# Patient Record
Sex: Male | Born: 1965 | Race: Black or African American | Hispanic: No | Marital: Single | Smoking: Former smoker
Health system: Southern US, Community
[De-identification: ages and names within clinical notes are randomized; demographics above are authoritative.]

## PROBLEM LIST (undated history)

## (undated) DIAGNOSIS — M1990 Unspecified osteoarthritis, unspecified site: Secondary | ICD-10-CM

## (undated) DIAGNOSIS — R519 Headache, unspecified: Secondary | ICD-10-CM

## (undated) DIAGNOSIS — G4730 Sleep apnea, unspecified: Secondary | ICD-10-CM

## (undated) DIAGNOSIS — R011 Cardiac murmur, unspecified: Secondary | ICD-10-CM

## (undated) DIAGNOSIS — D126 Benign neoplasm of colon, unspecified: Secondary | ICD-10-CM

## (undated) DIAGNOSIS — K429 Umbilical hernia without obstruction or gangrene: Secondary | ICD-10-CM

## (undated) DIAGNOSIS — I2121 ST elevation (STEMI) myocardial infarction involving left circumflex coronary artery: Secondary | ICD-10-CM

## (undated) DIAGNOSIS — J45909 Unspecified asthma, uncomplicated: Secondary | ICD-10-CM

## (undated) DIAGNOSIS — I2510 Atherosclerotic heart disease of native coronary artery without angina pectoris: Secondary | ICD-10-CM

## (undated) DIAGNOSIS — T7840XA Allergy, unspecified, initial encounter: Secondary | ICD-10-CM

## (undated) DIAGNOSIS — R51 Headache: Secondary | ICD-10-CM

## (undated) DIAGNOSIS — R0600 Dyspnea, unspecified: Secondary | ICD-10-CM

## (undated) DIAGNOSIS — I10 Essential (primary) hypertension: Secondary | ICD-10-CM

## (undated) DIAGNOSIS — E785 Hyperlipidemia, unspecified: Secondary | ICD-10-CM

## (undated) HISTORY — DX: Umbilical hernia without obstruction or gangrene: K42.9

## (undated) HISTORY — DX: Allergy, unspecified, initial encounter: T78.40XA

## (undated) HISTORY — DX: ST elevation (STEMI) myocardial infarction involving left circumflex coronary artery: I21.21

## (undated) HISTORY — DX: Hyperlipidemia, unspecified: E78.5

---
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
## 2001-12-20 HISTORY — PX: UMBILICAL HERNIA REPAIR: SHX196

## 2002-04-11 ENCOUNTER — Ambulatory Visit (HOSPITAL_COMMUNITY): Admission: RE | Admit: 2002-04-11 | Discharge: 2002-04-11 | Payer: Self-pay | Admitting: *Deleted

## 2003-07-30 ENCOUNTER — Emergency Department (HOSPITAL_COMMUNITY): Admission: EM | Admit: 2003-07-30 | Discharge: 2003-07-30 | Payer: Self-pay | Admitting: Emergency Medicine

## 2004-08-05 ENCOUNTER — Emergency Department (HOSPITAL_COMMUNITY): Admission: EM | Admit: 2004-08-05 | Discharge: 2004-08-06 | Payer: Self-pay | Admitting: Family Medicine

## 2007-05-14 ENCOUNTER — Emergency Department (HOSPITAL_COMMUNITY): Admission: EM | Admit: 2007-05-14 | Discharge: 2007-05-14 | Payer: Self-pay | Admitting: Emergency Medicine

## 2011-05-07 NOTE — Op Note (Signed)
[facility]  Patient:    Kenneth Long, Kenneth Long Visit Number: [id number] MRN: [id number]          Service Type: DSU Location: DAY Attending Physician:  Vikki Ports. Dictated by:   Vikki Ports, M.D. Proc. Date: 04/11/02 Admit Date:  04/11/2002                             Operative Report  PREOPERATIVE DIAGNOSIS:  Umbilical hernia.  POSTOPERATIVE DIAGNOSIS:  Umbilical hernia.  PROCEDURE:  Umbilical hernia repair.  SURGEON:  Vikki Ports, M.D.  ANESTHESIA:  General.  DESCRIPTION OF PROCEDURE:  The patient was taken to the operating room, placed in a supine position. After adequate anesthesia was induced using a laryngeal mask, the abdomen was prepped and draped in normal sterile fashion. Using a supraumbilical transverse incision, I dissected down through the skin, subcutaneous tissue onto a hernia sac. It was mobilized. Fascial edges were identified. The defect was less than 1 cm and was closed with interrupted 0 Surgilon sutures. The skin was closed with 4-0 subcuticular Monocryl. Steri-Strips and sterile dressings were applied. The patient tolerated the procedure well and went to PACU in good condition. Dictated by:   Vikki Ports, M.D. Attending Physician:  Danna Hefty R. DD:  04/11/02 TD:  04/11/02 Job: [id number]

## 2011-06-29 ENCOUNTER — Emergency Department (HOSPITAL_COMMUNITY)
Admission: EM | Admit: 2011-06-29 | Discharge: 2011-06-29 | Disposition: A | Discharge status: Home / Self Care | Payer: Self-pay | Attending: Emergency Medicine | Admitting: Emergency Medicine

## 2011-06-29 ENCOUNTER — Emergency Department (HOSPITAL_COMMUNITY): Payer: Self-pay

## 2011-06-29 DIAGNOSIS — M79609 Pain in unspecified limb: Secondary | ICD-10-CM | POA: Insufficient documentation

## 2011-06-29 DIAGNOSIS — I10 Essential (primary) hypertension: Secondary | ICD-10-CM | POA: Insufficient documentation

## 2011-12-30 ENCOUNTER — Encounter (HOSPITAL_COMMUNITY): Payer: Self-pay

## 2011-12-30 ENCOUNTER — Emergency Department (HOSPITAL_COMMUNITY)
Admission: EM | Admit: 2011-12-30 | Discharge: 2011-12-30 | Disposition: A | Discharge status: Home / Self Care | Payer: No Typology Code available for payment source | Attending: Emergency Medicine | Admitting: Emergency Medicine

## 2011-12-30 ENCOUNTER — Emergency Department (HOSPITAL_COMMUNITY): Payer: No Typology Code available for payment source

## 2011-12-30 DIAGNOSIS — S4980XA Other specified injuries of shoulder and upper arm, unspecified arm, initial encounter: Secondary | ICD-10-CM | POA: Insufficient documentation

## 2011-12-30 DIAGNOSIS — M25539 Pain in unspecified wrist: Secondary | ICD-10-CM | POA: Insufficient documentation

## 2011-12-30 DIAGNOSIS — S63509A Unspecified sprain of unspecified wrist, initial encounter: Secondary | ICD-10-CM | POA: Insufficient documentation

## 2011-12-30 DIAGNOSIS — S46909A Unspecified injury of unspecified muscle, fascia and tendon at shoulder and upper arm level, unspecified arm, initial encounter: Secondary | ICD-10-CM | POA: Insufficient documentation

## 2011-12-30 DIAGNOSIS — S63502A Unspecified sprain of left wrist, initial encounter: Secondary | ICD-10-CM

## 2011-12-30 DIAGNOSIS — M25529 Pain in unspecified elbow: Secondary | ICD-10-CM | POA: Insufficient documentation

## 2011-12-30 DIAGNOSIS — S63501A Unspecified sprain of right wrist, initial encounter: Secondary | ICD-10-CM

## 2011-12-30 DIAGNOSIS — I10 Essential (primary) hypertension: Secondary | ICD-10-CM | POA: Insufficient documentation

## 2011-12-30 DIAGNOSIS — S4991XA Unspecified injury of right shoulder and upper arm, initial encounter: Secondary | ICD-10-CM

## 2011-12-30 HISTORY — DX: Essential (primary) hypertension: I10

## 2011-12-30 MED ORDER — NAPROXEN 500 MG PO TABS: 500.0000 mg | ORAL_TABLET | Freq: Two times a day (BID) | ORAL | Status: AC

## 2011-12-30 MED ORDER — HYDROCODONE-ACETAMINOPHEN 5-325 MG PO TABS
1.0000 | ORAL_TABLET | Freq: Four times a day (QID) | ORAL | Status: AC | PRN
Start: 2011-12-30 — End: 2012-01-09
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
## 2012-05-20 ENCOUNTER — Emergency Department (HOSPITAL_COMMUNITY)
Admission: EM | Admit: 2012-05-20 | Discharge: 2012-05-20 | Disposition: A | Discharge status: Home / Self Care | Payer: Self-pay | Source: Home / Self Care | Attending: Emergency Medicine | Admitting: Emergency Medicine

## 2012-05-20 ENCOUNTER — Encounter (HOSPITAL_COMMUNITY): Payer: Self-pay | Admitting: Emergency Medicine

## 2012-05-20 DIAGNOSIS — H109 Unspecified conjunctivitis: Secondary | ICD-10-CM

## 2012-05-20 DIAGNOSIS — I10 Essential (primary) hypertension: Secondary | ICD-10-CM

## 2012-05-20 LAB — POCT I-STAT, CHEM 8
BUN: 18 mg/dL (ref 6–23)
Calcium, Ion: 1.19 mmol/L (ref 1.12–1.32)
Creatinine, Ser: 0.9 mg/dL (ref 0.50–1.35)
Glucose, Bld: 103 mg/dL — ABNORMAL HIGH (ref 70–99)
Hemoglobin: 15 g/dL (ref 13.0–17.0)
TCO2: 23 mmol/L (ref 0–100)

## 2012-05-20 MED ORDER — FUROSEMIDE 40 MG PO TABS
ORAL_TABLET | ORAL | Status: AC
  Filled 2012-05-20: qty 1

## 2012-05-20 MED ORDER — CLONIDINE HCL 0.1 MG PO TABS
ORAL_TABLET | ORAL | Status: AC
  Filled 2012-05-20: qty 2

## 2012-05-20 MED ORDER — CLONIDINE HCL 0.1 MG PO TABS
0.2000 mg | ORAL_TABLET | Freq: Once | ORAL | Status: AC
  Administered 2012-05-20: 0.2 mg via ORAL

## 2012-05-20 MED ORDER — POLYMYXIN B-TRIMETHOPRIM 10000-0.1 UNIT/ML-% OP SOLN: 1.0000 [drp] | OPHTHALMIC | Status: AC

## 2012-05-20 MED ORDER — TETRACAINE HCL 0.5 % OP SOLN
1.0000 [drp] | Freq: Once | OPHTHALMIC | Status: AC
  Administered 2012-05-20: 1 [drp] via OPHTHALMIC

## 2012-05-20 MED ORDER — CHLORTHALIDONE 25 MG PO TABS: 25.0000 mg | ORAL_TABLET | Freq: Every day | ORAL | Status: DC

## 2012-05-20 MED ORDER — ERYTHROMYCIN 5 MG/GM OP OINT: TOPICAL_OINTMENT | OPHTHALMIC | Status: AC

## 2012-05-20 MED ORDER — FUROSEMIDE 40 MG PO TABS
40.0000 mg | ORAL_TABLET | Freq: Once | ORAL | Status: AC
  Administered 2012-05-20: 40 mg via ORAL

## 2012-05-20 MED ORDER — CLONIDINE HCL 0.2 MG PO TABS: 0.2000 mg | ORAL_TABLET | Freq: Two times a day (BID) | ORAL | Status: DC

## 2012-05-20 MED ORDER — TETRACAINE HCL 0.5 % OP SOLN
OPHTHALMIC | Status: AC
  Filled 2012-05-20: qty 2

## 2012-05-20 MED ORDER — FUROSEMIDE 10 MG/ML IJ SOLN: 40.0000 mg | Freq: Once | INTRAMUSCULAR | Status: DC

## 2012-05-20 MED ORDER — CEPHALEXIN 500 MG PO CAPS: 500.0000 mg | ORAL_CAPSULE | Freq: Three times a day (TID) | ORAL | Status: AC

## 2012-05-20 NOTE — ED Notes (Signed)
Spoke with dr Lorenz Coaster, partial scripts went to [facility], part of scripts  to [facility].  Called in remaining 3 scripts to [facility] pharmacy [facility]-cephalexin, erythromycin, and trimethoprim-polymyxin ophthalmic solution.  Spoke with pharmacist frank burton.

## 2012-05-20 NOTE — ED Notes (Signed)
Eye box and equipment at bedside

## 2012-05-20 NOTE — ED Notes (Signed)
Verified order for lasix, route is "po"

## 2012-05-20 NOTE — Discharge Instructions (Signed)
Arterial Hypertension Arterial hypertension (high blood pressure) is a condition of elevated pressure in your blood vessels. Hypertension over a long period of time is a risk factor for strokes, heart attacks, and heart failure. It is also the leading cause of kidney (renal) failure.  CAUSES   In Adults -- Over 90% of all hypertension has no known cause. This is called essential or primary hypertension. In the other 10% of people with hypertension, the increase in blood pressure is caused by another disorder. This is called secondary hypertension. Important causes of secondary hypertension are:   Heavy alcohol use.   Obstructive sleep apnea.   Hyperaldosterosim (Conn's syndrome).   Steroid use.   Chronic kidney failure.   Hyperparathyroidism.   Medications.   Renal artery stenosis.   Pheochromocytoma.   Cushing's disease.   Coarctation of the aorta.   Scleroderma renal crisis.   Licorice (in excessive amounts).   Drugs (cocaine, methamphetamine).  Your caregiver can explain any items above that apply to you.  In Children -- Secondary hypertension is more common and should always be considered.   Pregnancy -- Few women of childbearing age have high blood pressure. However, up to 10% of them develop hypertension of pregnancy. Generally, this will not harm the woman. It Even be a sign of 3 complications of pregnancy: preeclampsia, HELLP syndrome, and eclampsia. Follow up and control with medication is necessary.  SYMPTOMS   This condition normally does not produce any noticeable symptoms. It is usually found during a routine exam.   Malignant hypertension is a late problem of high blood pressure. It Monger have the following symptoms:   Headaches.   Blurred vision.   End-organ damage (this means your kidneys, heart, lungs, and other organs are being damaged).   Stressful situations can increase the blood pressure. If a person with normal blood pressure has their blood  pressure go up while being seen by their caregiver, this is often termed "white coat hypertension." Its importance is not known. It Alkire be related with eventually developing hypertension or complications of hypertension.   Hypertension is often confused with mental tension, stress, and anxiety.  DIAGNOSIS  The diagnosis is made by 3 separate blood pressure measurements. They are taken at least 1 week apart from each other. If there is organ damage from hypertension, the diagnosis Lemire be made without repeat measurements. Hypertension is usually identified by having blood pressure readings:  Above 140/90 mmHg measured in both arms, at 3 separate times, over a couple weeks.   Over 130/80 mmHg should be considered a risk factor and Grisanti require treatment in patients with diabetes.  Blood pressure readings over 120/80 mmHg are called "pre-hypertension" even in non-diabetic patients. To get a true blood pressure measurement, use the following guidelines. Be aware of the factors that can alter blood pressure readings.  Take measurements at least 1 hour after caffeine.   Take measurements 30 minutes after smoking and without any stress. This is another reason to quit smoking - it raises your blood pressure.   Use a proper cuff size. Ask your caregiver if you are not sure about your cuff size.   Most home blood pressure cuffs are automatic. They will measure systolic and diastolic pressures. The systolic pressure is the pressure reading at the start of sounds. Diastolic pressure is the pressure at which the sounds disappear. If you are elderly, measure pressures in multiple postures. Try sitting, lying or standing.   Sit at rest for a minimum of   5 minutes before taking measurements.   You should not be on any medications like decongestants. These are found in many cold medications.   Record your blood pressure readings and review them with your caregiver.  If you have hypertension:  Your caregiver  may do tests to be sure you do not have secondary hypertension (see "causes" above).   Your caregiver may also look for signs of metabolic syndrome. This is also called Syndrome X or Insulin Resistance Syndrome. You may have this syndrome if you have type 2 diabetes, abdominal obesity, and abnormal blood lipids in addition to hypertension.   Your caregiver will take your medical and family history and perform a physical exam.   Diagnostic tests may include blood tests (for glucose, cholesterol, potassium, and kidney function), a urinalysis, or an EKG. Other tests may also be necessary depending on your condition.  PREVENTION  There are important lifestyle issues that you can adopt to reduce your chance of developing hypertension:  Maintain a normal weight.   Limit the amount of salt (sodium) in your diet.   Exercise often.   Limit alcohol intake.   Get enough potassium in your diet. Discuss specific advice with your caregiver.   Follow a DASH diet (dietary approaches to stop hypertension). This diet is rich in fruits, vegetables, and low-fat dairy products, and avoids certain fats.  PROGNOSIS  Essential hypertension cannot be cured. Lifestyle changes and medical treatment can lower blood pressure and reduce complications. The prognosis of secondary hypertension depends on the underlying cause. Many people whose hypertension is controlled with medicine or lifestyle changes can live a normal, healthy life.  RISKS AND COMPLICATIONS  While high blood pressure alone is not an illness, it often requires treatment due to its short- and long-term effects on many organs. Hypertension increases your risk for:  CVAs or strokes (cerebrovascular accident).   Heart failure due to chronically high blood pressure (hypertensive cardiomyopathy).   Heart attack (myocardial infarction).   Damage to the retina (hypertensive retinopathy).   Kidney failure (hypertensive nephropathy).  Your caregiver can  explain list items above that apply to you. Treatment of hypertension can significantly reduce the risk of complications. TREATMENT   For overweight patients, weight loss and regular exercise are recommended. Physical fitness lowers blood pressure.   Mild hypertension is usually treated with diet and exercise. A diet rich in fruits and vegetables, fat-free dairy products, and foods low in fat and salt (sodium) can help lower blood pressure. Decreasing salt intake decreases blood pressure in a 1/3 of people.   Stop smoking if you are a smoker.  The steps above are highly effective in reducing blood pressure. While these actions are easy to suggest, they are difficult to achieve. Most patients with moderate or severe hypertension end up requiring medications to bring their blood pressure down to a normal level. There are several classes of medications for treatment. Blood pressure pills (antihypertensives) will lower blood pressure by their different actions. Lowering the blood pressure by 10 mmHg may decrease the risk of complications by as much as 25%. The goal of treatment is effective blood pressure control. This will reduce your risk for complications. Your caregiver will help you determine the best treatment for you according to your lifestyle. What is excellent treatment for one person, may not be for you. HOME CARE INSTRUCTIONS   Do not smoke.   Follow the lifestyle changes outlined in the "Prevention" section.   If you are on medications, follow the directions   carefully. Blood pressure medications must be taken as prescribed. Skipping doses reduces their benefit. It also puts you at risk for problems.   Follow up with your caregiver, as directed.   If you are asked to monitor your blood pressure at home, follow the guidelines in the "Diagnosis" section above.  SEEK MEDICAL CARE IF:   You think you are having medication side effects.   You have recurrent headaches or lightheadedness.     You have swelling in your ankles.   You have trouble with your vision.  SEEK IMMEDIATE MEDICAL CARE IF:   You have sudden onset of chest pain or pressure, difficulty breathing, or other symptoms of a heart attack.   You have a severe headache.   You have symptoms of a stroke (such as sudden weakness, difficulty speaking, difficulty walking).  MAKE SURE YOU:   Understand these instructions.   Will watch your condition.   Will get help right away if you are not doing well or get worse.  Document Released: 12/06/2005 Document Revised: 11/25/2011 Document Reviewed: 07/06/2007 [facility] Patient Information 2012 [place], [place].Conjunctivitis Conjunctivitis is commonly called "pink eye." Conjunctivitis can be caused by bacterial or viral infection, allergies, or injuries. There is usually redness of the lining of the eye, itching, discomfort, and sometimes discharge. There may be deposits of matter along the eyelids. A viral infection usually causes a watery discharge, while a bacterial infection causes a yellowish, thick discharge. Pink eye is very contagious and spreads by direct contact. You may be given antibiotic eyedrops as part of your treatment. Before using your eye medicine, remove all drainage from the eye by washing gently with warm water and cotton balls. Continue to use the medication until you have awakened 2 mornings in a row without discharge from the eye. Do not rub your eye. This increases the irritation and helps spread infection. Use separate towels from other household members. Wash your hands with soap and water before and after touching your eyes. Use cold compresses to reduce pain and sunglasses to relieve irritation from light. Do not wear contact lenses or wear eye makeup until the infection is gone. SEEK MEDICAL CARE IF:   Your symptoms are not better after 3 days of treatment.   You have increased pain or trouble seeing.   The outer eyelids become very red or  swollen.  Document Released: 01/13/2005 Document Revised: 11/25/2011 Document Reviewed: 12/06/2005 [facility] Patient Information 2012 [place], [place].

## 2012-05-20 NOTE — ED Notes (Signed)
Right eye pain, patient reports right eye feels like something is in it. Eye sensitive to light, eye swollen

## 2012-05-20 NOTE — ED Provider Notes (Signed)
Chief Complaint  Patient presents with  . Eye Pain    History of Present Illness:   The patient is a 46 year old male who has had a two-day history of pain, swelling, redness, and discharge from his right eye. He states his vision is somewhat blurry. He thinks it might be starting to affect his left side as well. The upper and lower lids of the right eye are swollen. He denies any diplopia or pain on eye movement. He's had no fever or chills. No nasal congestion, drainage, sore throat, or cough.  He also has high blood pressure which is had for years. He was on medications for this and was seeing a specialist but stopped the medication. He's been off it for several years. He denies any headaches, dizziness, shortness of breath, or chest pain.  Review of Systems:  Other than noted above, the patient denies any of the following symptoms: Systemic:  No fever, chills, sweats, fatigue, or weight loss. Eye:  No redness, eye pain, photophobia, discharge, blurred vision, or diplopia. ENT:  No nasal congestion, rhinorrhea, or sore throat. Lymphatic:  No adenopathy. Skin:  No rash or pruritis.  PMFSH:  Past medical history, family history, social history, meds, and allergies were reviewed.  Physical Exam:   Vital signs:  BP 192/120  Pulse 81  Temp(Src) 98.6 F (37 C) (Oral)  Resp 16  SpO2 100% Filed Vitals:   05/20/12 1752 05/20/12 1829 05/20/12 1855 05/20/12 1955  BP: 201/129 205/121 210/128 192/120  Pulse: 73 81    Temp:      TempSrc:      Resp:      SpO2:       General:  Alert and in no distress. Eye:  There was swelling of the upper and lower lids. These were tender to palpation. No swelling of the periorbital tissues. Conjunctiva was injected and there was a small amount of light yellow exudate present. The cornea was intact to fluorescein staining. Anterior chambers are normal. Funduscopic exam was normal. A Tono-Pen reading was 41 for the right eye and 38 the left eye, although the  confidence level was very low for both of these readings. Visual acuity was 20/20 for the left eye, 20/25 for the right eye, and 20/20 for both eyes. ENT:  TMs and canals clear.  Nasal mucosa normal.  No intra-oral lesions, mucous membranes moist, pharynx clear. Neck:  No adenopathy tenderness or mass. Skin:  Clear, warm and dry.  Results for orders placed during the hospital encounter of 05/20/12  POCT I-STAT, CHEM 8      Component Value Range   Sodium 141  135 - 145 (mEq/L)   Potassium 3.8  3.5 - 5.1 (mEq/L)   Chloride 107  96 - 112 (mEq/L)   BUN 18  6 - 23 (mg/dL)   Creatinine, Ser 1.91  0.50 - 1.35 (mg/dL)   Glucose, Bld 478 (*) 70 - 99 (mg/dL)   Calcium, Ion 2.95  6.21 - 1.32 (mmol/L)   TCO2 23  0 - 100 (mmol/L)   Hemoglobin 15.0  13.0 - 17.0 (g/dL)   HCT 30.8  65.7 - 84.6 (%)     Course in Urgent Care Center:    he was given clonidine 0.2 mg by mouth and furosemide 40 mg by mouth. His blood pressure came down minimally. Thereafter he was given clonidine 0.2 mg again by mouth with some slight decrease in his blood pressure. It was felt that pushing more medicines tonight would  not be productive and it would be better to get him on a regular dose of medication at home and followup with a primary care doctor as soon as possible for his blood pressure.    Assessment:  The primary encounter diagnosis was Conjunctivitis. A diagnosis of Hypertension was also pertinent to this visit.  There is no evidence for orbital cellulitis. He was told however if the eye he should feel worse or wasn't improving in 48 hours to return for recheck. He was told to check in with a primary care doctor as soon as possible, preferably early next week.  Plan:   1.  The following meds were prescribed:   New Prescriptions   CEPHALEXIN (KEFLEX) 500 MG CAPSULE    Take 1 capsule (500 mg total) by mouth 3 (three) times daily.   CHLORTHALIDONE (HYGROTON) 25 MG TABLET    Take 1 tablet (25 mg total) by mouth daily.     CLONIDINE (CATAPRES) 0.2 MG TABLET    Take 1 tablet (0.2 mg total) by mouth 2 (two) times daily.   ERYTHROMYCIN OPHTHALMIC OINTMENT    Apply to right eye at bedtime.   TRIMETHOPRIM-POLYMYXIN B (POLYTRIM) OPHTHALMIC SOLUTION    Place 1 drop into the right eye every 4 (four) hours.   2.  The patient was instructed in symptomatic care and handouts were given. 3.  The patient was told to return if becoming worse in any way, if no better in 3 or 4 days, and given some red flag symptoms that would indicate earlier return.  Follow up:  The patient was told to follow up with Dr. Annamarie Major for possible glaucoma since his Tono-Pen readings were elevated, although the accuracy of the readings is in doubt. He should also followup with her primary care doctor for his blood pressure.     Reuben Likes, MD 05/20/12 2129

## 2012-05-21 NOTE — ED Notes (Signed)
Received message patient's scripts not at pharmacy.  Called and spoke to pharmacy tech.  Instructed pharmacy that scripts called in last night.  Tech acknowledged they were called. Called patient [phone] and informed him of this.  Instructed to call [facility] if any further issues.

## 2012-05-21 NOTE — ED Notes (Signed)
[facility] tech's name is [place].

## 2013-05-09 ENCOUNTER — Emergency Department (HOSPITAL_COMMUNITY)
Admission: EM | Admit: 2013-05-09 | Discharge: 2013-05-09 | Disposition: A | Discharge status: Home / Self Care | Payer: Self-pay | Attending: Emergency Medicine | Admitting: Emergency Medicine

## 2013-05-09 ENCOUNTER — Encounter (HOSPITAL_COMMUNITY): Payer: Self-pay | Admitting: Adult Health

## 2013-05-09 ENCOUNTER — Emergency Department (HOSPITAL_COMMUNITY): Payer: Self-pay

## 2013-05-09 DIAGNOSIS — N451 Epididymitis: Secondary | ICD-10-CM

## 2013-05-09 DIAGNOSIS — N453 Epididymo-orchitis: Secondary | ICD-10-CM | POA: Insufficient documentation

## 2013-05-09 DIAGNOSIS — F17200 Nicotine dependence, unspecified, uncomplicated: Secondary | ICD-10-CM | POA: Insufficient documentation

## 2013-05-09 DIAGNOSIS — I10 Essential (primary) hypertension: Secondary | ICD-10-CM | POA: Insufficient documentation

## 2013-05-09 DIAGNOSIS — R6883 Chills (without fever): Secondary | ICD-10-CM | POA: Insufficient documentation

## 2013-05-09 DIAGNOSIS — Z79899 Other long term (current) drug therapy: Secondary | ICD-10-CM | POA: Insufficient documentation

## 2013-05-09 LAB — URINALYSIS, ROUTINE W REFLEX MICROSCOPIC
Glucose, UA: NEGATIVE mg/dL
Ketones, ur: 15 mg/dL — AB
Protein, ur: 100 mg/dL — AB

## 2013-05-09 LAB — URINE MICROSCOPIC-ADD ON

## 2013-05-09 MED ORDER — CEFTRIAXONE SODIUM 250 MG IJ SOLR
250.0000 mg | Freq: Once | INTRAMUSCULAR | Status: AC
  Administered 2013-05-09: 250 mg via INTRAMUSCULAR
  Filled 2013-05-09: qty 250

## 2013-05-09 MED ORDER — HYDROCODONE-ACETAMINOPHEN 5-325 MG PO TABS
1.0000 | ORAL_TABLET | Freq: Once | ORAL | Status: AC
  Administered 2013-05-09: 1 via ORAL
  Filled 2013-05-09: qty 1

## 2013-05-09 MED ORDER — LIDOCAINE HCL (PF) 1 % IJ SOLN
INTRAMUSCULAR | Status: AC
  Administered 2013-05-09: 1 mL
  Filled 2013-05-09: qty 5

## 2013-05-09 MED ORDER — CIPROFLOXACIN HCL 500 MG PO TABS: 500.0000 mg | ORAL_TABLET | Freq: Two times a day (BID) | ORAL | Status: DC

## 2013-05-09 NOTE — ED Provider Notes (Signed)
History     CSN: [id number]  Arrival date & time 05/09/13  1737   First MD Initiated Contact with Patient 05/09/13 1826      Chief Complaint  Patient presents with  . Groin Swelling    (Consider location/radiation/quality/duration/timing/severity/associated sxs/prior treatment) HPI Comments: 47 y.o. male who has pmh of htn, who presents to the er w/ the cc of left sided testicular pain. Pt states that symptoms started 3 days ago, no n/v. Has had chills. He states that symptoms started when he woke up Sunday morning, woke him up from sleep, sudden onset. He states he has not had symptoms "this bad before", states in the past he has been treated with antibiotics for testicular pain he has had before, but states this time, "its different".   Patient is a 47 y.o. male presenting with general illness. The history is provided by the patient.  Illness Location:  Left testicle  Severity:  Mild Onset quality:  Gradual Timing:  Constant Associated symptoms: no abdominal pain, no diarrhea, no headaches, no nausea, no rash, no shortness of breath and no wheezing     Past Medical History  Diagnosis Date  . Hypertension     History reviewed. No pertinent past surgical history.  History reviewed. No pertinent family history.  History  Substance Use Topics  . Smoking status: Current Every Day Smoker  . Smokeless tobacco: Not on file  . Alcohol Use: Yes      Review of Systems  Constitutional: Negative for chills and activity change.  HENT: Negative for drooling, mouth sores and neck pain.   Eyes: Negative for pain and discharge.  Respiratory: Negative for chest tightness, shortness of breath and wheezing.   Gastrointestinal: Negative for nausea, abdominal pain, diarrhea and constipation.  Genitourinary: Positive for testicular pain. Negative for dysuria, flank pain and difficulty urinating.  Musculoskeletal: Negative for back pain and arthralgias.  Skin: Negative for color change,  pallor and rash.  Neurological: Negative for dizziness, weakness, light-headedness and headaches.  Psychiatric/Behavioral: Negative for behavioral problems, confusion and agitation.    Allergies  Peanut-containing drug products and Shellfish allergy  Home Medications   Current Outpatient Rx  Name  Route  Sig  Dispense  Refill  . chlorthalidone (HYGROTON) 25 MG tablet   Oral   Take 1 tablet (25 mg total) by mouth daily.   30 tablet   0   . cloNIDine (CATAPRES) 0.2 MG tablet   Oral   Take 1 tablet (0.2 mg total) by mouth 2 (two) times daily.   60 tablet   0   . naphazoline (CLEAR EYES) 0.012 % ophthalmic solution      1 drop 4 (four) times daily.         [name] OVER THE COUNTER MEDICATION   Oral   Take 2 tablets by mouth 3 (three) times daily as needed. As needed for pain. Pain reliever.           BP 133/83  Pulse 96  Temp(Src) 98.2 F (36.8 C) (Oral)  Resp 18  SpO2 98%  Physical Exam  Constitutional: He is oriented to person, place, and time. He appears well-developed. No distress.  HENT:  Head: Normocephalic.  Eyes: Pupils are equal, round, and reactive to light. Right eye exhibits no discharge. Left eye exhibits no discharge.  Neck: Neck supple. No tracheal deviation present.  Cardiovascular: Regular rhythm and intact distal pulses.   Pulmonary/Chest: Effort normal. No stridor. No respiratory distress. He has no wheezes.  Abdominal: Soft. He exhibits no distension. There is no tenderness. There is no rebound.  Genitourinary:  Left testicle is lower than right, swollen, ttp. Right testicle is not tender to palpation. Prostate is not tender to palpation.   Musculoskeletal: Normal range of motion. He exhibits no tenderness.  Neurological: He is alert and oriented to person, place, and time. No cranial nerve deficit.  Skin: Skin is warm. No rash noted. He is not diaphoretic. No erythema.    ED Course  Procedures (including critical care time)  Labs Reviewed   URINALYSIS, ROUTINE W REFLEX MICROSCOPIC - Abnormal; Notable for the following:    Color, Urine ORANGE (*)    APPearance TURBID (*)    Hgb urine dipstick LARGE (*)    Bilirubin Urine SMALL (*)    Ketones, ur 15 (*)    Protein, ur 100 (*)    Urobilinogen, UA 4.0 (*)    Leukocytes, UA LARGE (*)    All other components within normal limits  URINE MICROSCOPIC-ADD ON - Abnormal; Notable for the following:    Bacteria, UA MANY (*)    All other components within normal limits  GC/CHLAMYDIA PROBE AMP  URINE CULTURE  GC/CHLAMYDIA PROBE AMP   US Scrotum  05/09/2013   *RADIOLOGY REPORT*  Clinical Data:  Left testicular pain and swelling.  SCROTAL ULTRASOUND DOPPLER ULTRASOUND OF THE TESTICLES  Technique: Complete ultrasound examination of the testicles, epididymis, and other scrotal structures was performed.  Color and spectral Doppler ultrasound were also utilized to evaluate blood flow to the testicles.  Comparison:  None  Findings:  Right testis:  4.1 x 2.2 x 3.0 cm.  No evidence of testicular mass or microlithiasis.  Intratesticular blood flow is seen on color Doppler ultrasound.  Left testis:  3.6 x 2.7 x 2.7 cm.  No evidence of testicular mass or microlithiasis.  Intratesticular blood flow is seen on color Doppler ultrasound.  Right epididymis:  Normal in size and appearance.  Left epididymis:  Shows asymmetric enlargement and increased blood flow on color Doppler ultrasound, consistent with epididymitis.  Hydrocele:  A small to moderate complex hydrocele is seen on the left.  Varicocele:  A moderate-sized varicocele is also seen on the left.  Pulsed Doppler interrogation of both testes demonstrates low resistance flow bilaterally.  IMPRESSION:  1.  No evidence of testicular mass or torsion. 2.  Left sided epididymitis and moderate-sized varicocele. 3.  Small to moderate complex left hydrocele.   Original Report Authenticated By: Myles Rosenthal, M.D.   [place] Art/ven Flow Abd Pelv Doppler  05/09/2013    *RADIOLOGY REPORT*  Clinical Data:  Left testicular pain and swelling.  SCROTAL ULTRASOUND DOPPLER ULTRASOUND OF THE TESTICLES  Technique: Complete ultrasound examination of the testicles, epididymis, and other scrotal structures was performed.  Color and spectral Doppler ultrasound were also utilized to evaluate blood flow to the testicles.  Comparison:  None  Findings:  Right testis:  4.1 x 2.2 x 3.0 cm.  No evidence of testicular mass or microlithiasis.  Intratesticular blood flow is seen on color Doppler ultrasound.  Left testis:  3.6 x 2.7 x 2.7 cm.  No evidence of testicular mass or microlithiasis.  Intratesticular blood flow is seen on color Doppler ultrasound.  Right epididymis:  Normal in size and appearance.  Left epididymis:  Shows asymmetric enlargement and increased blood flow on color Doppler ultrasound, consistent with epididymitis.  Hydrocele:  A small to moderate complex hydrocele is seen on the left.  Varicocele:  A  moderate-sized varicocele is also seen on the left.  Pulsed Doppler interrogation of both testes demonstrates low resistance flow bilaterally.  IMPRESSION:  1.  No evidence of testicular mass or torsion. 2.  Left sided epididymitis and moderate-sized varicocele. 3.  Small to moderate complex left hydrocele.   Original Report Authenticated By: Myles Rosenthal, M.D.     MDM  Concern for torsion vs variocele. Will get U/S to rule out. Symptoms present for more than 3 full days now. No fevers or chills, no abdominal pain.  Will also get U/A and gc urine.   Pt is found to have epididymitis -- he does not have torsion.   Will tx pt with cipro IM 250mg  due to concern for STD due to age, and pt is given cipro for d/c. Pt is told to f/u with pcp within 2 days for resolution of symptoms -- he is given resources to help get PCP.   1. Epididymitis            Bernadene Person, MD 05/10/13 [phone]

## 2013-05-09 NOTE — ED Notes (Signed)
Resident at the bedside with pt.

## 2013-05-09 NOTE — ED Notes (Addendum)
Presents with left sided testicular pain, swelling that radiates down left leg and into abdomen associated with nasuea, began Monday suddenly. Denies penile discharge. Pt is diaphoretic, reports inguinal hernia that has never been taken of before. Reports slipping on Sunday but unsure if injured testicles at that time.  Pain is worse with movement and described as sharp and shooting.

## 2013-05-09 NOTE — ED Provider Notes (Signed)
47 year old male with history of epididymitis in the past comes in with a four-day history of pain and swelling in the left testicle which is getting progressively worse. There've been no fever or chills and no abdominal pain. On exam, he has swelling and induration and tenderness of the left testicle and epididymis consistent with epididymitis. Ultrasound is also consistent with epididymitis. He will be sent home on antibiotics and analgesics.  I saw and evaluated the patient, reviewed the resident's note and I agree with the findings and plan.   Dione Booze, MD 05/09/13 2016

## 2013-05-09 NOTE — ED Notes (Signed)
Informed by lab that there is not enough urine to add-on GC/Chlamydia. Lab reordered. MD notified of this. Patient informed that we need urine sample. Unable to void at this time but "will try." water given and patient encouraged to drink. Will continue to monitor.

## 2013-05-09 NOTE — ED Notes (Signed)
Patient in ultrasound. Family at [facility].

## 2013-05-11 LAB — GC/CHLAMYDIA PROBE AMP: CT Probe RNA: POSITIVE — AB

## 2013-05-11 LAB — URINE CULTURE: Colony Count: >=100000

## 2013-05-12 ENCOUNTER — Telehealth (HOSPITAL_COMMUNITY): Payer: Self-pay | Admitting: Emergency Medicine

## 2013-05-12 NOTE — ED Notes (Signed)
+  Chlamydia Chart sent to EDP office for review.  

## 2013-05-12 NOTE — ED Notes (Signed)
Patient has +Chlamydia. 

## 2013-05-12 NOTE — ED Notes (Signed)
Post ED Visit - Positive Culture Follow-up  Culture report reviewed by antimicrobial stewardship pharmacist: []  Wes Dulaney, Pharm.D., BCPS [x]  Celedonio Miyamoto, Pharm.D., BCPS []  Georgina Pillion, [address].D., BCPS []  [place], [address].D., BCPS, AAHIVP []  Estella Husk, Pharm.D., BCPS, AAHIV  Positive urine culture Treated with Cipro, organism sensitive to the same and no further patient follow-up is required at this time.  Kylie A Holland 05/12/2013, 4:19 PM

## 2013-05-12 NOTE — ED Notes (Signed)
Chart returned from EDP office. Per Emily West PA-C, give Azithromycin 1 gram PO once. 

## 2013-05-13 ENCOUNTER — Telehealth (HOSPITAL_COMMUNITY): Payer: Self-pay | Admitting: Emergency Medicine

## 2013-05-14 ENCOUNTER — Telehealth (HOSPITAL_COMMUNITY): Payer: Self-pay | Admitting: Emergency Medicine

## 2013-05-15 ENCOUNTER — Telehealth (HOSPITAL_COMMUNITY): Payer: Self-pay | Admitting: Emergency Medicine

## 2013-05-15 NOTE — ED Notes (Signed)
Unable to contact patient via phone. Sent letter. °

## 2013-06-30 ENCOUNTER — Telehealth (HOSPITAL_COMMUNITY): Payer: Self-pay | Admitting: Emergency Medicine

## 2013-06-30 NOTE — ED Notes (Signed)
No response to letter sent after 30 days. Chart sent to Medical Records. °

## 2015-09-22 ENCOUNTER — Emergency Department (HOSPITAL_COMMUNITY)
Admission: EM | Admit: 2015-09-22 | Discharge: 2015-09-22 | Disposition: A | Discharge status: Home / Self Care | Payer: Self-pay | Attending: Emergency Medicine | Admitting: Emergency Medicine

## 2015-09-22 ENCOUNTER — Encounter (HOSPITAL_COMMUNITY): Payer: Self-pay | Admitting: Emergency Medicine

## 2015-09-22 DIAGNOSIS — Z79899 Other long term (current) drug therapy: Secondary | ICD-10-CM | POA: Insufficient documentation

## 2015-09-22 DIAGNOSIS — M545 Low back pain, unspecified: Secondary | ICD-10-CM

## 2015-09-22 DIAGNOSIS — I10 Essential (primary) hypertension: Secondary | ICD-10-CM | POA: Insufficient documentation

## 2015-09-22 DIAGNOSIS — Z720 Tobacco use: Secondary | ICD-10-CM | POA: Insufficient documentation

## 2015-09-22 MED ORDER — KETOROLAC TROMETHAMINE 60 MG/2ML IM SOLN
60.0000 mg | Freq: Once | INTRAMUSCULAR | Status: AC
  Administered 2015-09-22: 60 mg via INTRAMUSCULAR
  Filled 2015-09-22: qty 2

## 2015-09-22 MED ORDER — HYDROMORPHONE HCL 1 MG/ML IJ SOLN
1.0000 mg | Freq: Once | INTRAMUSCULAR | Status: AC
  Administered 2015-09-22: 1 mg via INTRAMUSCULAR
  Filled 2015-09-22: qty 1

## 2015-09-22 MED ORDER — CYCLOBENZAPRINE HCL 5 MG PO TABS: 5.0000 mg | ORAL_TABLET | Freq: Three times a day (TID) | ORAL | Status: DC | PRN

## 2015-09-22 MED ORDER — CYCLOBENZAPRINE HCL 10 MG PO TABS
5.0000 mg | ORAL_TABLET | Freq: Once | ORAL | Status: AC
  Administered 2015-09-22: 5 mg via ORAL
  Filled 2015-09-22: qty 1

## 2015-09-22 MED ORDER — NAPROXEN 500 MG PO TABS: 500.0000 mg | ORAL_TABLET | Freq: Two times a day (BID) | ORAL | Status: DC

## 2015-09-22 NOTE — ED Notes (Signed)
MD Wofford aware of elevated BP, resource given to establish PCP.  Pt aware of follow up.  Pt a x 4, NAD, ambulatory.

## 2015-09-22 NOTE — ED Notes (Signed)
Pt denies any trauma to any areas.   R shoulder pain and Left hip. Right shoulder has been bothering him since January. Left hip pain started tonight. Difficulty walking. Denies numbness.

## 2015-09-22 NOTE — ED Notes (Addendum)
BP elevated. Out of BP meds. PT unable to get his meds due to contract ending with his job.

## 2015-09-22 NOTE — Discharge Instructions (Signed)
Back Pain, Adult °Low back pain is very common. About 1 in 5 people have back pain. The cause of low back pain is rarely dangerous. The pain often gets better over time. About half of people with a sudden onset of back pain feel better in just 2 weeks. About 8 in 10 people feel better by 6 weeks.  °CAUSES °Some common causes of back pain include: °· Strain of the muscles or ligaments supporting the spine. °· Wear and tear (degeneration) of the spinal discs. °· Arthritis. °· Direct injury to the back. °DIAGNOSIS °Most of the time, the direct cause of low back pain is not known. However, back pain can be treated effectively even when the exact cause of the pain is unknown. Answering your caregiver's questions about your overall health and symptoms is one of the most accurate ways to make sure the cause of your pain is not dangerous. If your caregiver needs more information, he or she may order lab work or imaging tests (X-rays or MRIs). However, even if imaging tests show changes in your back, this usually does not require surgery. °HOME CARE INSTRUCTIONS °For many people, back pain returns. Since low back pain is rarely dangerous, it is often a condition that people can learn to manage on their own.  °· Remain active. It is stressful on the back to sit or stand in one place. Do not sit, drive, or stand in one place for more than 30 minutes at a time. Take short walks on level surfaces as soon as pain allows. Try to increase the length of time you walk each day. °· Do not stay in bed. Resting more than 1 or 2 days can delay your recovery. °· Do not avoid exercise or work. Your body is made to move. It is not dangerous to be active, even though your back may hurt. Your back will likely heal faster if you return to being active before your pain is gone. °· Pay attention to your body when you  bend and lift. Many people have less discomfort when lifting if they bend their knees, keep the load close to their bodies, and  avoid twisting. Often, the most comfortable positions are those that put less stress on your recovering back. °· Find a comfortable position to sleep. Use a firm mattress and lie on your side with your knees slightly bent. If you lie on your back, put a pillow under your knees. °· Only take over-the-counter or prescription medicines as directed by your caregiver. Over-the-counter medicines to reduce pain and inflammation are often the most helpful. Your caregiver may prescribe muscle relaxant drugs. These medicines help dull your pain so you can more quickly return to your normal activities and healthy exercise. °· Put ice on the injured area. °¨ Put ice in a plastic bag. °¨ Place a towel between your skin and the bag. °¨ Leave the ice on for 15-20 minutes, 03-04 times a day for the first 2 to 3 days. After that, ice and heat may be alternated to reduce pain and spasms. °· Ask your caregiver about trying back exercises and gentle massage. This may be of some benefit. °· Avoid feeling anxious or stressed. Stress increases muscle tension and can worsen back pain. It is important to recognize when you are anxious or stressed and learn ways to manage it. Exercise is a great option. °SEEK MEDICAL CARE IF: °· You have pain that is not relieved with rest or medicine. °· You have pain that does not improve in 1 week. °· You have new symptoms. °· You are generally not feeling well. °SEEK   IMMEDIATE MEDICAL CARE IF:  °· You have pain that radiates from your back into your legs. °· You develop new bowel or bladder control problems. °· You have unusual weakness or numbness in your arms or legs. °· You develop nausea or vomiting. °· You develop abdominal pain. °· You feel faint. °Document Released: 12/06/2005 Document Revised: 06/06/2012 Document Reviewed: 04/09/2014 °ExitCare® Patient Information ©2015 [employer]. This information is not intended to replace advice given to you by your health care provider. Make sure you  discuss any questions you have with your health care provider. ° ° ° ° °Emergency Department Resource Guide °1) Find a Doctor and Pay Out of Pocket °Although you won't have to find out who is covered by your insurance plan, it is a good idea to ask around and get recommendations. You will then need to call the office and see if the doctor you have chosen will accept you as a new patient and what types of options they offer for patients who are self-pay. Some doctors offer discounts or will set up payment plans for their patients who do not have insurance, but you will need to ask so you aren't surprised when you get to your appointment. ° °2) Contact Your Local Health Department °Not all health departments have doctors that can see patients for sick visits, but many do, so it is worth a call to see if yours does. If you don't know where your local health department is, you can check in your phone book. The CDC also has a tool to help you locate your state's health department, and many state websites also have listings of all of their local health departments. ° °3) Find a Walk-in Clinic °If your illness is not likely to be very severe or complicated, you may want to try a walk in clinic. These are popping up all over the country in pharmacies, drugstores, and shopping centers. They're usually staffed by nurse practitioners or physician assistants that have been trained to treat common illnesses and complaints. They're usually fairly quick and inexpensive. However, if you have serious medical issues or chronic medical problems, these are probably not your best option. ° °No Primary Care Doctor: °- Call [facility] at  832-8000 - they can help you locate a primary care doctor that  accepts your insurance, provides certain services, etc. °- Physician Referral Service- 1-800-533-3463 ° °Chronic Pain Problems: °Organization         Address  Phone   Notes  °[facility]  (336) 297-2271 Patients need  to be referred by their primary care doctor.  ° °Medication Assistance: °Organization         Address  Phone   Notes  °[place] [facility] [address] °[place], [place] [address] (336) 641-8030 --Must be a resident of [place] °-- Must have NO insurance coverage whatsoever (no Medicaid/ Medicare, etc.) °-- The pt. MUST have a primary care doctor that directs their care regularly and follows them in the community °  °[facility]  (866) 331-1348   °[employer]  (888) 892-1162   ° °Agencies that provide inexpensive medical care: °Organization         Address  Phone   Notes  °[facility]  (336) 832-8035   °[facility]    (336) 832-7272   °[facility] [address] °[place], [place] [address] (336) 832-4777   °[facility]  [address], °[place] (336) 271-4999   °[facility]    (336) 373-0678   °[facility]    (336) 272-1050   °[facility] ° [address], [place] Phone:  (336) 832-4444, Fax:  (336) 832-4440 Hours of Operation:  9 am - 6 pm, M-F.  Also accepts Medicaid/Medicare and self-pay.  °[facility] ° [address], [place] Phone: (336) 832-3150, Fax: (336) 832-3151. Hours of Operation:  8:30 am - 5:30 pm, M-F.  Also accepts Medicaid and self-pay.  °[facility] [place] [address], [place] Phone: (336) 878-6027   °[facility] [address], [place], [place] (336)723-1848, Ext. 123 Mondays & Thursdays: 7-9 AM.  First 15 patients are seen on a first come, first serve basis. °  ° °Medicaid-accepting [place] Providers: ° °Organization         Address  Phone   Notes  °[facility] [address], [place] (336) 641-2100 Also accepts self-pay patients.  °[facility] [address], [place] ° (336) 856-9996   °[facility] [address], [place] (336) 288-8857   °[facility] [address] [place] [address], [place] (336) 299-7000   °Veita Bland [address], [place]  ° (336) 373-1557 Only accepts [place] Access Medicaid patients after they have their name applied to their card.  ° °Self-Pay (no insurance) in [place]: ° °Organization         Address  Phone   Notes  °Sickle Cell Patients, [facility] [address], [place] (336) 832-1970   °[facility] [address], [place] (336) 832-4400   ° [facility] ° [address], [place] (336) 992-4800   °[facility]/Dr. Osei-Bonsu ° [address] [place] [address], [place] or [address], [place] (336) 841-8500 Phone number for both [place] and [place] locations is the same.  °[facility] [address], [place] (336) 299-0000   °[facility] [address] [place] [address], [place] or [address] (336) 852-7530 °(336) 878-2260   °[facility] [address], [place] (336) 350-1642, phone; (336) 294-5005, fax Sees patients 1st and 3rd Saturday of every month.  Must not qualify for public or private insurance (i.e. Medicaid, Medicare, [place] Health Choice, Veterans' Benefits) • Household income should be no more than 200% of the poverty level •The clinic cannot treat you if you are pregnant or think you are pregnant • Sexually transmitted diseases are not treated at the clinic.  ° ° °Dental Care: °Organization         Address  Phone  Notes  °[facility] [facility] [address], [place] (336) 641-6152 Accepts children up to age 21 who are enrolled in Medicaid or [place] Health Choice; pregnant women with a Medicaid card; and children who have applied for Medicaid or [place] Health Choice, but were declined, whose  parents can pay a reduced fee at time of service.  °[facility] [place]  [address], [place] (336) 641-7733 Accepts children up to age 21 who are enrolled in Medicaid or [place] Health Choice; pregnant women with a Medicaid card; and children who have applied for Medicaid   or [place] Health Choice, but were declined, whose parents can pay a reduced fee at time of service.  °[facility] ° [address], [place] (336) 641-4533 Patients are seen by appointment only. Walk-ins are not accepted. [facility] will see patients 18 years of age and older. °Monday - Tuesday (8am-5pm) °Most Wednesdays (8:30-5pm) °$30 per visit, cash only  °[facility] ° [address], [place] (336) 641-4533 Patients are seen by appointment only. Walk-ins are not accepted. [facility] will see patients 18 years of age and older. °One Wednesday Evening (Monthly: Volunteer Based).  $30 per visit, cash only  °[facility]  (919) 537-3737 for adults; Children under age 4, call Graduate Pediatric Dentistry at (919) [phone]. Children aged 4-14, please call (919) [phone] to request a pediatric application. ° Dental services are provided in all areas of dental care including fillings, crowns and bridges, complete and partial dentures, implants, gum treatment, root canals, and extractions. Preventive care is also provided. Treatment is provided to both adults and children. °Patients are selected via a lottery and there is often a waiting list. °  °[facility] [address], °[place] ° (336) 763-8833 [email or web address] °  °[facility] [address], [place], [place] (336)723-1848, Ext. 123 Second and Fourth Thursday of each month, opens at 6:30 AM; Clinic ends at 9 AM.  Patients are seen on a first-come first-served basis, and a limited number are seen during each clinic.  ° °[facility] °  [address], [place], [place] (336) 723-7904   Eligibility Requirements °You must have lived in [place], [place], or [place] counties for at least the last three months. °  You cannot be eligible for state or federal sponsored healthcare insurance, including Veterans Administration, Medicaid, or Medicare. °  You generally cannot be eligible for healthcare insurance through your employer.  °  How to apply: °Eligibility screenings are held every Tuesday and Wednesday afternoon from 1:00 pm until 4:00 pm. You do not need an appointment for the interview!  °[facility] [address], [place], [place] 336-631-2330   °[facility]  336-342-8273   °[facility]  336-703-3100   °[facility]  336-570-6415   ° °Behavioral Health Resources in the Community: °Intensive Outpatient Programs °Organization         Address  Phone  Notes  °[facility] [address], [place], [place] 336-878-6098   °[facility] [address], [place], [place] 336-832-9800   °[facility] [address], [place], [place] ° 336-882-2125   °[facility] [address],  °[place],  1-800-853-5163 or 336-641-4981   °Substance Abuse Resources °Organization         Address  Phone  Notes  °[facility]  336-882-2125   °[facility]  336-784-9470   °[facility]  336-285-9073   °[facility]  336-845-3988   °Residential & Outpatient Substance Abuse Program  1-800-659-3381   °Psychological Services °Organization         Address  Phone  Notes  ° Health  336- 832-9600   °[facility]  336- 378-7881   °[facility] [address], [place] 1-800-853-5163 or 336-641-4981   ° °Mobile Crisis Teams °Organization         Address  Phone    Notes  °[facility], Mobile Crisis Care Unit  1-877-626-1772     °Assertive °[facility] ° [address] [place], [place] 336-834-9664   °Sharon DeEsch [address] °[place] 336-554-5454   ° °Self-Help/Support Groups °Organization         Address  Phone             Notes  °[facility] [place] - variety of support groups  336- 373-1402 Call for more information  °Narcotics Anonymous (NA), [facility] [address], °[place]  2 meetings at this location  ° °Residential Treatment Programs °Organization         Address  Phone  Notes  °[facility] [address],    °[place]  1-866-801-8205   °[facility] ° [address], [place], [place] 704-293-8524   °[facility] [address], [place] 336-845-3988 Admissions: 8am-3pm M-F  °[facility] [address]    °[place], [place] 336-841-1104   °[facility] [address], [place], [place] 336-379-7146   °[facility] [address]  °[place], [place] 336-285-9073   °[facility] Intensive Outpatient [address], [place], [place] 336-852-3033   °[facility] [address]  °[place], [place] 1-877-615-2722 or 336-784-9470   °[facility] [address] [place], [place] 336-227-7417 Accepts Medicaid  °[facility] [address]  °[place] 1-800-659-3381 Substance Abuse/Addiction Treatment  ° °[place] Behavioral Health Resources °Organization         Address  Phone  Notes  °[facility]  (888) 581-9988   °Julie Brannon, PhD [address] [place], [place]   (336) 349-5553 or (336) 951-0000   °[facility]   [address] °[place], [place] (336) 349-4454   °[facility] [address], [place], [place] (336) 342-8316 Insurance/Medicaid/sponsorship through [facility]  °[facility] [address]                                     [place], [place] (336) 342-8316 Therapy/tele-psych/case  °[facility] [address]  ° [place], [place] (336) 349-2233    °Dr. Arfeen  (336) 349-4544   °[facility] [place]  [employer] [place] [facility] 1) [address], [place] °2) [address], [place] °3)  [address], [place] (336) 349-3220 °(336) 342-7768 ° °(336) 342-8140   °[place] [facility] (336) 342-1394 or (336) 342-3537 (After Hours)    ° ° ° ° °

## 2015-09-22 NOTE — ED Notes (Signed)
C/o joint pain all over.  R shoulder and L hip are the worse. Denies fever.

## 2015-09-22 NOTE — ED Provider Notes (Signed)
CSN: [id number]     Arrival date & time 09/22/15  8466 History   First MD Initiated Contact with Patient 09/22/15 0535     Chief Complaint  Patient presents with  . Joint Pain     (Consider location/radiation/quality/duration/timing/severity/associated sxs/prior Treatment) Patient is a 49 y.o. male presenting with back pain.  Back Pain Location:  Lumbar spine Quality:  Shooting Radiates to:  L posterior upper leg Pain severity:  Severe Onset quality:  Sudden Duration: a few hours. Timing:  Constant Progression:  Unchanged Chronicity:  New Context: not lifting heavy objects and not recent injury   Context comment:  Pain started when he tried to get out of bed to go to the bathroom.  Relieved by:  Nothing Worsened by:  Movement Ineffective treatments:  None tried Associated symptoms: no bladder incontinence, no bowel incontinence, no fever, no numbness, no perianal numbness, no tingling and no weakness     Past Medical History  Diagnosis Date  . Hypertension    History reviewed. No pertinent past surgical history. No family history on file. Social History  Substance Use Topics  . Smoking status: Current Every Day Smoker  . Smokeless tobacco: None  . Alcohol Use: Yes    Review of Systems  Constitutional: Negative for fever.  Gastrointestinal: Negative for bowel incontinence.  Genitourinary: Negative for bladder incontinence.  Musculoskeletal: Positive for back pain.  Neurological: Negative for tingling, weakness and numbness.  All other systems reviewed and are negative.     Allergies  Peanut-containing drug products and Shellfish allergy  Home Medications   Prior to Admission medications   Medication Sig Start Date End Date Taking? Authorizing Provider  cloNIDine (CATAPRES) 0.2 MG tablet Take 0.2 mg by mouth daily. 05/20/12 09/22/15 Yes Harden Mo, MD  ibuprofen (ADVIL,MOTRIN) 200 MG tablet Take 200 mg by mouth every 6 (six) hours as needed for pain. For  pain   Yes Historical Provider, MD   BP 171/110 mmHg  Pulse 97  Temp(Src) 98.3 F (36.8 C) (Oral)  Resp 20  Ht 5\' 9"  (1.753 m)  Wt 160 lb (72.576 kg)  BMI 23.62 kg/m2  SpO2 98% Physical Exam  Constitutional: He is oriented to person, place, and time. He appears well-developed and well-nourished. No distress.  HENT:  Head: Normocephalic and atraumatic.  Eyes: Conjunctivae are normal. No scleral icterus.  Neck: Neck supple.  Cardiovascular: Normal rate and intact distal pulses.   Pulmonary/Chest: Effort normal. No stridor. No respiratory distress.  Abdominal: Normal appearance. He exhibits no distension.  Musculoskeletal:       Legs: Neurological: He is alert and oriented to person, place, and time.  Normal BLE strength and sensation  Skin: Skin is warm and dry. No rash noted.  Psychiatric: He has a normal mood and affect. His behavior is normal.  Nursing note and vitals reviewed.   ED Course  Procedures (including critical care time) Labs Review Labs Reviewed - No data to display  Imaging Review No results found. I have personally reviewed and evaluated these images and lab results as part of my medical decision-making.   EKG Interpretation None      MDM   Final diagnoses:  Left-sided low back pain without sciatica    Left sided low back pain.  Negative straight leg raise test.  No red flag signs or symptoms to suggest acute spinal pathology.  IM toradol, IM dilaudid, and PO flexeril for symptom control.  Plan muscle relaxers and NSAIDs on DC.  Advised PCP follow up for his chronic hypertension.      Serita Grit, MD 09/22/15 [phone]

## 2015-09-22 NOTE — ED Notes (Signed)
Pt placed in a gown and hooked up to the monitor with the BP cuff and pulse ox 

## 2015-09-30 ENCOUNTER — Ambulatory Visit: Payer: Self-pay | Admitting: Family Medicine

## 2015-12-05 ENCOUNTER — Emergency Department (HOSPITAL_COMMUNITY)
Admission: EM | Admit: 2015-12-05 | Discharge: 2015-12-05 | Disposition: A | Discharge status: Home / Self Care | Payer: Self-pay | Attending: Emergency Medicine | Admitting: Emergency Medicine

## 2015-12-05 ENCOUNTER — Emergency Department (HOSPITAL_COMMUNITY): Payer: Self-pay

## 2015-12-05 ENCOUNTER — Encounter (HOSPITAL_COMMUNITY): Payer: Self-pay | Admitting: Emergency Medicine

## 2015-12-05 DIAGNOSIS — M25851 Other specified joint disorders, right hip: Secondary | ICD-10-CM | POA: Insufficient documentation

## 2015-12-05 DIAGNOSIS — M25551 Pain in right hip: Secondary | ICD-10-CM

## 2015-12-05 DIAGNOSIS — F17210 Nicotine dependence, cigarettes, uncomplicated: Secondary | ICD-10-CM | POA: Insufficient documentation

## 2015-12-05 DIAGNOSIS — I10 Essential (primary) hypertension: Secondary | ICD-10-CM | POA: Insufficient documentation

## 2015-12-05 DIAGNOSIS — Z79899 Other long term (current) drug therapy: Secondary | ICD-10-CM | POA: Insufficient documentation

## 2015-12-05 MED ORDER — MELOXICAM 7.5 MG PO TABS: 7.5000 mg | ORAL_TABLET | Freq: Every day | ORAL | Status: DC

## 2015-12-05 MED ORDER — LISINOPRIL 20 MG PO TABS
20.0000 mg | ORAL_TABLET | ORAL | Status: AC
  Administered 2015-12-05: 20 mg via ORAL
  Filled 2015-12-05: qty 1

## 2015-12-05 MED ORDER — HYDROCHLOROTHIAZIDE 25 MG PO TABS
25.0000 mg | ORAL_TABLET | Freq: Every day | ORAL | Status: DC
  Administered 2015-12-05: 25 mg via ORAL
  Filled 2015-12-05: qty 1

## 2015-12-05 MED ORDER — KETOROLAC TROMETHAMINE 60 MG/2ML IM SOLN
60.0000 mg | Freq: Once | INTRAMUSCULAR | Status: AC
  Administered 2015-12-05: 60 mg via INTRAMUSCULAR
  Filled 2015-12-05: qty 2

## 2015-12-05 MED ORDER — LISINOPRIL-HYDROCHLOROTHIAZIDE 20-25 MG PO TABS: 1.0000 | ORAL_TABLET | Freq: Every day | ORAL | Status: DC

## 2015-12-05 NOTE — ED Provider Notes (Signed)
CSN: [id number]     Arrival date & time 12/05/15  [id number] History   First MD Initiated Contact with Patient 12/05/15 [phone]     Chief Complaint  Patient presents with  . Leg Pain     (Consider location/radiation/quality/duration/timing/severity/associated sxs/prior Treatment) The history is provided by the patient.     Pt with hx HTN p/w right anterior thigh pain that began several months ago.  States the pain began while he was working at the [employer] walking up and down stairs with a heavy pack on.  The pain is constant but worse with activity.  Has taken 600mg  ibuprofen qHS for pain without significant relief.  Denies fevers, abdominal pain, testicular pain or swelling, weakness or numbness of the legs, urinary symptoms.  Has had intermittent diarrhea but only with drinking milk.  Denies falls or other injury.   Past Medical History  Diagnosis Date  . Hypertension    History reviewed. No pertinent past surgical history. History reviewed. No pertinent family history. Social History  Substance Use Topics  . Smoking status: Current Every Day Smoker    Types: Cigarettes  . Smokeless tobacco: None  . Alcohol Use: Yes    Review of Systems  All other systems reviewed and are negative.     Allergies  Peanut-containing drug products and Shellfish allergy  Home Medications   Prior to Admission medications   Medication Sig Start Date End Date Taking? Authorizing Provider  ibuprofen (ADVIL,MOTRIN) 200 MG tablet Take 600 mg by mouth every 6 (six) hours as needed for mild pain. For pain   Yes Historical Provider, MD  lisinopril-hydrochlorothiazide (PRINZIDE,ZESTORETIC) 20-25 MG tablet Take 1 tablet by mouth daily.   Yes Historical Provider, MD  cyclobenzaprine (FLEXERIL) 5 MG tablet Take 1 tablet (5 mg total) by mouth 3 (three) times daily as needed for muscle spasms. Patient not taking: Reported on 12/05/2015 09/22/15   Serita Grit, MD  naproxen (NAPROSYN) 500 MG tablet Take 1 tablet  (500 mg total) by mouth 2 (two) times daily. Patient not taking: Reported on 12/05/2015 09/22/15   Serita Grit, MD   BP 166/107 mmHg  Temp(Src) 98 F (36.7 C) (Oral)  Resp 15  Ht 5\' 11"  (1.803 m)  Wt 76.658 kg  BMI 23.58 kg/m2  SpO2 98% Physical Exam  Constitutional: He appears well-developed and well-nourished. No distress.  HENT:  Head: Normocephalic and atraumatic.  Neck: Neck supple.  Pulmonary/Chest: Effort normal.  Abdominal: Soft. He exhibits no distension. There is no tenderness. There is no rebound and no guarding.  Genitourinary:  No right inguinal adenopathy  Musculoskeletal:       Legs: Some increased pain with ROM of right hip, particularly with abduction.  No erythema, edema, warmth associated with the hip.  No rash.    Neurological: He is alert.  Skin: He is not diaphoretic.  Nursing note and vitals reviewed.   ED Course  Procedures (including critical care time) Labs Review Labs Reviewed - No data to display  Imaging Review Dg Hip Unilat With Pelvis 2-3 Views Right  12/05/2015  CLINICAL DATA:  Right hip pain for several months EXAM: DG HIP (WITH OR WITHOUT PELVIS) 2-3V RIGHT COMPARISON:  None. FINDINGS: There is no evidence of hip fracture or dislocation. The joint spaces are relatively well maintained. There is osseous prominence of the superior femoral head neck junction bilaterally as can be seen with femoroacetabular impingement. IMPRESSION: No acute osseous injury of the right hip. Electronically Signed   By: Elbert Ewings  Patel   On: 12/05/2015 10:08   I have personally reviewed and evaluated these images and lab results as part of my medical decision-making.   EKG Interpretation None      MDM   Final diagnoses:  Right hip pain  Femoroacetabular impingement of right hip   Afebrile, nontoxic patient with right hip pain x several months.  Xrays shows bilateral anatomical variants that may indicate femoroacetabular impingement. With the work pt does with  overuse of hip flexors and repeated heavy lifting and stair climbing, suspect injury to right hip joint associated with this. No e/o septic joint.  Doubt radiculopathy, intraabdominal cause of this pain.  D/C home with mobic, orthopedic follow up, refill of him HTN medication - advised to follow up for recheck.  Pt initially HTN, had not taken morning medication yet.  Discussed result, findings, treatment, and follow up  with patient.  Pt given return precautions.  Pt verbalizes understanding and agrees with plan.         Clayton Bibles, PA-C 12/05/15 1323  Julianne Rice, MD 12/09/15 [phone]

## 2015-12-05 NOTE — Discharge Instructions (Signed)
Read the information below.  Use the prescribed medication as directed.  Please discuss all new medications with your pharmacist.  You may return to the Emergency Department at any time for worsening condition or any new symptoms that concern you.    If you develop uncontrolled pain, weakness or numbness of the extremity, severe discoloration of the skin, or you are unable to walk or move your right leg, return to the ER for a recheck.      Hip Pain Your hip is the joint between your upper legs and your lower pelvis. The bones, cartilage, tendons, and muscles of your hip joint perform a lot of work each day supporting your body weight and allowing you to move around. Hip pain can range from a minor ache to severe pain in one or both of your hips. Pain may be felt on the inside of the hip joint near the groin, or the outside near the buttocks and upper thigh. You may have swelling or stiffness as well.  HOME CARE INSTRUCTIONS   Take medicines only as directed by your health care provider.  Apply ice to the injured area:  Put ice in a plastic bag.  Place a towel between your skin and the bag.  Leave the ice on for 15-20 minutes at a time, 3-4 times a day.  Keep your leg raised (elevated) when possible to lessen swelling.  Avoid activities that cause pain.  Follow specific exercises as directed by your health care provider.  Sleep with a pillow between your legs on your most comfortable side.  Record how often you have hip pain, the location of the pain, and what it feels like. SEEK MEDICAL CARE IF:   You are unable to put weight on your leg.  Your hip is red or swollen or very tender to touch.  Your pain or swelling continues or worsens after 1 week.  You have increasing difficulty walking.  You have a fever. SEEK IMMEDIATE MEDICAL CARE IF:   You have fallen.  You have a sudden increase in pain and swelling in your hip. MAKE SURE YOU:   Understand these  instructions.  Will watch your condition.  Will get help right away if you are not doing well or get worse.   This information is not intended to replace advice given to you by your health care provider. Make sure you discuss any questions you have with your health care provider.   Document Released: 05/26/2010 Document Revised: 12/27/2014 Document Reviewed: 08/02/2013 Elsevier Interactive Patient Education [employer].

## 2015-12-05 NOTE — ED Notes (Addendum)
Pt states he has right sided leg pain that sometimes goes into his right flank going on for several months. Pt states he is occasionally nauseated with the pain.  Denies urinary symptoms.

## 2016-01-30 ENCOUNTER — Other Ambulatory Visit (HOSPITAL_COMMUNITY): Payer: Self-pay | Admitting: Sports Medicine

## 2016-01-30 DIAGNOSIS — M25551 Pain in right hip: Secondary | ICD-10-CM

## 2016-03-08 ENCOUNTER — Ambulatory Visit (HOSPITAL_COMMUNITY): Admission: RE | Admit: 2016-03-08 | Payer: Self-pay | Source: Ambulatory Visit

## 2016-03-09 ENCOUNTER — Ambulatory Visit (INDEPENDENT_AMBULATORY_CARE_PROVIDER_SITE_OTHER): Payer: Self-pay | Admitting: Internal Medicine

## 2016-03-09 ENCOUNTER — Encounter: Payer: Self-pay | Admitting: Internal Medicine

## 2016-03-09 VITALS — BP 150/98 | HR 92 | Ht 68.0 in | Wt 180.0 lb

## 2016-03-09 DIAGNOSIS — K029 Dental caries, unspecified: Secondary | ICD-10-CM

## 2016-03-09 DIAGNOSIS — M1610 Unilateral primary osteoarthritis, unspecified hip: Secondary | ICD-10-CM | POA: Insufficient documentation

## 2016-03-09 DIAGNOSIS — M1990 Unspecified osteoarthritis, unspecified site: Secondary | ICD-10-CM

## 2016-03-09 DIAGNOSIS — H547 Unspecified visual loss: Secondary | ICD-10-CM

## 2016-03-09 DIAGNOSIS — I10 Essential (primary) hypertension: Secondary | ICD-10-CM

## 2016-03-09 MED ORDER — LISINOPRIL-HYDROCHLOROTHIAZIDE 20-25 MG PO TABS: 1.0000 | ORAL_TABLET | Freq: Every day | ORAL | Status: DC

## 2016-03-09 MED ORDER — AMLODIPINE BESYLATE 10 MG PO TABS: 10.0000 mg | ORAL_TABLET | Freq: Every day | ORAL | Status: DC

## 2016-03-09 NOTE — Progress Notes (Signed)
   Subjective:    Patient ID: Kenneth Long, male    DOB: 25-Sep-1966, 50 y.o.   MRN: [id number]  HPI   1.  Essential Hypertension:  MOre than 10 years ago.  Has not been good with taking medication.  Has been on Lisinopril/HCTZ for past 3 months, but out for 3 days.  Has since refilled.  2.  Right hip pain:  Followed by Dr. Paulla Fore at [facility].  No longer taking the Cyclobenzaprine and Ibuprofen or Meloxicam.  Had a steroid injection into hip 1 month ago.  Is taking ?prednisone and Tramadol.    3.  Dental pain:  Does not have pain.  Needs dental work.  Does have a partial upper.  Anchor teeth are loose, unfortunately.  He does brush twice a day and gargles with Scope.     Meds: 1.  Lisinopril/HCTZ 20/25 mg once daily 2.  Tramadol 50 mg at bedtime as needed 3.  ?prednisone, unknown dose  Allergies  Allergen Reactions  . Peanut-Containing Drug Products Anaphylaxis and Swelling  . Shellfish Allergy Anaphylaxis and Swelling   Past Medical History  Diagnosis Date  . Hypertension   . Inguinal hernia   . Heart murmur   . Asthma    Past Surgical History Umbilical Hernia Repair [id number]  Review of Systems     Objective:   Physical Exam NAD HEENT:  PERRL, EOMI, discs sharp, TMs pearly gray.  Significant dental decay, particularly anchor teeth for bridge, upper.  Throat without injection Neck:  Supple, no adenopathy, no thyromegaly. Chest:  CTA CV:  RRR with normal S1 and S2, no S3 or S4.  No carotid bruits, Carotid, radial pulses normal and equal Abd:  S, NT, No HSM or mass, +BS Extrems:  No edema       Assessment & Plan:  1.  Essential Hypertension:  Discussed at length need to take medication regularly.  Has only been on Lisinopril/HCTZ for 3 days after off for a bit.  2.  Dental Decay:  Referral to dental clinic  3.  Decreased Visual acuity:  Pt. Brings this up during visit--would like to get eyes checked.    4.  Right Hip pain:  Needs MRI of hip.  Will see  if can set up through orange card.

## 2016-03-10 LAB — BASIC METABOLIC PANEL
BUN / CREAT RATIO: 11 (ref 9–20)
BUN: 11 mg/dL (ref 6–24)
CO2: 23 mmol/L (ref 18–29)
CREATININE: 1.02 mg/dL (ref 0.76–1.27)
Calcium: 9.8 mg/dL (ref 8.7–10.2)
Chloride: 98 mmol/L (ref 96–106)
GFR, EST AFRICAN AMERICAN: 100 mL/min/{1.73_m2} (ref >59)
GFR, EST NON AFRICAN AMERICAN: 87 mL/min/{1.73_m2} (ref >59)
Glucose: 100 mg/dL — ABNORMAL HIGH (ref 65–99)
POTASSIUM: 4.1 mmol/L (ref 3.5–5.2)
SODIUM: 140 mmol/L (ref 134–144)

## 2016-03-12 ENCOUNTER — Ambulatory Visit (HOSPITAL_COMMUNITY)
Admission: RE | Admit: 2016-03-12 | Discharge: 2016-03-12 | Disposition: A | Discharge status: Home / Self Care | Payer: Self-pay | Source: Ambulatory Visit | Attending: Sports Medicine | Admitting: Sports Medicine

## 2016-03-12 DIAGNOSIS — M25551 Pain in right hip: Secondary | ICD-10-CM | POA: Insufficient documentation

## 2016-03-16 ENCOUNTER — Encounter (HOSPITAL_COMMUNITY)
Admission: EM | Disposition: A | Discharge status: Home / Self Care | Payer: Self-pay | Source: Home / Self Care | Attending: Cardiology

## 2016-03-16 ENCOUNTER — Emergency Department (HOSPITAL_COMMUNITY): Payer: Self-pay

## 2016-03-16 ENCOUNTER — Inpatient Hospital Stay (HOSPITAL_COMMUNITY)
Admission: EM | Admit: 2016-03-16 | Discharge: 2016-03-18 | DRG: 282 | Disposition: A | Discharge status: Home / Self Care | Payer: Self-pay | Attending: Cardiology | Admitting: Cardiology

## 2016-03-16 ENCOUNTER — Encounter (HOSPITAL_COMMUNITY): Payer: Self-pay | Admitting: Family Medicine

## 2016-03-16 DIAGNOSIS — Z8249 Family history of ischemic heart disease and other diseases of the circulatory system: Secondary | ICD-10-CM

## 2016-03-16 DIAGNOSIS — M1611 Unilateral primary osteoarthritis, right hip: Secondary | ICD-10-CM | POA: Diagnosis present

## 2016-03-16 DIAGNOSIS — I2121 ST elevation (STEMI) myocardial infarction involving left circumflex coronary artery: Secondary | ICD-10-CM | POA: Diagnosis present

## 2016-03-16 DIAGNOSIS — I10 Essential (primary) hypertension: Secondary | ICD-10-CM | POA: Diagnosis present

## 2016-03-16 DIAGNOSIS — I493 Ventricular premature depolarization: Secondary | ICD-10-CM | POA: Diagnosis present

## 2016-03-16 DIAGNOSIS — I2510 Atherosclerotic heart disease of native coronary artery without angina pectoris: Secondary | ICD-10-CM | POA: Diagnosis present

## 2016-03-16 DIAGNOSIS — G8929 Other chronic pain: Secondary | ICD-10-CM | POA: Diagnosis present

## 2016-03-16 DIAGNOSIS — D72829 Elevated white blood cell count, unspecified: Secondary | ICD-10-CM | POA: Diagnosis present

## 2016-03-16 DIAGNOSIS — F17200 Nicotine dependence, unspecified, uncomplicated: Secondary | ICD-10-CM | POA: Diagnosis present

## 2016-03-16 DIAGNOSIS — F1010 Alcohol abuse, uncomplicated: Secondary | ICD-10-CM | POA: Diagnosis present

## 2016-03-16 DIAGNOSIS — I249 Acute ischemic heart disease, unspecified: Secondary | ICD-10-CM

## 2016-03-16 DIAGNOSIS — Z955 Presence of coronary angioplasty implant and graft: Secondary | ICD-10-CM

## 2016-03-16 DIAGNOSIS — Z91013 Allergy to seafood: Secondary | ICD-10-CM

## 2016-03-16 DIAGNOSIS — Z79899 Other long term (current) drug therapy: Secondary | ICD-10-CM

## 2016-03-16 DIAGNOSIS — Z91010 Allergy to peanuts: Secondary | ICD-10-CM

## 2016-03-16 DIAGNOSIS — E876 Hypokalemia: Secondary | ICD-10-CM | POA: Diagnosis not present

## 2016-03-16 DIAGNOSIS — I2129 ST elevation (STEMI) myocardial infarction involving other sites: Principal | ICD-10-CM | POA: Diagnosis present

## 2016-03-16 HISTORY — DX: ST elevation (STEMI) myocardial infarction involving left circumflex coronary artery: I21.21

## 2016-03-16 HISTORY — DX: Unspecified asthma, uncomplicated: J45.909

## 2016-03-16 HISTORY — DX: Cardiac murmur, unspecified: R01.1

## 2016-03-16 HISTORY — PX: CARDIAC CATHETERIZATION: SHX172

## 2016-03-16 LAB — CBC
HCT: 40.7 % (ref 39.0–52.0)
HCT: 38.1 % — ABNORMAL LOW (ref 39.0–52.0)
Hemoglobin: 13.6 g/dL (ref 13.0–17.0)
Hemoglobin: 12.9 g/dL — ABNORMAL LOW (ref 13.0–17.0)
MCH: 29.6 pg (ref 26.0–34.0)
MCH: 29.8 pg (ref 26.0–34.0)
MCHC: 33.4 g/dL (ref 30.0–36.0)
MCHC: 33.9 g/dL (ref 30.0–36.0)
MCV: 88.7 fL (ref 78.0–100.0)
MCV: 88 fL (ref 78.0–100.0)
PLATELETS: 357 10*3/uL (ref 150–400)
PLATELETS: 346 10*3/uL (ref 150–400)
RBC: 4.59 MIL/uL (ref 4.22–5.81)
RBC: 4.33 MIL/uL (ref 4.22–5.81)
RDW: 13.2 % (ref 11.5–15.5)
RDW: 13.3 % (ref 11.5–15.5)
WBC: 15.5 10*3/uL — AB (ref 4.0–10.5)
WBC: 13.5 10*3/uL — AB (ref 4.0–10.5)

## 2016-03-16 LAB — BASIC METABOLIC PANEL
Anion gap: 16 — ABNORMAL HIGH (ref 5–15)
BUN: 8 mg/dL (ref 6–20)
CALCIUM: 10.7 mg/dL — AB (ref 8.9–10.3)
CO2: 23 mmol/L (ref 22–32)
CREATININE: 1.02 mg/dL (ref 0.61–1.24)
Chloride: 98 mmol/L — ABNORMAL LOW (ref 101–111)
Glucose, Bld: 125 mg/dL — ABNORMAL HIGH (ref 65–99)
Potassium: 3.4 mmol/L — ABNORMAL LOW (ref 3.5–5.1)
SODIUM: 137 mmol/L (ref 135–145)

## 2016-03-16 LAB — I-STAT TROPONIN, ED: TROPONIN I, POC: 14.79 ng/mL — AB (ref 0.00–0.08)

## 2016-03-16 LAB — CREATININE, SERUM: CREATININE: 1.1 mg/dL (ref 0.61–1.24)

## 2016-03-16 LAB — POCT ACTIVATED CLOTTING TIME: ACTIVATED CLOTTING TIME: 415 s

## 2016-03-16 LAB — TROPONIN I: Troponin I: >65 ng/mL (ref <0.031)

## 2016-03-16 LAB — HEPARIN LEVEL (UNFRACTIONATED)

## 2016-03-16 LAB — MRSA PCR SCREENING: MRSA BY PCR: NEGATIVE

## 2016-03-16 SURGERY — LEFT HEART CATH AND CORONARY ANGIOGRAPHY

## 2016-03-16 MED ORDER — METHYLPREDNISOLONE SODIUM SUCC 125 MG IJ SOLR
125.0000 mg | Freq: Once | INTRAMUSCULAR | Status: AC
  Administered 2016-03-16: 125 mg via INTRAVENOUS
  Filled 2016-03-16: qty 2

## 2016-03-16 MED ORDER — MIDAZOLAM HCL 2 MG/2ML IJ SOLN
INTRAMUSCULAR | Status: DC | PRN
  Administered 2016-03-16: 1 mg via INTRAVENOUS

## 2016-03-16 MED ORDER — HEPARIN SODIUM (PORCINE) 5000 UNIT/ML IJ SOLN
INTRAMUSCULAR | Status: AC
  Filled 2016-03-16: qty 1

## 2016-03-16 MED ORDER — SODIUM CHLORIDE 0.9 % WEIGHT BASED INFUSION: 3.0000 mL/kg/h | INTRAVENOUS | Status: AC

## 2016-03-16 MED ORDER — LIDOCAINE HCL (PF) 1 % IJ SOLN
INTRAMUSCULAR | Status: DC | PRN
  Administered 2016-03-16: 5 mL

## 2016-03-16 MED ORDER — SODIUM CHLORIDE 0.9% FLUSH: 3.0000 mL | INTRAVENOUS | Status: DC | PRN

## 2016-03-16 MED ORDER — ATORVASTATIN CALCIUM 80 MG PO TABS
80.0000 mg | ORAL_TABLET | Freq: Every day | ORAL | Status: DC
  Administered 2016-03-16 – 2016-03-17 (×2): 80 mg via ORAL
  Filled 2016-03-16 (×2): qty 1

## 2016-03-16 MED ORDER — HEPARIN BOLUS VIA INFUSION
4000.0000 [IU] | Freq: Once | INTRAVENOUS | Status: AC
  Administered 2016-03-16: 4000 [IU] via INTRAVENOUS
  Filled 2016-03-16: qty 4000

## 2016-03-16 MED ORDER — DIPHENHYDRAMINE HCL 50 MG/ML IJ SOLN
25.0000 mg | Freq: Four times a day (QID) | INTRAMUSCULAR | Status: DC | PRN
  Administered 2016-03-16: 25 mg via INTRAVENOUS
  Filled 2016-03-16: qty 1

## 2016-03-16 MED ORDER — TICAGRELOR 90 MG PO TABS
90.0000 mg | ORAL_TABLET | Freq: Two times a day (BID) | ORAL | Status: DC
  Administered 2016-03-16 – 2016-03-18 (×4): 90 mg via ORAL
  Filled 2016-03-16 (×4): qty 1

## 2016-03-16 MED ORDER — VERAPAMIL HCL 2.5 MG/ML IV SOLN
INTRAVENOUS | Status: AC
  Filled 2016-03-16: qty 2

## 2016-03-16 MED ORDER — HEPARIN (PORCINE) IN NACL 100-0.45 UNIT/ML-% IJ SOLN: 12.0000 [IU]/kg/h | INTRAMUSCULAR | Status: DC

## 2016-03-16 MED ORDER — HEPARIN SODIUM (PORCINE) 5000 UNIT/ML IJ SOLN: 4000.0000 [IU] | Freq: Once | INTRAMUSCULAR | Status: DC

## 2016-03-16 MED ORDER — ONDANSETRON HCL 4 MG/2ML IJ SOLN: 4.0000 mg | Freq: Four times a day (QID) | INTRAMUSCULAR | Status: DC | PRN

## 2016-03-16 MED ORDER — NITROGLYCERIN 1 MG/10 ML FOR IR/CATH LAB
INTRA_ARTERIAL | Status: AC
  Filled 2016-03-16: qty 10

## 2016-03-16 MED ORDER — NITROGLYCERIN 1 MG/10 ML FOR IR/CATH LAB
INTRA_ARTERIAL | Status: DC | PRN
  Administered 2016-03-16: 200 ug

## 2016-03-16 MED ORDER — ACETAMINOPHEN 325 MG PO TABS: 650.0000 mg | ORAL_TABLET | ORAL | Status: DC | PRN

## 2016-03-16 MED ORDER — ASPIRIN 81 MG PO CHEW
81.0000 mg | CHEWABLE_TABLET | Freq: Every day | ORAL | Status: DC
  Administered 2016-03-17 – 2016-03-18 (×2): 81 mg via ORAL
  Filled 2016-03-16 (×2): qty 1

## 2016-03-16 MED ORDER — FENTANYL CITRATE (PF) 100 MCG/2ML IJ SOLN
50.0000 ug | Freq: Once | INTRAMUSCULAR | Status: AC
  Administered 2016-03-16: 50 ug via INTRAVENOUS
  Filled 2016-03-16: qty 2

## 2016-03-16 MED ORDER — NITROGLYCERIN 0.4 MG SL SUBL
0.4000 mg | SUBLINGUAL_TABLET | SUBLINGUAL | Status: DC | PRN
  Administered 2016-03-16: 0.4 mg via SUBLINGUAL
  Filled 2016-03-16: qty 1

## 2016-03-16 MED ORDER — FENTANYL CITRATE (PF) 100 MCG/2ML IJ SOLN
INTRAMUSCULAR | Status: DC | PRN
  Administered 2016-03-16: 25 ug via INTRAVENOUS

## 2016-03-16 MED ORDER — TICAGRELOR 90 MG PO TABS
ORAL_TABLET | ORAL | Status: DC | PRN
  Administered 2016-03-16: 180 mg via ORAL

## 2016-03-16 MED ORDER — BIVALIRUDIN 250 MG IV SOLR
250.0000 mg | INTRAVENOUS | Status: DC | PRN
  Administered 2016-03-16: 1.75 mg/kg/h via INTRAVENOUS

## 2016-03-16 MED ORDER — LIDOCAINE HCL (PF) 1 % IJ SOLN
INTRAMUSCULAR | Status: AC
  Filled 2016-03-16: qty 30

## 2016-03-16 MED ORDER — HEPARIN (PORCINE) IN NACL 100-0.45 UNIT/ML-% IJ SOLN
1000.0000 [IU]/h | INTRAMUSCULAR | Status: DC
  Filled 2016-03-16: qty 250

## 2016-03-16 MED ORDER — IOPAMIDOL (ISOVUE-370) INJECTION 76%
INTRAVENOUS | Status: DC | PRN
  Administered 2016-03-16: 140 mL via INTRA_ARTERIAL

## 2016-03-16 MED ORDER — SODIUM CHLORIDE 0.9% FLUSH
3.0000 mL | Freq: Two times a day (BID) | INTRAVENOUS | Status: DC
  Administered 2016-03-16 – 2016-03-18 (×4): 3 mL via INTRAVENOUS

## 2016-03-16 MED ORDER — MIDAZOLAM HCL 2 MG/2ML IJ SOLN
INTRAMUSCULAR | Status: AC
  Filled 2016-03-16: qty 2

## 2016-03-16 MED ORDER — FENTANYL CITRATE (PF) 100 MCG/2ML IJ SOLN
INTRAMUSCULAR | Status: AC
  Filled 2016-03-16: qty 2

## 2016-03-16 MED ORDER — HEPARIN (PORCINE) IN NACL 2-0.9 UNIT/ML-% IJ SOLN
INTRAMUSCULAR | Status: DC | PRN
  Administered 2016-03-16: 13:00:00

## 2016-03-16 MED ORDER — ASPIRIN 81 MG PO CHEW
324.0000 mg | CHEWABLE_TABLET | Freq: Once | ORAL | Status: AC
  Administered 2016-03-16: 324 mg via ORAL
  Filled 2016-03-16: qty 4

## 2016-03-16 MED ORDER — BIVALIRUDIN 250 MG IV SOLR
INTRAVENOUS | Status: AC
  Filled 2016-03-16: qty 250

## 2016-03-16 MED ORDER — ENOXAPARIN SODIUM 40 MG/0.4ML ~~LOC~~ SOLN
40.0000 mg | SUBCUTANEOUS | Status: DC
  Administered 2016-03-17: 40 mg via SUBCUTANEOUS
  Filled 2016-03-16: qty 0.4

## 2016-03-16 MED ORDER — IOPAMIDOL (ISOVUE-370) INJECTION 76%
INTRAVENOUS | Status: AC
  Filled 2016-03-16: qty 50

## 2016-03-16 MED ORDER — DIPHENHYDRAMINE HCL 50 MG/ML IJ SOLN
INTRAMUSCULAR | Status: AC
  Administered 2016-03-16: 50 mg
  Filled 2016-03-16: qty 1

## 2016-03-16 MED ORDER — HEPARIN (PORCINE) IN NACL 2-0.9 UNIT/ML-% IJ SOLN
INTRAMUSCULAR | Status: AC
  Filled 2016-03-16: qty 1000

## 2016-03-16 MED ORDER — IOPAMIDOL (ISOVUE-370) INJECTION 76%
INTRAVENOUS | Status: AC
  Filled 2016-03-16: qty 100

## 2016-03-16 MED ORDER — TICAGRELOR 90 MG PO TABS
ORAL_TABLET | ORAL | Status: AC
  Filled 2016-03-16: qty 2

## 2016-03-16 MED ORDER — VERAPAMIL HCL 2.5 MG/ML IV SOLN
INTRAVENOUS | Status: DC | PRN
  Administered 2016-03-16: 10 mL via INTRA_ARTERIAL

## 2016-03-16 MED ORDER — SODIUM CHLORIDE 0.9 % IV SOLN: 250.0000 mL | INTRAVENOUS | Status: DC | PRN

## 2016-03-16 MED ORDER — BIVALIRUDIN BOLUS VIA INFUSION - CUPID
INTRAVENOUS | Status: DC | PRN
  Administered 2016-03-16: 61.2 mg via INTRAVENOUS

## 2016-03-16 SURGICAL SUPPLY — 18 items
BALLN EUPHORA RX 2.5X12 (BALLOONS) ×2
BALLN ~~LOC~~ EMERGE MR 3.25X12 (BALLOONS) ×2
BALLOON EUPHORA RX 2.5X12 (BALLOONS) ×1 IMPLANT
BALLOON ~~LOC~~ EMERGE MR 3.25X12 (BALLOONS) ×1 IMPLANT
CATH INFINITI 5FR ANG PIGTAIL (CATHETERS) ×2 IMPLANT
CATH INFINITI JR4 5F (CATHETERS) ×2 IMPLANT
CATH VISTA GUIDE 6FR XBLAD3.5 (CATHETERS) ×2 IMPLANT
DEVICE RAD COMP TR BAND LRG (VASCULAR PRODUCTS) ×2 IMPLANT
GLIDESHEATH SLEND SS 6F .021 (SHEATH) ×2 IMPLANT
KIT ENCORE 26 ADVANTAGE (KITS) ×2 IMPLANT
KIT HEART LEFT (KITS) ×2 IMPLANT
PACK CARDIAC CATHETERIZATION (CUSTOM PROCEDURE TRAY) ×2 IMPLANT
STENT PROMUS PREM MR 3.0X16 (Permanent Stent) ×2 IMPLANT
SYR MEDRAD MARK V 150ML (SYRINGE) ×2 IMPLANT
TRANSDUCER W/STOPCOCK (MISCELLANEOUS) ×2 IMPLANT
TUBING CIL FLEX 10 FLL-RA (TUBING) ×2 IMPLANT
WIRE ASAHI PROWATER 180CM (WIRE) ×2 IMPLANT
WIRE J 3MM .035X260CM (WIRE) ×2 IMPLANT

## 2016-03-16 NOTE — ED Provider Notes (Signed)
CSN: [id number]     Arrival date & time 03/16/16  [id number] History   First MD Initiated Contact with Patient 03/16/16 1012     Chief Complaint  Patient presents with  . Chest Pain  . Shortness of Breath     (Consider location/radiation/quality/duration/timing/severity/associated sxs/prior Treatment) Patient is a 50 y.o. male presenting with chest pain and shortness of breath.  Chest Pain Pain location:  Substernal area and L chest Pain quality comment:  Pulling Pain radiates to:  Neck and mid back (bilateral arms felt tingling) Pain radiates to the back: yes   Pain severity:  Severe Onset quality:  Sudden Duration:  11 hours Timing:  Constant Progression:  Unchanged Chronicity:  New Relieved by: thinks bp medications helped. Worsened by:  Nothing tried Ineffective treatments:  None tried Associated symptoms: diaphoresis, dizziness (lightheaded), nausea, shortness of breath and vomiting   Associated symptoms: no abdominal pain, no back pain, no cough, no fever, no headache, no lower extremity edema, no palpitations and no syncope   Risk factors: hypertension and smoking   Risk factors: no diabetes mellitus  CAD: unclear, reports being diagnosed with "heart inflammation 21yrs ago" was seen here, no cathtereization at the time.   Shortness of Breath Associated symptoms: chest pain, diaphoresis and vomiting   Associated symptoms: no abdominal pain, no cough, no fever, no headaches, no rash, no sore throat and no syncope     Past Medical History  Diagnosis Date  . Hypertension   . Inguinal hernia   . Heart murmur   . Asthma    Past Surgical History  Procedure Laterality Date  . Hernia repair  [id number]    umbilical hernia repair  . Cardiac catheterization N/A 03/16/2016    Procedure: Left Heart Cath and Coronary Angiography;  Surgeon: Peter M Martinique, MD;  Location: [place] [facility];  Service: Cardiovascular;  Laterality: N/A;  . Cardiac catheterization N/A 03/16/2016    Procedure:  Coronary Stent Intervention;  Surgeon: Peter M Martinique, MD;  Location: [place] [facility];  Service: Cardiovascular;  Laterality: N/A;  OM2   Family History  Problem Relation Age of Onset  . Hypertension Mother   . Sarcoidosis Mother   . Hypertension Father   . Hypertension Sister   . Hypertension Sister    Social History  Substance Use Topics  . Smoking status: Current Some Day Smoker    Types: Cigarettes  . Smokeless tobacco: Never Used  . Alcohol Use: 0.0 oz/week    0 Standard drinks or equivalent per week     Comment: 2 beers every few days    Review of Systems  Constitutional: Positive for diaphoresis. Negative for fever.  HENT: Negative for sore throat.   Eyes: Negative for visual disturbance.  Respiratory: Positive for shortness of breath. Negative for cough.   Cardiovascular: Positive for chest pain. Negative for palpitations and syncope.  Gastrointestinal: Positive for nausea and vomiting. Negative for abdominal pain.  Genitourinary: Negative for difficulty urinating.  Musculoskeletal: Negative for back pain and neck stiffness.  Skin: Negative for rash.  Neurological: Positive for dizziness (lightheaded). Negative for syncope and headaches.      Allergies  Peanut-containing drug products and Shellfish allergy  Home Medications   Prior to Admission medications   Medication Sig Start Date End Date Taking? Authorizing Provider  amLODipine (NORVASC) 10 MG tablet Take 1 tablet (10 mg total) by mouth daily. 03/09/16   Mack Hook, MD  cyclobenzaprine (FLEXERIL) 5 MG tablet Take 1 tablet (5  mg total) by mouth 3 (three) times daily as needed for muscle spasms. Patient not taking: Reported on 12/05/2015 09/22/15   Serita Grit, MD  ibuprofen (ADVIL,MOTRIN) 200 MG tablet Take 600 mg by mouth every 6 (six) hours as needed for mild pain. Reported on 03/09/2016    Historical Provider, MD  lisinopril-hydrochlorothiazide (PRINZIDE,ZESTORETIC) 20-25 MG tablet Take 1 tablet  by mouth daily. 03/09/16   Mack Hook, MD  meloxicam (MOBIC) 7.5 MG tablet Take 1 tablet (7.5 mg total) by mouth daily. Patient not taking: Reported on 03/09/2016 12/05/15   Clayton Bibles, PA-C  traMADol (ULTRAM) 50 MG tablet Take 50 mg by mouth at bedtime as needed.    Historical Provider, MD   BP 149/82 mmHg  Pulse 84  Temp(Src) 97.8 F (36.6 C) (Oral)  Resp 19  Ht 5\' 8"  (1.727 m)  Wt 173 lb 1 oz (78.5 kg)  BMI 26.32 kg/m2  SpO2 98% Physical Exam  Constitutional: He is oriented to person, place, and time. He appears well-developed and well-nourished. He appears distressed (appears in pain).  HENT:  Head: Normocephalic and atraumatic.  Eyes: Conjunctivae and EOM are normal.  Neck: Normal range of motion.  Cardiovascular: Normal rate, regular rhythm, normal heart sounds and intact distal pulses.  Exam reveals no gallop and no friction rub.   No murmur heard. Pulmonary/Chest: Effort normal and breath sounds normal. No respiratory distress. He has no wheezes. He has no rales.  Abdominal: Soft. He exhibits no distension. There is no tenderness. There is no guarding.  Musculoskeletal: He exhibits no edema.  Neurological: He is alert and oriented to person, place, and time.  Skin: Skin is warm and dry. He is not diaphoretic.  Nursing note and vitals reviewed.   ED Course  Procedures (including critical care time) Labs Review Labs Reviewed  BASIC METABOLIC PANEL - Abnormal; Notable for the following:    Potassium 3.4 (*)    Chloride 98 (*)    Glucose, Bld 125 (*)    Calcium 10.7 (*)    Anion gap 16 (*)    All other components within normal limits  CBC - Abnormal; Notable for the following:    WBC 15.5 (*)    All other components within normal limits  HEPARIN LEVEL (UNFRACTIONATED) - Abnormal; Notable for the following:    Heparin Unfractionated <0.10 (*)    All other components within normal limits  CBC - Abnormal; Notable for the following:    WBC 13.5 (*)    Hemoglobin  12.9 (*)    HCT 38.1 (*)    All other components within normal limits  TROPONIN I - Abnormal; Notable for the following:    Troponin I >65.00 (*)    All other components within normal limits  I-STAT TROPOININ, ED - Abnormal; Notable for the following:    Troponin i, poc 14.79 (*)    All other components within normal limits  MRSA PCR SCREENING  CREATININE, SERUM  HEMOGLOBIN A1C  TROPONIN I  TROPONIN I  BASIC METABOLIC PANEL  CBC  LIPID PANEL  POCT ACTIVATED CLOTTING TIME    Imaging Review Dg Chest Portable 1 View  03/16/2016  CLINICAL DATA:  Chest pain starting last night EXAM: PORTABLE CHEST 1 VIEW COMPARISON:  08/05/2004 FINDINGS: Cardiomediastinal silhouette is stable. Study is limited by poor inspiration. No infiltrate or pleural effusion. No pulmonary edema. Mild basilar atelectasis. IMPRESSION: No infiltrate or pulmonary edema.  Mild basilar atelectasis. Electronically Signed   By: Orlean Bradford.D.  On: 03/16/2016 11:33   I have personally reviewed and evaluated these images and lab results as part of my medical decision-making.   EKG Interpretation   Date/Time:  Tuesday March 16 2016 11:29:04 EDT Ventricular Rate:  88 PR Interval:  145 QRS Duration: 103 QT Interval:  411 QTC Calculation: 497 R Axis:   -45 Text Interpretation:  Sinus rhythm Multiform ventricular premature  complexes Probable left atrial enlargement Abnormal R-wave progression,  early transition Probable lateral infarct, recent Abnormal T, consider  ischemia, diffuse leads Similar biphasic T wave changes with increased  elevation in j point in comparison to prior, dynamic T wave changes, and  Code  STEMI initiated Confirmed by [facility] [place] ([id number]) on  03/16/2016 6:39:02 PM       CRITICAL CARE:ACS Performed by: Alvino Chapel   Total critical care time: 30 minutes  Critical care time was exclusive of separately billable procedures and treating other patients.  Critical care  was necessary to treat or prevent imminent or life-threatening deterioration.  Critical care was time spent personally by me on the following activities: development of treatment plan with patient and/or surrogate as well as nursing, discussions with consultants, evaluation of patient's response to treatment, examination of patient, obtaining history from patient or surrogate, ordering and performing treatments and interventions, ordering and review of laboratory studies, ordering and review of radiographic studies, pulse oximetry and re-evaluation of patient's condition.   MDM   Final diagnoses:  Acute coronary syndrome [facility])   50 year old male with a history of hypertension and prior "inflammation of the heart" presents with concern for chest pain, shortness of breath, nausea and vomiting. EKG done in triage showed biphasic T waves, lateral T-wave abnormalities which were new from prior and concern for NSTEMI on initial evaluation of ECG.  Patient placed in room and troponin returned positive at 15.  Pt started on heparin, given ASA/nitro and order placed for Cariology consult. Little change in pain with nitro. Repeat ECG done showing dynamic changes, increased J-point elevation however continued biphasic morphology.  Given continued pain, ECG with concerning findings-slight ST elevation however with biphasic TW morphology, (evolution of prior STEMI vs NSTEMI), called Code STEMI. Cardiology brought patient to the Cath lab for further care.    Gareth Morgan, MD 03/16/16 [phone]

## 2016-03-16 NOTE — Progress Notes (Signed)
ANTICOAGULATION CONSULT NOTE - Initial Consult  Pharmacy Consult for heparin Indication: chest pain/ACS  Allergies  Allergen Reactions  . Peanut-Containing Drug Products Anaphylaxis and Swelling  . Shellfish Allergy Anaphylaxis and Swelling    Patient Measurements: Height: 5\' 8"  (172.7 cm) Weight: 180 lb (81.647 kg) IBW/kg (Calculated) : 68.4 Heparin Dosing Weight: 81.6 kg  Vital Signs: Temp: 98.4 F (36.9 C) (03/28 1000) Temp Source: Oral (03/28 1000) BP: 142/92 mmHg (03/28 1111) Pulse Rate: 91 (03/28 1100)  Labs:  Recent Labs  03/16/16 1005  HGB 13.6  HCT 40.7  PLT 357  CREATININE 1.02    Estimated Creatinine Clearance: 85.7 mL/min (by C-G formula based on Cr of 1.02).   Medical History: Past Medical History  Diagnosis Date  . Hypertension   . Inguinal hernia     Assessment: 50 yo M presents on 3/28 with chest pain. Pharmacy consulted to dose heparin.  Hgb and plts wnl. No anticoag PTA.  Goal of Therapy:  Heparin level 0.3-0.7 units/ml Monitor platelets by anticoagulation protocol: Yes   Plan:  Give heparin 4,000 units BOLUS Start heparin gtt at 1,000 units/hr Monitor daily HL, CBC, s/s of bleed  Elenor Quinones, PharmD, BCPS Clinical Pharmacist Pager [phone] 03/16/2016 11:18 AM

## 2016-03-16 NOTE — ED Notes (Addendum)
Pt here for chest pain and SOB that started last night with tingling in both arm. sts recently started on new BP meds. Pt in acute distress and diaphoretic. Sts hurts to breathe and take a breath.

## 2016-03-16 NOTE — Progress Notes (Signed)
Spoke to patient regarding primary care resources and the [facility] orange card. Patient is a current orange card holder and established with care at the [facility] clinic. Patient states he has an upcoming appointment with his pcp. Patients [facility] case manager will be notified of today's visit to follow once discharged. My contact information provided for any future questions or concerns. No other [facility] Specialist needs identified at this time.   [facility] [phone]

## 2016-03-16 NOTE — Progress Notes (Signed)
CRITICAL VALUE ALERT  Critical value received:  Troponin >65 Date of notification:  03/16/2016  Time of notification:  [id number]  Critical value read back:Yes.    Nurse who received alert:  Carleene Cooper RN  MD notified (1st page):  Bhagat PA  Time of first page:  [phone]  MD notified (2nd page):  Time of second page:  Responding MD:  Curly [place]  PA  Time MD responded:  [phone]

## 2016-03-16 NOTE — H&P (Signed)
Patient ID: Kenneth Long MRN: [id number], DOB/AGE: [birth year]   Admit date: 03/16/2016   Primary Physician: No PCP Per Patient Primary Cardiologist: [facility]  Pt. Profile:  Kenneth Long is a 50 y.o. male with a history of HTN, R hip pain and ongoing tobacco absue who presented to [facility] ED for evaluation of chest pain.   HPI: As above. The patient was seen by cardiologist at [facility] Approximately a few months ago. The patient was unable to provide any reason. Had Stress test and Echo and told that he has some "inflammation of heart". Unable to provide any further info. Patient has a chronic right hip pain  which followed by Dr. Paulla Fore at [place].The patient was originally schedule one week ago however reschedule due to elevated blood pressure. He is now scheduled to have it done next week. Patient started to having left-sided substernal chest pressure last night around 11pm. Getting worse. Associated with SOB, nausea, diaphoresis, few episode of vomiting. Admits to having dizziness. The patient denies , palpitations, s orthopnea, PND, dizziness, syncope, cough, congestion, abdominal pain, hematochezia, melena, lower extremity edema.  In ED POC troponin of 14.79, K of 3.4. CXr without acute abnormality. EKG showed sinus rhythm, PVCs, LVH, diffuse TWI with inferior lead ST elevation minimally. Taken to cath lab. WBC of 15.5. He smokes tobacco and drinks alcohol. No use of illicit drug use. Significant family hx of cardiac disease.   Problem List  Past Medical History  Diagnosis Date  . Hypertension   . Inguinal hernia     Past Surgical History  Procedure Laterality Date  . Hernia repair  [id number]    umbilical hernia repair     Allergies  Allergies  Allergen Reactions  . Peanut-Containing Drug Products Anaphylaxis and Swelling  . Shellfish Allergy Anaphylaxis and Swelling     Home Medications  Prior to Admission medications    Medication Sig Start Date End Date Taking? Authorizing Provider  amLODipine (NORVASC) 10 MG tablet Take 1 tablet (10 mg total) by mouth daily. 03/09/16   Mack Hook, MD  cyclobenzaprine (FLEXERIL) 5 MG tablet Take 1 tablet (5 mg total) by mouth 3 (three) times daily as needed for muscle spasms. Patient not taking: Reported on 12/05/2015 09/22/15   Serita Grit, MD  ibuprofen (ADVIL,MOTRIN) 200 MG tablet Take 600 mg by mouth every 6 (six) hours as needed for mild pain. Reported on 03/09/2016    Historical Provider, MD  lisinopril-hydrochlorothiazide (PRINZIDE,ZESTORETIC) 20-25 MG tablet Take 1 tablet by mouth daily. 03/09/16   Mack Hook, MD  meloxicam (MOBIC) 7.5 MG tablet Take 1 tablet (7.5 mg total) by mouth daily. Patient not taking: Reported on 03/09/2016 12/05/15   Clayton Bibles, PA-C  traMADol (ULTRAM) 50 MG tablet Take 50 mg by mouth at bedtime as needed.    Historical Provider, MD    Family History  Family History  Problem Relation Age of Onset  . Hypertension Mother   . Sarcoidosis Mother   . Hypertension Father   . Hypertension Sister   . Hypertension Sister    Family Status  Relation Status Death Age  . Mother Alive     49 yo  . Father Deceased 68 yo    complication of surgery for unknown intestinal disease  . Sister Alive     73 yo  . Daughter Alive     31 yo  . Son Alive     14 yo  . Sister Alive  85 yo    Social History  Social History   Social History  . Marital Status: Single    Spouse Name: N/A  . Number of Children: 2  . Years of Education: N/A   Occupational History  . 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 Smoking status: Current Some Day Smoker    Types: Cigarettes  . Smokeless tobacco: Never Used  . Alcohol Use: 0.0 oz/week    0 Standard drinks or equivalent per week     Comment: 2 beers every few days  . Drug Use: No  . Sexual Activity: Not on file   Other Topics  Concern  . Not on file   Social History Narrative   Lives by himself   Unemployed due to hip problems   Born and raised in [place] since early 1980s.        All other systems reviewed and are otherwise negative except as noted above.  Physical Exam  Blood pressure 136/98, pulse 82, temperature 98.4 F (36.9 C), temperature source Oral, resp. rate 12, height 5\' 8"  (1.727 m), weight 180 lb (81.647 kg), SpO2 98 %.  General: Pleasant, NAD Psych: Normal affect. Neuro: Alert and oriented X 3. Moves all extremities spontaneously. HEENT: Normal  Neck: Supple without bruits or JVD. Lungs:  Resp regular and unlabored, CTA. Heart: RRR no s3, s4, or murmurs. Abdomen: Soft, non-tender, non-distended, BS + x 4.  Extremities: No clubbing, cyanosis or edema. DP/PT/Radials 2+ and equal bilaterally.  Labs  No results for input(s): CKTOTAL, CKMB, TROPONINI in the last 72 hours. Lab Results  Component Value Date   WBC 15.5* 03/16/2016   HGB 13.6 03/16/2016   HCT 40.7 03/16/2016   MCV 88.7 03/16/2016   PLT 357 03/16/2016    Recent Labs Lab 03/16/16 1005  NA 137  K 3.4*  CL 98*  CO2 23  BUN 8  CREATININE 1.02  CALCIUM 10.7*  GLUCOSE 125*   No results found for: CHOL, HDL, LDLCALC, TRIG No results found for: DDIMER   Radiology/Studies  Mr Hip Right Wo Contrast  03/12/2016  CLINICAL DATA:  Right hip pain for 1 year EXAM: MR OF THE RIGHT HIP WITHOUT CONTRAST TECHNIQUE: Multiplanar, multisequence MR imaging was performed. No intravenous contrast was administered. COMPARISON:  None. FINDINGS: Bones: No marrow signal abnormality. No fracture or dislocation. No avascular necrosis. No aggressive osseous lesion. Articular cartilage and labrum Articular cartilage: Mild chondromalacia of the right hip. Small marginal osteophytes. Labrum:  Right labral degeneration with a superior labral tear. Joint or bursal effusion Joint effusion:  No joint effusion.  Bursae:  No bursa formation. Muscles and tendons Flexors: Normal. Extensors: Normal. Abductors: Normal. Adductors: Normal. Rotators: Normal. Hamstrings: Normal. Other findings Miscellaneous: No fluid collection or hematoma. No inguinal hernia. No inguinal lymphadenopathy. No pelvic free fluid. IMPRESSION: 1. Mild osteoarthritis of the right hip. 2. 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 3. No hip fracture, dislocation or avascular necrosis. Electronically Signed   By: Kathreen Devoid   On: 03/12/2016 15:53   Dg Chest Portable 1 View  03/16/2016  CLINICAL DATA:  Chest pain starting last night EXAM: PORTABLE CHEST 1 VIEW COMPARISON:  08/05/2004 FINDINGS: Cardiomediastinal silhouette is stable. Study is limited by poor inspiration. No infiltrate or pleural effusion. No pulmonary edema. Mild basilar atelectasis. IMPRESSION: No infiltrate or pulmonary edema.  Mild basilar atelectasis. Electronically Signed   By:  Lahoma Crocker M.D.   On: 03/16/2016 11:33    ECG  EKG showed sinus rhythm, PVCs, LVH, diffuse TWI with inferior lead ST elevation minimally.  ASSESSMENT AND PLAN   1. STEMI vs NSTEMI  - POC troponin of 14.79.  EKG showed sinus rhythm, PVCs, LVH, diffuse TWI with inferior lead ST elevation minimally. Pending emergent cath.  - Seen by cardiologist at [facility] few months ago. Will get records.   2. Uncontrolled HTN - elevated on admission. Recently started on amlodipine.   3. R hip pain - Schedule for R hip replacement next week  4. Tobacco and alcohol abuse  5. Leucocytosis   Signed, Bhagat,Bhavinkumar, PA-C 03/16/2016, 12:08 PM Pager [phone] Patient seen and examined and history reviewed. Agree with above findings and plan. 50 yo BM with history of HTN, osteoarthritis of hip, Tobacco abuse, and family history of CAD presents late in the course of a lateral STEMI. Symptoms began > 12 hours prior to presentation. No prior MI. Due to ongoing chest pain urgent  cardiac cath with potential non primary PCI recommended.    Peter Martinique, [place]  03/16/2016 1:12 PM

## 2016-03-16 NOTE — Care Management Note (Signed)
Case Management Note  Patient Details  Name: Kenneth Long MRN: [id number] Date of Birth: Aug 24, 1966  Subjective/Objective:                 Adm w mi  Action/Plan: lives at home   Expected Discharge Date:                  Expected Discharge Plan:  Home/Self Care  In-House Referral:     Discharge planning Services     Post Acute Care Choice:    Choice offered to:     DME Arranged:    DME Agency:     HH Arranged:    [place] Agency:     Status of Service:     Medicare Important Message Given:    Date Medicare IM Given:    Medicare IM give by:    Date Additional Medicare IM Given:    Additional Medicare Important Message give by:     If discussed at [place] of Stay Meetings, dates discussed:    Additional Comments: ur review done. Gave pt 30day free and copay card for brilinta. Pt states he has pcp.  Lacretia Leigh, RN 03/16/2016, 1:53 PM

## 2016-03-16 NOTE — Progress Notes (Signed)
CRITICAL VALUE ALERT  Critical value received: Troponin >65  Date of notification:  03/16/2016  Time of notification:  2300  Critical value read back:Yes.    Nurse who received alert:  Arnetha Courser, RN  MD notified (1st Senia Even):  [place]   Time of first Macaila Tahir:  2305  MD notified (2nd Leanore Biggers):  Time of second Elnor Renovato:  Responding MD:  Percival Spanish  Time MD responded:  2306

## 2016-03-16 NOTE — ED Notes (Signed)
Pt refuses second nitro at this time.

## 2016-03-17 ENCOUNTER — Inpatient Hospital Stay (HOSPITAL_COMMUNITY): Payer: Self-pay

## 2016-03-17 DIAGNOSIS — E876 Hypokalemia: Secondary | ICD-10-CM

## 2016-03-17 DIAGNOSIS — Z720 Tobacco use: Secondary | ICD-10-CM

## 2016-03-17 DIAGNOSIS — I213 ST elevation (STEMI) myocardial infarction of unspecified site: Secondary | ICD-10-CM

## 2016-03-17 DIAGNOSIS — I25110 Atherosclerotic heart disease of native coronary artery with unstable angina pectoris: Secondary | ICD-10-CM

## 2016-03-17 LAB — CBC
HEMATOCRIT: 39 % (ref 39.0–52.0)
HEMOGLOBIN: 12.9 g/dL — AB (ref 13.0–17.0)
MCH: 29.2 pg (ref 26.0–34.0)
MCHC: 33.1 g/dL (ref 30.0–36.0)
MCV: 88.2 fL (ref 78.0–100.0)
Platelets: 365 10*3/uL (ref 150–400)
RBC: 4.42 MIL/uL (ref 4.22–5.81)
RDW: 13.3 % (ref 11.5–15.5)
WBC: 18.3 10*3/uL — ABNORMAL HIGH (ref 4.0–10.5)

## 2016-03-17 LAB — LIPID PANEL
Cholesterol: 228 mg/dL — ABNORMAL HIGH (ref 0–200)
HDL: 35 mg/dL — ABNORMAL LOW (ref >40)
LDL CALC: 117 mg/dL — AB (ref 0–99)
Total CHOL/HDL Ratio: 6.5 RATIO
Triglycerides: 380 mg/dL — ABNORMAL HIGH (ref <150)
VLDL: 76 mg/dL — AB (ref 0–40)

## 2016-03-17 LAB — BASIC METABOLIC PANEL
ANION GAP: 11 (ref 5–15)
BUN: 14 mg/dL (ref 6–20)
CALCIUM: 10.3 mg/dL (ref 8.9–10.3)
CO2: 23 mmol/L (ref 22–32)
Chloride: 104 mmol/L (ref 101–111)
Creatinine, Ser: 1.12 mg/dL (ref 0.61–1.24)
GFR calc Af Amer: >60 mL/min (ref >60)
GFR calc non Af Amer: >60 mL/min (ref >60)
GLUCOSE: 150 mg/dL — AB (ref 65–99)
POTASSIUM: 3.5 mmol/L (ref 3.5–5.1)
Sodium: 138 mmol/L (ref 135–145)

## 2016-03-17 LAB — TROPONIN I

## 2016-03-17 LAB — ECHOCARDIOGRAM COMPLETE
Height: 68 in
Weight: 2768.98 oz

## 2016-03-17 LAB — HEMOGLOBIN A1C
HEMOGLOBIN A1C: 5.4 % (ref 4.8–5.6)
MEAN PLASMA GLUCOSE: 108 mg/dL

## 2016-03-17 MED ORDER — DIPHENHYDRAMINE HCL 25 MG PO CAPS: 25.0000 mg | ORAL_CAPSULE | Freq: Four times a day (QID) | ORAL | Status: DC | PRN

## 2016-03-17 MED ORDER — TRAMADOL HCL 50 MG PO TABS: 50.0000 mg | ORAL_TABLET | Freq: Four times a day (QID) | ORAL | Status: DC | PRN

## 2016-03-17 MED ORDER — METOPROLOL TARTRATE 25 MG PO TABS
25.0000 mg | ORAL_TABLET | Freq: Two times a day (BID) | ORAL | Status: DC
  Administered 2016-03-17 (×2): 25 mg via ORAL
  Filled 2016-03-17 (×2): qty 1

## 2016-03-17 MED ORDER — POTASSIUM CHLORIDE CRYS ER 20 MEQ PO TBCR
60.0000 meq | EXTENDED_RELEASE_TABLET | Freq: Once | ORAL | Status: AC
Start: 2016-03-17 — End: 2016-03-17
  Administered 2016-03-17: 60 meq via ORAL
  Filled 2016-03-17: qty 3

## 2016-03-17 NOTE — Progress Notes (Addendum)
     SUBJECTIVE: No chest pain this am. No SOB.   BP 116/77 mmHg  Pulse 73  Temp(Src) 98.1 F (36.7 C) (Oral)  Resp 18  Ht 5\' 8"  (1.727 m)  Wt 173 lb 1 oz (78.5 kg)  BMI 26.32 kg/m2  SpO2 98%  Intake/Output Summary (Last 24 hours) at 03/17/16 [id number] Last data filed at 03/17/16 0600  Gross per 24 hour  Intake 1699.2 ml  Output   1300 ml  Net  399.2 ml    PHYSICAL EXAM General: Well developed, well nourished, in no acute distress. Alert and oriented x 3.  Psych:  Good affect, responds appropriately Neck: No JVD. No masses noted.  Lungs: Clear bilaterally with no wheezes or rhonci noted.  Heart: RRR with no murmurs noted. Abdomen: Bowel sounds are present. Soft, non-tender.  Extremities: No lower extremity edema.   LABS: Basic Metabolic Panel:  Recent Labs  03/16/16 1005 03/16/16 1405 03/17/16 0407  NA 137  --  138  K 3.4*  --  3.5  CL 98*  --  104  CO2 23  --  23  GLUCOSE 125*  --  150*  BUN 8  --  14  CREATININE 1.02 1.10 1.12  CALCIUM 10.7*  --  10.3   CBC:  Recent Labs  03/16/16 1405 03/17/16 0407  WBC 13.5* 18.3*  HGB 12.9* 12.9*  HCT 38.1* 39.0  MCV 88.0 88.2  PLT 346 365   Cardiac Enzymes:  Recent Labs  03/16/16 1605 03/16/16 2149 03/17/16 0407  TROPONINI >65.00* >65.00* >65.00*   Fasting Lipid Panel:  Recent Labs  03/17/16 0407  CHOL 228*  HDL 35*  LDLCALC 117*  TRIG 380*  CHOLHDL 6.5    Current Meds: . aspirin  81 mg Oral Daily  . atorvastatin  80 mg Oral q1800  . enoxaparin (LOVENOX) injection  40 mg Subcutaneous Q24H  . sodium chloride flush  3 mL Intravenous Q12H  . ticagrelor  90 mg Oral BID     ASSESSMENT AND PLAN:  1. CAD/Lateral STEMI: Pt admitted 03/16/16 with lateral STEMI. OM 2 occluded. DES placed OM2. Minimal disease in the LAD and RCA. He is on ASA, Brilinta and statin. Will start beta blocker today, low dose. Echo today. Transfer to telemetry unit.   2. HTN: BP up this am . Add beta blocker.   3. Tobacco  abuse: cessation recommended  4. Hypokalemia: replace this am  Transfer to tele  [facility]  3/29/20178:08 AM

## 2016-03-17 NOTE — Progress Notes (Signed)
  Echocardiogram 2D Echocardiogram has been performed.  Kenneth Long 03/17/2016, 1:03 PM

## 2016-03-17 NOTE — Progress Notes (Signed)
CSW engaged with Patient at his bedside. Patient reports that he is a current orange card holder and has established care with the [facility] and is under the care of Dr. Amil Amen. Patient reports that he receives his medications at either [facility] or [facility]. Patient reports that he has applied for both disability and medicaid. CSW provided Patient with resources for hot meals and food pantries in [place]. Patient reports that he does not need food resources at this time. Patient inquired about financial assistance for his medical bills. CSW informed Patient of financial counselors who could assist him with his financial concerns upon receiving his medical bill. Patient inquired about receiving assistance with cleaning his home and obtaining a handicap sticker. CSW informed Patient that he would have to go through his PCP and the DMV in order to obtain a handicap sticker. CSW also explained to Patient that cleaning services would have to be paid out of pocket. CSW informed Patient that he has been set up with a case manager through [facility] who will be able to assist him with further resources upon discharge from the hospital. Patient agreeable to plans at this time.  Holly Bodily, MSW, LCSW [facility] Clinical Social Worker [phone]

## 2016-03-17 NOTE — Progress Notes (Signed)
CARDIAC REHAB PHASE I   PRE:  Rate/Rhythm: 78 SR  BP:  Sitting: 115/77        SaO2: 99 RA  MODE:  Ambulation: 700 ft   POST:  Rate/Rhythm: 100 SR  BP:  Sitting: 134/87         SaO2: 98 RA  Pt ambulated 700 ft on RA, independent, steady gait, tolerated well.  Pt c/o mild DOE, denies cp, dizziness, declined rest stop. Completed MI/stent education with pt and sister at bedside.  Reviewed risk factors, tobacco cessaiton (gave pt fake cigarette), MI book, anti-platelet therapy, stent card, activity restrictions, ntg, exercise, heart healthy diet, sodium restrictions, portion control, and phase 2 cardiac rehab. Pt verbalized understanding, receptive to education but states he is overwhelmed. Pt agrees to phase 2 cardiac rehab referral, will send to [facility] per pt request. Pt states he is uncertain as to where he will be getting his brilinta, CM following, will need to update pt. Pt to bed per pt request after walk, call bell within reach. Will follow.  [place], RN, BSN 03/17/2016 3:25 PM

## 2016-03-17 NOTE — Progress Notes (Signed)
Per sw [facility]. Has orange card. Gets meds from [facility] or [facility]. Will place brilinta pt assist form on shadow card.

## 2016-03-17 NOTE — Progress Notes (Signed)
Notified by pt regarding tender area on LLQ Lovenox injection site.  Measure 2cm X 2cm and covered hematoma with guaze. Will continue to monitor.

## 2016-03-18 ENCOUNTER — Telehealth: Payer: Self-pay | Admitting: Internal Medicine

## 2016-03-18 LAB — BASIC METABOLIC PANEL
ANION GAP: 12 (ref 5–15)
BUN: 20 mg/dL (ref 6–20)
CHLORIDE: 105 mmol/L (ref 101–111)
CO2: 24 mmol/L (ref 22–32)
Calcium: 9.6 mg/dL (ref 8.9–10.3)
Creatinine, Ser: 1.07 mg/dL (ref 0.61–1.24)
GFR calc non Af Amer: >60 mL/min (ref >60)
Glucose, Bld: 99 mg/dL (ref 65–99)
POTASSIUM: 3.3 mmol/L — AB (ref 3.5–5.1)
SODIUM: 141 mmol/L (ref 135–145)

## 2016-03-18 LAB — CBC
HCT: 38.9 % — ABNORMAL LOW (ref 39.0–52.0)
HEMOGLOBIN: 12.5 g/dL — AB (ref 13.0–17.0)
MCH: 28.9 pg (ref 26.0–34.0)
MCHC: 32.1 g/dL (ref 30.0–36.0)
MCV: 90 fL (ref 78.0–100.0)
Platelets: 377 10*3/uL (ref 150–400)
RBC: 4.32 MIL/uL (ref 4.22–5.81)
RDW: 13.6 % (ref 11.5–15.5)
WBC: 16.8 10*3/uL — ABNORMAL HIGH (ref 4.0–10.5)

## 2016-03-18 MED ORDER — ATORVASTATIN CALCIUM 80 MG PO TABS: 80.0000 mg | ORAL_TABLET | Freq: Every day | ORAL | Status: DC

## 2016-03-18 MED ORDER — TICAGRELOR 90 MG PO TABS: 90.0000 mg | ORAL_TABLET | Freq: Two times a day (BID) | ORAL | Status: DC

## 2016-03-18 MED ORDER — POTASSIUM CHLORIDE CRYS ER 20 MEQ PO TBCR: 20.0000 meq | EXTENDED_RELEASE_TABLET | Freq: Every day | ORAL | Status: DC

## 2016-03-18 MED ORDER — NITROGLYCERIN 0.4 MG SL SUBL: 0.4000 mg | SUBLINGUAL_TABLET | SUBLINGUAL | Status: DC | PRN

## 2016-03-18 MED ORDER — METOPROLOL TARTRATE 50 MG PO TABS
50.0000 mg | ORAL_TABLET | Freq: Two times a day (BID) | ORAL | Status: DC
  Administered 2016-03-18: 50 mg via ORAL
  Filled 2016-03-18: qty 1

## 2016-03-18 MED ORDER — AMLODIPINE BESYLATE 10 MG PO TABS: 10.0000 mg | ORAL_TABLET | Freq: Every day | ORAL | Status: DC

## 2016-03-18 MED ORDER — POTASSIUM CHLORIDE CRYS ER 20 MEQ PO TBCR
60.0000 meq | EXTENDED_RELEASE_TABLET | Freq: Once | ORAL | Status: AC
  Administered 2016-03-18: 60 meq via ORAL
  Filled 2016-03-18: qty 3

## 2016-03-18 MED ORDER — ASPIRIN 81 MG PO CHEW: 81.0000 mg | CHEWABLE_TABLET | Freq: Every day | ORAL | Status: DC

## 2016-03-18 MED ORDER — LISINOPRIL 10 MG PO TABS
20.0000 mg | ORAL_TABLET | Freq: Every day | ORAL | Status: DC
  Administered 2016-03-18: 20 mg via ORAL
  Filled 2016-03-18: qty 2

## 2016-03-18 MED ORDER — AMLODIPINE BESYLATE 10 MG PO TABS
10.0000 mg | ORAL_TABLET | Freq: Every day | ORAL | Status: DC
  Administered 2016-03-18: 10 mg via ORAL
  Filled 2016-03-18: qty 1

## 2016-03-18 MED ORDER — HYDROCHLOROTHIAZIDE 25 MG PO TABS
25.0000 mg | ORAL_TABLET | Freq: Every day | ORAL | Status: DC
  Administered 2016-03-18: 25 mg via ORAL
  Filled 2016-03-18: qty 1

## 2016-03-18 MED ORDER — METOPROLOL TARTRATE 50 MG PO TABS: 50.0000 mg | ORAL_TABLET | Freq: Two times a day (BID) | ORAL | Status: DC

## 2016-03-18 MED FILL — BRILINTA 90 MG TABLET: 90 | 30 days supply | Qty: 60 | Fill #0

## 2016-03-18 MED FILL — NITROSTAT 0.4 MG TABLET SL: 0.4 | 7 days supply | Qty: 25 | Fill #0

## 2016-03-18 NOTE — Progress Notes (Signed)
     SUBJECTIVE: No chest pain or SOB.   Tele: sinus  BP 150/93 mmHg  Pulse 90  Temp(Src) 98.1 F (36.7 C) (Oral)  Resp 20  Ht 5\' 8"  (1.727 m)  Wt 173 lb 1 oz (78.5 kg)  BMI 26.32 kg/m2  SpO2 97%  Intake/Output Summary (Last 24 hours) at 03/18/16 0732 Last data filed at 03/17/16 1800  Gross per 24 hour  Intake    660 ml  Output   1050 ml  Net   -390 ml    PHYSICAL EXAM General: Well developed, well nourished, in no acute distress. Alert and oriented x 3.  Psych:  Good affect, responds appropriately Neck: No JVD. No masses noted.  Lungs: Clear bilaterally with no wheezes or rhonci noted.  Heart: RRR with no murmurs noted. Abdomen: Bowel sounds are present. Soft, non-tender.  Extremities: No lower extremity edema.   LABS: Basic Metabolic Panel:  Recent Labs  03/17/16 0407 03/18/16 0420  NA 138 141  K 3.5 3.3*  CL 104 105  CO2 23 24  GLUCOSE 150* 99  BUN 14 20  CREATININE 1.12 1.07  CALCIUM 10.3 9.6   CBC:  Recent Labs  03/17/16 0407 03/18/16 0420  WBC 18.3* 16.8*  HGB 12.9* 12.5*  HCT 39.0 38.9*  MCV 88.2 90.0  PLT 365 377   Cardiac Enzymes:  Recent Labs  03/16/16 1605 03/16/16 2149 03/17/16 0407  TROPONINI >65.00* >65.00* >65.00*   Fasting Lipid Panel:  Recent Labs  03/17/16 0407  CHOL 228*  HDL 35*  LDLCALC 117*  TRIG 380*  CHOLHDL 6.5    Current Meds: . aspirin  81 mg Oral Daily  . atorvastatin  80 mg Oral q1800  . enoxaparin (LOVENOX) injection  40 mg Subcutaneous Q24H  . lisinopril  20 mg Oral Daily  . metoprolol tartrate  25 mg Oral BID  . sodium chloride flush  3 mL Intravenous Q12H  . ticagrelor  90 mg Oral BID   Echo 03/17/16:  Left ventricle: Posterior and lateral wall hypokinesis The cavity  size was moderately dilated. Systolic function was mildly to  moderately reduced. The estimated ejection fraction was in the  range of 40% to 45%. Wall motion was normal; there were no  regional wall motion  abnormalities. - Mitral valve: There was mild regurgitation. - Left atrium: The atrium was mildly dilated. - Atrial septum: No defect or patent foramen ovale was identified.  ASSESSMENT AND PLAN:  1. CAD/Lateral STEMI: Pt admitted 03/16/16 with lateral STEMI. OM 2 occluded. DES placed OM2. Minimal disease in the LAD and RCA. He is on ASA, Brilinta, beta blocker and statin. Echo with LVEF=40-45%.   2. HTN: BP up this am . Will restart Norvasc at home dose, 10 mg daily. Will increase beta blocker to 50 mg daily. Will continue Ace inh. Will resume HCTZ.   3. Tobacco abuse: cessation recommended  4. Hypokalemia: replace this am   Discharge home today. Follow up with Dr. Martinique.   MCALHANY,CHRISTOPHER  3/30/20177:32 AM

## 2016-03-18 NOTE — Care Management Note (Addendum)
Case Management Note Previous CM note initiated by Donne Anon RN, CM  Patient Details  Name: OVA VOTH MRN: [id number] Date of Birth: May 23, 1966  Subjective/Objective:                 Adm w mi  Action/Plan: lives at home   Expected Discharge Date:    03/18/16              Expected Discharge Plan:  Home/Self Care  In-House Referral:     Discharge planning Services  CM Consult, Medication Assistance, [facility]  Post Acute Care Choice:    Choice offered to:     DME Arranged:    DME Agency:     HH Arranged:    [place] Agency:     Status of Service:  Completed, signed off  Medicare Important Message Given:    Date Medicare IM Given:    Medicare IM give by:    Date Additional Medicare IM Given:    Additional Medicare Important Message give by:     If discussed at [place] of Stay Meetings, dates discussed:    Additional Comments: ur review done. Gave pt 30day free and copay card for brilinta. Pt states he has pcp.  03/18/16- 1000- Molli Gethers RN, BSN- pt for d/c home today- f/u done for d/c needs- pt to go home on Brilinta- has 30 day free card- per conversation with pt he has been established at the [facility]- has his orange card- and [facility] at [facility] at [address]- call made to pharmacy there to be sure they could fill pt's meds- they can only fill statin and lopresser today- pt will have to apply for MAP to be able to fill Brilinta and NTG SL there- have asked PA- Luisa Dago to give pt paper scripts for these that pt can take to [facility]- pt can fill the Brilinta there with the 30 day free card- pt assistance application has been filled out by the PA also- and given to pt to f/u with the HD for MAP- this can take up to 4-6 wks have instructed pt that if he is down to a weeks worth of Brilinta and has not heard if he has MAP for Brilinta to call the Cardiology office to further assistance with Brilinta, pt voices  understanding that he is not to run out of this medication. Per pt he has a f/u with the [facility] for June 20 at 10:30 pt reports that this clinic is convenient to him and would like to continue going there.  Pt also has been seen by the [place]- and plans to f/u on applying for medicaid and disability with DSS.   Dawayne Patricia, RN 03/18/2016, 10:55 AM

## 2016-03-18 NOTE — Discharge Summary (Signed)
Discharge Summary    Patient ID: Kenneth Long,  MRN: [id number], DOB/AGE: [birth year] 48 y.o.  Admit date: 03/16/2016 Discharge date: 03/18/2016  Primary Care Provider: No PCP Per Patient Primary Cardiologist:   [facility] [facility]/ [place]  Discharge Diagnoses    Principal Problem:   ST elevation (STEMI) myocardial infarction involving left circumflex coronary artery [facility] Active Problems:   Essential hypertension   Hypokalemia   Tobacco abuse    Coronary artery disease  Allergies Allergies  Allergen Reactions  . Peanut-Containing Drug Products Anaphylaxis and Swelling  . Shellfish Allergy Anaphylaxis and Swelling    Diagnostic Studies/Procedures    Conclusion     Prox RCA to Mid RCA lesion, 20% stenosed.  1st Mrg lesion, 20% stenosed.  The left ventricular systolic function is normal.  2nd Mrg lesion, 100% stenosed. Post intervention, there is a 0% residual stenosis.  1. Single vessel occlusive CAD 2. Low normal LV function 3. Successful stenting of the second OM with a DES  Plan: DAPT for one year. Patient may be a candidate for fast track discharge if no complications.     Left heart catheterization.  Post-Intervention Diagram          Echo 03/17/16:  Left ventricle: Posterior and lateral wall hypokinesis The cavity  size was moderately dilated. Systolic function was mildly to  moderately reduced. The estimated ejection fraction was in the  range of 40% to 45%. Wall motion was normal; there were no  regional wall motion abnormalities.  - Mitral valve: There was mild regurgitation.  - Left atrium: The atrium was mildly dilated.  - Atrial septum: No defect or patent foramen ovale was identified.  _____________   History of Present Illness      Kenneth Long is a 50 y.o. male with a history of HTN, R hip pain and ongoing tobacco absue who presented to [facility] ED for evaluation of chest pain.  The patient was seen by cardiologist at  [facility] approximately a few months ago. The patient was unable to provide any reason. Had Stress test and Echo and told that he has some "inflammation of heart". Unable to provide any further info. Patient has a chronic right hip pain,which is followed by Dr. Paulla Fore at [place].   Patient started to having left-sided substernal chest pressure last night around 11pm. Getting worse. Associated with SOB, nausea, diaphoresis, few episode of vomiting. Admits to having dizziness. The patient denies , palpitations, s orthopnea, PND, dizziness, syncope, cough, congestion, abdominal pain, hematochezia, melena, lower extremity edema.  In ED POC troponin of 14.79, K of 3.4. CXr without acute abnormality. EKG showed sinus rhythm, PVCs, LVH, diffuse TWI with inferior lead ST elevation minimally. Taken to cath lab. WBC of 15.5. He smokes tobacco and drinks alcohol. No use of illicit drug use. Significant family hx of cardiac disease.   Hospital Course     Consultants: Cardiac rehabilitation   Patient was taken to the Cath Lab 03/16/2016 procedure revealed second marginal branch was 100% stenosed. He underwent 6 successful stenting with a drug-eluting stent. He was put on dual antiplatelets for minimum of a year. Aspirin, Brilinta.  Patient was also started on statin and low-dose beta blocker.  The procedure also revealed proximal RCA mid RCA lesion of 20%, first marginal lesion 20% stenosed and normal left ventricular systolic function. 2-D echocardiogram was completed and revealed his ejection fraction was actually in the 40-45% range with no regional wall motion abnormalities.  Mild MR left atrium is mildly dilated. Tobacco cessation was discussed. His potassium was replaced. He did well with cardiac rehabilitation the day after intervention he walked 700 feet independently.  Pressure was still mildly elevated on the 30th and so we restarted his Norvasc 10 mg daily. We also increase beta  blocker to 50 mg daily. Continued ACE inhibitor and resumed his HCTZ.The patient was seen by Dr. Angelena Form who felt she was stable for DC home.   Initial follow-up was arranged in our office.  Needs LFTs and lipid panel in 6 weeks _____________  Discharge Vitals Blood pressure 154/109, pulse 90, temperature 98.1 F (36.7 C), temperature source Oral, resp. rate 20, height 5\' 8"  (1.727 m), weight 173 lb 1 oz (78.5 kg), SpO2 97 %.  Filed Weights   03/16/16 1000 03/16/16 1324  Weight: 180 lb (81.647 kg) 173 lb 1 oz (78.5 kg)    Labs & Radiologic Studies    CBC  Recent Labs  03/17/16 0407 03/18/16 0420  WBC 18.3* 16.8*  HGB 12.9* 12.5*  HCT 39.0 38.9*  MCV 88.2 90.0  PLT 365 [id number]   Basic Metabolic Panel  Recent Labs  03/17/16 0407 03/18/16 0420  NA 138 141  K 3.5 3.3*  CL 104 105  CO2 23 24  GLUCOSE 150* 99  BUN 14 20  CREATININE 1.12 1.07  CALCIUM 10.3 9.6   Liver Function Tests No results for input(s): AST, ALT, ALKPHOS, BILITOT, PROT, ALBUMIN in the last 72 hours. No results for input(s): LIPASE, AMYLASE in the last 72 hours. Cardiac Enzymes  Recent Labs  03/16/16 1605 03/16/16 2149 03/17/16 0407  TROPONINI >65.00* >65.00* >65.00*   Hemoglobin A1C  Recent Labs  03/16/16 1605  HGBA1C 5.4   Fasting Lipid Panel  Recent Labs  03/17/16 0407  CHOL 228*  HDL 35*  LDLCALC 117*  TRIG 380*  CHOLHDL 6.5   Thyroid Function Tests No results for input(s): TSH, T4TOTAL, T3FREE, THYROIDAB in the last 72 hours.  Invalid input(s): FREET3 _____________  Mr Hip Right Wo Contrast  03/12/2016  CLINICAL DATA:  Right hip pain for 1 year EXAM: MR OF THE RIGHT HIP WITHOUT CONTRAST TECHNIQUE: Multiplanar, multisequence MR imaging was performed. No intravenous contrast was administered. COMPARISON:  None. FINDINGS: Bones: No marrow signal abnormality. No fracture or dislocation. No avascular necrosis. No aggressive osseous lesion. Articular cartilage and labrum Articular  cartilage: Mild chondromalacia of the right hip. Small marginal osteophytes. Labrum:  Right labral degeneration with a superior labral tear. Joint or bursal effusion Joint effusion:  No joint effusion. Bursae:  No bursa formation. Muscles and tendons Flexors: Normal. Extensors: Normal. Abductors: Normal. Adductors: Normal. Rotators: Normal. Hamstrings: Normal. Other findings Miscellaneous: No fluid collection or hematoma. No inguinal hernia. No inguinal lymphadenopathy. No pelvic free fluid. IMPRESSION: 1. Mild osteoarthritis of the right hip. 2. 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 3. No hip fracture, dislocation or avascular necrosis. Electronically Signed   By: Kathreen Devoid   On: 03/12/2016 15:53   Dg Chest Portable 1 View  03/16/2016  CLINICAL DATA:  Chest pain starting last night EXAM: PORTABLE CHEST 1 VIEW COMPARISON:  08/05/2004 FINDINGS: Cardiomediastinal silhouette is stable. Study is limited by poor inspiration. No infiltrate or pleural effusion. No pulmonary edema. Mild basilar atelectasis. IMPRESSION: No infiltrate or pulmonary edema.  Mild basilar atelectasis. Electronically Signed   By: Lahoma Crocker M.D.   On: 03/16/2016 11:33   Disposition   Pt is being discharged home today  in good condition.  Follow-up Plans & Appointments    Follow-up Information    Follow up with Rosaria Ferries, PA-C On 03/25/2016.   Specialties:  Cardiology, Radiology   Why:  2:00PM   Contact information:   [address] [place] [address] [phone]      Discharge Instructions    AMB Referral to Cardiac Rehabilitation - Phase II    Complete by:  As directed   Diagnosis:  Myocardial Infarction     Amb Referral to Cardiac Rehabilitation    Complete by:  As directed   Diagnosis:   Myocardial Infarction PCI       Diet - low sodium heart healthy    Complete by:  As directed      Increase activity slowly    Complete by:  As directed            Discharge Medications     Current Discharge Medication List    START taking these medications   Details  aspirin 81 MG chewable tablet Chew 1 tablet (81 mg total) by mouth daily.    atorvastatin (LIPITOR) 80 MG tablet Take 1 tablet (80 mg total) by mouth daily at 6 PM. Qty: 30 tablet, Refills: 11    metoprolol (LOPRESSOR) 50 MG tablet Take 1 tablet (50 mg total) by mouth 2 (two) times daily. Qty: 60 tablet, Refills: 11    nitroGLYCERIN (NITROSTAT) 0.4 MG SL tablet Place 1 tablet (0.4 mg total) under the tongue every 5 (five) minutes as needed for chest pain. Qty: 25 tablet, Refills: 12    ticagrelor (BRILINTA) 90 MG TABS tablet Take 1 tablet (90 mg total) by mouth 2 (two) times daily. Qty: 60 tablet, Refills: 11      CONTINUE these medications which have NOT CHANGED   Details  amLODipine (NORVASC) 10 MG tablet Take 1 tablet (10 mg total) by mouth daily. Qty: 30 tablet, Refills: 11   Associated Diagnoses: Essential hypertension    cyclobenzaprine (FLEXERIL) 5 MG tablet Take 1 tablet (5 mg total) by mouth 3 (three) times daily as needed for muscle spasms. Qty: 12 tablet, Refills: 0    lisinopril-hydrochlorothiazide (PRINZIDE,ZESTORETIC) 20-25 MG tablet Take 1 tablet by mouth daily. Qty: 30 tablet, Refills: 11   Associated Diagnoses: Essential hypertension    traMADol (ULTRAM) 50 MG tablet Take 50 mg by mouth at bedtime as needed for moderate pain.       STOP taking these medications     ibuprofen (ADVIL,MOTRIN) 200 MG tablet          Aspirin prescribed at discharge?  Yes High Intensity Statin Prescribed? (Lipitor 40-80mg  or Crestor 20-40mg ): Yes Beta Blocker Prescribed? Yes For EF <40%, was ACEI/ARB Prescribed? NA ADP Receptor Inhibitor Prescribed? (i.e. Plavix etc.-Includes Medically Managed Patients): Yes For EF <40%, Aldosterone Inhibitor Prescribed? NA Was EF assessed during THIS hospitalization? Yes Was Cardiac Rehab II ordered? (Included Medically managed Patients): Yes   Outstanding  Labs/Studies   NA  Duration of Discharge Encounter   Greater than 30 minutes including physician time.  Signed, Siddh Vandeventer, [place] PAC 03/18/2016, 9:35 AM

## 2016-03-18 NOTE — Telephone Encounter (Signed)
Patient's sister Duard Larsen called to cancel the two week blood pressure check appointment and wants to inform Dr. Amil Amen that patient is in the hospital due to a heart attack two days ago

## 2016-03-18 NOTE — Progress Notes (Signed)
Pt. Discharged to home Pt. D/C'd via wheelchair with sisters Discharge information reviewed and given All personal belongings given to Pt.  Education discussed and reviewed with patient and family  IV was d/c and intact upon removal    Tele d/c

## 2016-03-18 NOTE — Progress Notes (Signed)
CARDIAC REHAB PHASE I   PRE:  Rate/Rhythm: 90 SR  BP:  Sitting: 138/105        SaO2: 100 RA  MODE:  Ambulation: 550 ft   POST:  Rate/Rhythm: 100 SR  BP:  Sitting: 141/100         SaO2: 100 RA  Pt BP still elevated, RN aware. Pt ambulated 550 ft on RA, independent, slow, steady gait, tolerated well. Pt did c/o of some hip pain (he states he needs hip surgery), denies any other complaints. Reinforced yesterday's education, pt still overwhelmed, states he will have to make significant changes to his diet and that "quitting smoking will be the easy part." Pt still is uncertain where he will be getting his prescriptions, case manager notified, will clarify. Pt to bed per pt request after walk, call bell within reach.   [place], RN, BSN 03/18/2016 9:55 AM

## 2016-03-22 ENCOUNTER — Encounter: Payer: Self-pay | Admitting: Internal Medicine

## 2016-03-23 NOTE — Telephone Encounter (Signed)
Received patient assistance request form from [employer].Form completed and faxed to fax # [phone].

## 2016-03-24 ENCOUNTER — Ambulatory Visit: Payer: Self-pay | Admitting: Internal Medicine

## 2016-03-25 ENCOUNTER — Ambulatory Visit (INDEPENDENT_AMBULATORY_CARE_PROVIDER_SITE_OTHER): Payer: Self-pay | Admitting: Physician Assistant

## 2016-03-25 ENCOUNTER — Encounter: Payer: Self-pay | Admitting: Internal Medicine

## 2016-03-25 ENCOUNTER — Encounter: Payer: Self-pay | Admitting: Physician Assistant

## 2016-03-25 ENCOUNTER — Ambulatory Visit (INDEPENDENT_AMBULATORY_CARE_PROVIDER_SITE_OTHER): Payer: Self-pay | Admitting: Internal Medicine

## 2016-03-25 ENCOUNTER — Encounter: Payer: Self-pay | Admitting: *Deleted

## 2016-03-25 VITALS — BP 130/90 | HR 82 | Ht 68.0 in | Wt 179.0 lb

## 2016-03-25 VITALS — BP 110/78 | HR 67 | Ht 68.0 in | Wt 179.0 lb

## 2016-03-25 DIAGNOSIS — I2121 ST elevation (STEMI) myocardial infarction involving left circumflex coronary artery: Secondary | ICD-10-CM

## 2016-03-25 DIAGNOSIS — M1610 Unilateral primary osteoarthritis, unspecified hip: Secondary | ICD-10-CM

## 2016-03-25 DIAGNOSIS — I10 Essential (primary) hypertension: Secondary | ICD-10-CM

## 2016-03-25 DIAGNOSIS — E782 Mixed hyperlipidemia: Secondary | ICD-10-CM | POA: Insufficient documentation

## 2016-03-25 DIAGNOSIS — E785 Hyperlipidemia, unspecified: Secondary | ICD-10-CM

## 2016-03-25 DIAGNOSIS — M1990 Unspecified osteoarthritis, unspecified site: Secondary | ICD-10-CM

## 2016-03-25 DIAGNOSIS — I255 Ischemic cardiomyopathy: Secondary | ICD-10-CM | POA: Insufficient documentation

## 2016-03-25 MED ORDER — LISINOPRIL-HYDROCHLOROTHIAZIDE 20-25 MG PO TABS: 1.0000 | ORAL_TABLET | Freq: Every day | ORAL | Status: DC

## 2016-03-25 NOTE — Patient Instructions (Addendum)
Medication Instructions:  Your physician recommends that you continue on your current medications as directed. Please refer to the Current Medication list given to you today.   Labwork: 3 MONTHS AT TIME OF APPT:  LIPID & LIVER PANEL   Testing/Procedures: None ordered  Follow-Up: Your physician recommends that you schedule a follow-up appointment in: 3 MONTHS WITH DR. Martinique   Any Other Special Instructions Will Be Listed Below (If Applicable).     If you need a refill on your cardiac medications before your next appointment, please call your pharmacy.

## 2016-03-25 NOTE — Progress Notes (Signed)
Subjective:    Patient ID: Kenneth Long, male    DOB: 03/26/1966, 50 y.o.   MRN: [id number]  HPI    Acute MI suffered 03/16/2016 with successful stenting of a 100 % stenosed branch of CFX artery.  Echo day after MI and stenting showed EF of 40- 45%.  Is now on cholesterol lowering agent, DAPT, and ACE I, Beta blockade, Amlodipine, HCTZ.    Pt. States he was running up and down the stairs, intermittently watching TV and eating a double cheeseburger and smoking at time of MI.  Has not smoked since MI.   Seen earlier today at [facility] by Rosaria Ferries, PA-C.  Has follow up with Dr. Martinique in 3 months.   Has applied for Financial assistance from [facility]  Needs a letter to DSS stating he cannot work currently--hip and recent MI.  If he cannot get hip fixed, cannot work--[employer].   Current outpatient prescriptions:  .  amLODipine (NORVASC) 10 MG tablet, Take 1 tablet (10 mg total) by mouth daily., Disp: 30 tablet, Rfl: 11 .  aspirin 81 MG chewable tablet, Chew 1 tablet (81 mg total) by mouth daily., Disp: , Rfl:  .  atorvastatin (LIPITOR) 80 MG tablet, Take 1 tablet (80 mg total) by mouth daily at 6 PM., Disp: 30 tablet, Rfl: 11 .  metoprolol (LOPRESSOR) 50 MG tablet, Take 1 tablet (50 mg total) by mouth 2 (two) times daily., Disp: 60 tablet, Rfl: 11 .  nitroGLYCERIN (NITROSTAT) 0.4 MG SL tablet, Place 1 tablet (0.4 mg total) under the tongue every 5 (five) minutes as needed for chest pain., Disp: 25 tablet, Rfl: 12 .  ticagrelor (BRILINTA) 90 MG TABS tablet, Take 1 tablet (90 mg total) by mouth 2 (two) times daily., Disp: 60 tablet, Rfl: 0 .  traMADol (ULTRAM) 50 MG tablet, Take 50 mg by mouth at bedtime as needed for moderate pain. , Disp: , Rfl:  .  cyclobenzaprine (FLEXERIL) 5 MG tablet, Take 1 tablet (5 mg total) by mouth 3 (three) times daily as needed for muscle spasms. (Patient not taking: Reported on 03/25/2016), Disp: 12 tablet, Rfl: 0 .   lisinopril-hydrochlorothiazide (PRINZIDE,ZESTORETIC) 20-25 MG tablet, Take 1 tablet by mouth daily., Disp: 30 tablet, Rfl: 6   Allergies  Allergen Reactions  . Peanut-Containing Drug Products Anaphylaxis and Swelling  . Shellfish Allergy Anaphylaxis and Swelling     Review of Systems     Objective:   Physical Exam  NAD Lungs:  CTA CV:  RRR without rub, radial pulses normal and equal.  No lower extremity edema        Assessment & Plan:  CAD, with recent STEMI, involving CFX:  Seems to be doing well.  Encouraged pt. To really get started on a heart healthy diet.  He has made changes already.  Also, given application for scholarship to [employer] --with his hip pain, to consider walking laps in 3 feet of pool.  Needs to start very minimally and gradually increase as tolerated.  I will write letter to DSS.  Essential Hypertension:    BP up a bit here, but was fine in Cardiology--follow for now.  Discussed if he does not get Financial Assistance with [facility], he can get labs here --just to have Dr. Martinique let us know what he would like done.  Follow up in 4 weeks to see if he is doing well with lifestyle changes At some point, needs dental care--discussed inflammatory component with gum and tooth disease.

## 2016-03-25 NOTE — Patient Instructions (Signed)
Drink a glass of water before every meal Drink 6-8 glasses of water daily Eat three meals daily Eat a protein and healthy fat with every meal (eggs,fish, chicken, [place] and limit red meats) Eat 5 servings of vegetables daily, mix the colors Eat 2 servings of fruit daily with skin, if skin is edible Use smaller plates Put food/utensils down as you chew and swallow each bite Eat at a table with friends/family at least once daily, no TV Do not eat in front of the TV  Obviously, anything you are allergic to, I would not recommend, even if listed above, like fish and nuts

## 2016-03-25 NOTE — Progress Notes (Signed)
Cardiology Office Note   Date:  03/25/2016   ID:  Kenneth Long, DOB February 19, 1966, MRN [id number]  PCP:  Kenneth Hook, MD  Cardiologist:  Dr Kenneth  Long, Rhonda, PA-C   Chief Complaint  Patient presents with  . Follow-up    some shortness of breath, no cramping or swelling,     History of Present Illness: Kenneth Long is a 50 y.o. male with a history of HTN, asthma, hernia, tob use.  Dc 03/30 after admit for CFX STEMI, rx w/ DES.  Kenneth Long presents for Post hospital follow-up  Since discharge from the hospital, [name] has done fairly well. He has had one or 2 episodes of chest pain and has taken nitroglycerin for them. Otherwise, he is successfully increasing his activity level. He is compliant with his medications, but is concerned because the Brilinta will cost him over $300. He has filled out the assistance forms, but has not heard back.  He has not had any lower extremity edema, dyspnea on exertion, orthopnea or PND. He has not had any palpitations. He is pleased to be on his blood pressure medications and happy that his blood pressures under good control today.  He has quit smoking and states that he will not start back.   Past Medical History  Diagnosis Date  . Hypertension   . Inguinal hernia   . Asthma   . Heart murmur     Past Surgical History  Procedure Laterality Date  . Hernia repair  [id number]    umbilical hernia repair  . Cardiac catheterization N/A 03/16/2016    Procedure: Left Heart Cath and Coronary Angiography;  Surgeon: Kenneth M Martinique, MD;  Location: [facility];  Service: Cardiovascular;  Laterality: N/A;  . Cardiac catheterization N/A 03/16/2016    Procedure: Coronary Stent Intervention;  Surgeon: Kenneth M Martinique, MD;  Location: [place];  Service: Cardiovascular;  Laterality: N/A;  OM2    Current Outpatient Prescriptions  Medication Sig Dispense Refill  . amLODipine (NORVASC) 10 MG tablet  Take 1 tablet (10 mg total) by mouth daily. 30 tablet 11  . aspirin 81 MG chewable tablet Chew 1 tablet (81 mg total) by mouth daily.    [name] atorvastatin (LIPITOR) 80 MG tablet Take 1 tablet (80 mg total) by mouth daily at 6 PM. 30 tablet 11  . cyclobenzaprine (FLEXERIL) 5 MG tablet Take 1 tablet (5 mg total) by mouth 3 (three) times daily as needed for muscle spasms. 12 tablet 0  . lisinopril-hydrochlorothiazide (PRINZIDE,ZESTORETIC) 20-25 MG tablet Take 1 tablet by mouth daily. 30 tablet 11  . metoprolol (LOPRESSOR) 50 MG tablet Take 1 tablet (50 mg total) by mouth 2 (two) times daily. 60 tablet 11  . nitroGLYCERIN (NITROSTAT) 0.4 MG SL tablet Place 1 tablet (0.4 mg total) under the tongue every 5 (five) minutes as needed for chest pain. 25 tablet 12  . ticagrelor (BRILINTA) 90 MG TABS tablet Take 1 tablet (90 mg total) by mouth 2 (two) times daily. 60 tablet 0  . traMADol (ULTRAM) 50 MG tablet Take 50 mg by mouth at bedtime as needed for moderate pain.      No current facility-administered medications for this visit.    Allergies:   Peanut-containing drug products and Shellfish allergy    Social History:  The patient  reports that he quit smoking 7 days ago. His smoking use included Cigarettes. He has never used smokeless tobacco. He reports that he drinks  alcohol. He reports that he does not use illicit drugs.   Family History:  The patient's family history includes Hypertension in his father, mother, sister, and sister; Sarcoidosis in his mother.    ROS:  Please see the history of present illness. All other systems are reviewed and negative.    PHYSICAL EXAM: VS:  BP 110/78 mmHg  Pulse 67  Ht 5\' 8"  (1.727 m)  Wt 179 lb (81.194 kg)  BMI 27.22 kg/m2 , BMI Body mass index is 27.22 kg/(m^2). GEN: Well nourished, well developed, male in no acute distress HEENT: normal for age  Neck: no JVD, no carotid bruit, no masses Cardiac: RRR; soft murmur, no rubs, or gallops Respiratory:  clear  to auscultation bilaterally, normal work of breathing GI: soft, nontender, nondistended, + BS MS: no deformity or atrophy; no edema; distal pulses are 2+ in all 4 extremities  Skin: warm and dry, no rash Neuro:  Strength and sensation are intact Psych: euthymic mood, full affect   EKG:  EKG is ordered today. The ekg ordered today demonstrates sinus rhythm, evolving MI changes with T-wave inversions in the inferolateral leads   Recent Labs: 03/18/2016: BUN 20; Creatinine, Ser 1.07; Hemoglobin 12.5*; Platelets 377; Potassium 3.3*; Sodium 141    Lipid Panel    Component Value Date/Time   CHOL 228* 03/17/2016 0407   TRIG 380* 03/17/2016 0407   HDL 35* 03/17/2016 0407   CHOLHDL 6.5 03/17/2016 0407   VLDL 76* 03/17/2016 0407   LDLCALC 117* 03/17/2016 0407     Wt Readings from Last 3 Encounters:  03/25/16 179 lb (81.194 kg)  03/16/16 173 lb 1 oz (78.5 kg)  03/09/16 180 lb (81.647 kg)     Other studies Reviewed: Additional studies/ records that were reviewed today include: Hospital records and testing.  ASSESSMENT AND PLAN:  1.  CAD: He is not having any ongoing ischemic symptoms. He has had some minor chest discomfort, but is not significantly bothered by this. He is encouraged to contact us if he has additional symptoms or signs. Continue aspirin, Brilinta, high-dose statin, and beta blocker.  2. Ischemic cardiomyopathy: His EF was 45% by echocardiogram at the time of his MI. He is on lisinopril HCTZ and tolerating this well. He is also on metoprolol.  3. Hypertension: His blood pressure and heart rate are well controlled today, continue current therapy.  4. Hyperlipidemia: He is currently on high-dose statin. He is encouraged to eat a heart healthy diet. Recheck hepatic function and liver testing when he sees Dr. Martinique in 3 months.   Current medicines are reviewed at length with the patient today.  The patient has concerns regarding medicines, specifically, the cost of  Brilinta. This concern was addressed.  The following changes have been made:  no change  Labs/ tests ordered today include:  Orders Placed This Encounter  Procedures  . EKG 12-Lead     Disposition:   FU with Dr. Martinique  Signed, Kenneth Ferries, PA-C  03/25/2016 1:37 PM    [facility] Phone: [phone]; Fax: [phone]  This note was written with the assistance of speech recognition software. Please excuse any transcriptional errors.

## 2016-04-28 ENCOUNTER — Encounter: Payer: Self-pay | Admitting: Internal Medicine

## 2016-04-28 ENCOUNTER — Ambulatory Visit (INDEPENDENT_AMBULATORY_CARE_PROVIDER_SITE_OTHER): Payer: Self-pay | Admitting: Internal Medicine

## 2016-04-28 VITALS — BP 126/82 | HR 78 | Resp 17 | Ht 68.0 in | Wt 182.0 lb

## 2016-04-28 DIAGNOSIS — I2121 ST elevation (STEMI) myocardial infarction involving left circumflex coronary artery: Secondary | ICD-10-CM

## 2016-04-28 DIAGNOSIS — K029 Dental caries, unspecified: Secondary | ICD-10-CM

## 2016-04-28 DIAGNOSIS — I10 Essential (primary) hypertension: Secondary | ICD-10-CM

## 2016-04-28 DIAGNOSIS — J302 Other seasonal allergic rhinitis: Secondary | ICD-10-CM

## 2016-04-28 MED ORDER — CETIRIZINE HCL 10 MG PO TABS: 10.0000 mg | ORAL_TABLET | Freq: Every day | ORAL | Status: DC

## 2016-04-28 NOTE — Progress Notes (Signed)
Subjective:    Patient ID: Kenneth Long, male    DOB: [birth year], 50 y.o.   MRN: [id number]  HPI   1.  CAD:  Had STEMI of CFX 03/16/2016, with stenting.  Continues on Brilinta and Aspirin 81 mg. No chest pain.  Does have dyspnea if walks too far or if goes up stairs.  Sits and relaxes and resolves in minutes. No PND or symptoms of orthopnea.  Needs FLP beginning of August.  We can do that here.  He is not certain what his follow up with Cardiology is--do not see a definitive OV scheduled for the future.  He will call to confirm if in 3 months.  [facility] and waiting to hear back.  He does have his 26 yo granddaughter, Nyoka Cowden, every day and this is causing difficulty in [facility] getting to the [facility]. Discussed importance of getting into pool or bicycling and the [facility] has childcare.   2.  Essential Hypertension: Tolerating meds fine.  Has been monitoring his bp and has been great. Amlodipine, Metoprolol, Lisinopril/HCTZ  3.  Dental Decay: Has been told should not take a break from [employer] for first year.  So may need to put this on hold--will check with dental clinic.    4.  Seasonal allergies:  Itchy watery eyes, runny nose, sneezing, throat itching.  Would be interested in getting Zyrtec from the [facility] pharmacy      Current outpatient prescriptions:  .  amLODipine (NORVASC) 10 MG tablet, Take 1 tablet (10 mg total) by mouth daily., Disp: 30 tablet, Rfl: 11 .  aspirin 81 MG chewable tablet, Chew 1 tablet (81 mg total) by mouth daily., Disp: , Rfl:  .  atorvastatin (LIPITOR) 80 MG tablet, Take 1 tablet (80 mg total) by mouth daily at 6 PM., Disp: 30 tablet, Rfl: 11 .  lisinopril-hydrochlorothiazide (PRINZIDE,ZESTORETIC) 20-25 MG tablet, Take 1 tablet by mouth daily., Disp: 30 tablet, Rfl: 6 .  metoprolol (LOPRESSOR) 50 MG tablet, Take 1 tablet (50 mg total) by mouth 2 (two) times daily., Disp: 60 tablet, Rfl: 11 .  nitroGLYCERIN (NITROSTAT) 0.4 MG SL tablet,  Place 1 tablet (0.4 mg total) under the tongue every 5 (five) minutes as needed for chest pain., Disp: 25 tablet, Rfl: 12 .  ticagrelor (BRILINTA) 90 MG TABS tablet, Take 1 tablet (90 mg total) by mouth 2 (two) times daily., Disp: 60 tablet, Rfl: 0 .  traMADol (ULTRAM) 50 MG tablet, Take 50 mg by mouth at bedtime as needed for moderate pain. , Disp: , Rfl:    Allergies  Allergen Reactions  . Peanut-Containing Drug Products Anaphylaxis and Swelling  . Shellfish Allergy Anaphylaxis and Swelling     Review of Systems     Objective:   Physical Exam HEENT:  PERRL, EOMI, conjunctivae mildly injected with some tearing.   TMs with dullness, nasal mucosa swollen and boggy, unable to see posterior pharynx well.  Dental decay Neck:  Supple, No adenopathy Chest:  CTA CV:  RRR with normal S1 and S2, No S3, S4 or murmur.  Radial and DP pulses normal and equal.  No Carotid bruits. No LE edema ABd:  S, NT, No HSM or mass, +BS       Assessment & Plan:  1.  CAD:  Discussed continuing to improve on risk factors for recurrence.  Needs to find some physical activity his hip tolerates.  Recommended again Y with recumbent bike or walking laps in the  3 feet of pool.   To start slow and gradually increase.  2.  Essential Hypertension:  Controlled.  Continue meds and work on lifestyle changes.  3.  Dental decay:  Will check with Dental clinic regarding whether he can at least get a cleaning without coming off antiplatelet medication.    4.  Seasonal allergies:  Start Zyrtec 10 mg daily.  To let me know if needs to add to this.  Follow up beginning of August for fasting labs with OV 1 week later to discuss results.

## 2016-06-08 ENCOUNTER — Ambulatory Visit: Payer: Self-pay | Admitting: Internal Medicine

## 2016-06-24 ENCOUNTER — Encounter: Payer: Self-pay | Admitting: Physician Assistant

## 2016-06-24 ENCOUNTER — Ambulatory Visit (INDEPENDENT_AMBULATORY_CARE_PROVIDER_SITE_OTHER): Payer: No Typology Code available for payment source | Admitting: Physician Assistant

## 2016-06-24 VITALS — BP 114/72 | HR 97 | Ht 68.0 in | Wt 183.0 lb

## 2016-06-24 DIAGNOSIS — I10 Essential (primary) hypertension: Secondary | ICD-10-CM

## 2016-06-24 DIAGNOSIS — E785 Hyperlipidemia, unspecified: Secondary | ICD-10-CM

## 2016-06-24 DIAGNOSIS — I255 Ischemic cardiomyopathy: Secondary | ICD-10-CM

## 2016-06-24 NOTE — Progress Notes (Signed)
Cardiology Office Note   Date:  06/24/2016   ID:  Kenneth Long, DOB Mar 30, 1966, MRN [id number]  PCP:  Kenneth Hook, MD  Cardiologist:  Dr Kenneth  Stephanie Mcglone, PA-C   Chief Complaint  Patient presents with  . Follow-up    3 months    History of Present Illness: Kenneth Long is a 50 y.o. male with a history of HTN, asthma, hernia, tob use.   Dc 03/30 after admit for CFX STEMI, rx w/ DES. EF 40-45% by echo  [name] presents for 3 month follow-up.  In general, he is doing pretty well. He is trying to increase his activity and doing his walking. However, he struggles because he needs hip replacement. The hip is quite painful and he has not been to cardiac rehabilitation because of this. He needs surgery but understands that he cannot have the surgery until he has been on his Brilinta 4 year. He is walking in the pool 3 times a week for exercise.  He has Brilinta at home that is part of recall. He is no longer taking tablets from the bottle, and wants to know what to do with them. He was able to get additional Brilinta and did not miss any doses, despite the recall.  He has not had chest pain. He quit smoking and has not smoked since he left the hospital. He is doing well with this and does not feel a desire to smoke. He is trying to eat healthier feels he is doing pretty well with that too. He feels that he is breathing better and that his health is better since he left the hospital.  He has a physical job, he was working at the [employer]. It required a lot of walking and lifting as well. He has not been back to work because of his hip, he cannot do the walking. He is applying for disability.   He has had some problems with getting weak and getting a headache and having to lie down. Sister notes that he has some swings in his blood pressure. She remembers a systolic blood pressure in the 90s. That is the time when he feels he needs to lie down and  rest. When she does that he will get better. He has not had chest pain or taken nitroglycerin.   Past Medical History  Diagnosis Date  . Hypertension   . Umbilical hernia   . Asthma   . Heart murmur   . ST elevation (STEMI) myocardial infarction involving left circumflex coronary artery ([name]) 03/16/2016    DES CFX    Past Surgical History  Procedure Laterality Date  . Umbilical hernia repair  123456    umbilical hernia repair  . Cardiac catheterization N/A 03/16/2016    Procedure: Left Heart Cath and Coronary Angiography;  Surgeon: Peter M Martinique, MD;  LAD OK, CFX 100%, OM1 20%, RCA 20%, EF nl  . Cardiac catheterization N/A 03/16/2016    Procedure: Coronary Stent Intervention;  Surgeon: Peter M Martinique, MD; PROMUS PREM MR 3.0X16 mm DES     Current Outpatient Prescriptions  Medication Sig Dispense Refill  . amLODipine (NORVASC) 10 MG tablet Take 1 tablet (10 mg total) by mouth daily. 30 tablet 11  . aspirin 81 MG chewable tablet Chew 1 tablet (81 mg total) by mouth daily.    [name] atorvastatin (LIPITOR) 80 MG tablet Take 1 tablet (80 mg total) by mouth daily at 6 PM. 30 tablet 11  .  cetirizine (ZYRTEC) 10 MG tablet Take 1 tablet (10 mg total) by mouth daily. 30 tablet 11  . lisinopril-hydrochlorothiazide (PRINZIDE,ZESTORETIC) 20-25 MG tablet Take 1 tablet by mouth daily. 30 tablet 6  . metoprolol (LOPRESSOR) 50 MG tablet Take 1 tablet (50 mg total) by mouth 2 (two) times daily. 60 tablet 11  . nitroGLYCERIN (NITROSTAT) 0.4 MG SL tablet Place 1 tablet (0.4 mg total) under the tongue every 5 (five) minutes as needed for chest pain. 25 tablet 12  . ticagrelor (BRILINTA) 90 MG TABS tablet Take 1 tablet (90 mg total) by mouth 2 (two) times daily. 60 tablet 0  . traMADol (ULTRAM) 50 MG tablet Take 50 mg by mouth at bedtime as needed for moderate pain.      No current facility-administered medications for this visit.    Allergies:   Peanut-containing drug products and Shellfish allergy     Social History:  The patient  reports that he quit smoking about 3 months ago. His smoking use included Cigarettes. He has never used smokeless tobacco. He reports that he drinks alcohol. He reports that he does not use illicit drugs.   Family History:  The patient's family history includes Hypertension in his father, mother, sister, and sister; Sarcoidosis in his mother.    ROS:  Please see the history of present illness. All other systems are reviewed and negative.    PHYSICAL EXAM: VS:  BP 114/72 mmHg  Pulse 97  Ht 5\' 8"  (1.727 m)  Wt 183 lb (83.008 kg)  BMI 27.83 kg/m2  SpO2 99% , BMI Body mass index is 27.83 kg/(m^2). GEN: Well nourished, well developed, male in no acute distress HEENT: normal for age  Neck: no JVD, no carotid bruit, no masses Cardiac: RRR; no murmur, no rubs, or gallops Respiratory: Few rales but generally clear bilaterally, normal work of breathing GI: soft, nontender, nondistended, + BS MS: no deformity or atrophy; no edema; distal pulses are 2+ in all 4 extremities  Skin: warm and dry, no rash Neuro:  Strength and sensation are intact Psych: euthymic mood, full affect   EKG:  EKG is ordered today. The ekg ordered today demonstrates sinus rhythm with occasional PVCs, early transition, abnormal T waves seen 03/2016 have improved   Recent Labs: 03/18/2016: BUN 20; Creatinine, Ser 1.07; Hemoglobin 12.5*; Platelets 377; Potassium 3.3*; Sodium 141    Lipid Panel    Component Value Date/Time   CHOL 228* 03/17/2016 0407   TRIG 380* 03/17/2016 0407   HDL 35* 03/17/2016 0407   CHOLHDL 6.5 03/17/2016 0407   VLDL 76* 03/17/2016 0407   LDLCALC 117* 03/17/2016 0407     Wt Readings from Last 3 Encounters:  06/24/16 183 lb (83.008 kg)  04/28/16 182 lb (82.555 kg)  03/25/16 179 lb (81.194 kg)     Other studies Reviewed: Additional studies/ records that were reviewed today include: .  ASSESSMENT AND PLAN:  1.  STEMI involving the circumflex,  02/2016: He is doing well from a cardiac standpoint. He is encouraged to continue to increase his activity as his hip will tolerate. It is okay for him to walk in the water for exercise. I think he needs to increase his strength and endurance before he can go back to work. He is compliant with his medications which include aspirin, Brilinta, high-dose Lipitor, metoprolol, and lisinopril.  2. Hyperlipidemia: It is time to recheck his lipids. He is compliant with his medications and trying to eat a better diet.  3. Hypertension: His  blood pressure is running a little bit low at times. Because of his MI, we will continue the metoprolol and lisinopril/HCTZ current doses, but will decrease the amlodipine to 5 mg daily. If this does not control his blood pressure, we can consider decreasing the HCTZ part of his, a drug, but I would like to keep the lisinopril at current dose.  4. Tobacco use: He quit smoking when he went into the hospital and has not started back. He was congratulated on this and encouraged to continue it.  5. Medication recall: He was advised that the Brilinta tablets all look like. He is welcome to either throw out throw out the bottles of medicine that have been recalled. Or, he can look at the tablets and if they look like the Brilinta tablets he has in his other bottles, he can continue to use them. In the tablet that looks different, toss it.   Current medicines are reviewed at length with the patient today.  The patient has concerns regarding medicines. Concerns were addressed  The following changes have been made:  Decrease amlodipine  Labs/ tests ordered today include:   Orders Placed This Encounter  Procedures  . EKG 12-Lead     Disposition:   FU with Dr. Martinique  Signed, Rosaria Ferries, PA-C  06/24/2016 1:07 PM    [place] Phone: [phone]; Fax: [phone]  This note was written with the assistance of speech recognition software.  Please excuse any transcriptional errors.

## 2016-06-24 NOTE — Patient Instructions (Signed)
Your physician recommends that you schedule a follow-up appointment in: 3 MONTHS WITH DR Martinique

## 2016-07-20 ENCOUNTER — Other Ambulatory Visit: Payer: Self-pay

## 2016-07-22 ENCOUNTER — Other Ambulatory Visit (INDEPENDENT_AMBULATORY_CARE_PROVIDER_SITE_OTHER): Payer: Self-pay

## 2016-07-22 DIAGNOSIS — E785 Hyperlipidemia, unspecified: Secondary | ICD-10-CM

## 2016-07-22 DIAGNOSIS — I10 Essential (primary) hypertension: Secondary | ICD-10-CM

## 2016-07-23 LAB — COMPREHENSIVE METABOLIC PANEL
ALBUMIN: 4.8 g/dL (ref 3.5–5.5)
ALT: 57 IU/L — ABNORMAL HIGH (ref 0–44)
AST: 35 IU/L (ref 0–40)
Albumin/Globulin Ratio: 1.5 (ref 1.2–2.2)
Alkaline Phosphatase: 81 IU/L (ref 39–117)
BUN / CREAT RATIO: 13 (ref 9–20)
BUN: 12 mg/dL (ref 6–24)
Bilirubin Total: 0.4 mg/dL (ref 0.0–1.2)
CALCIUM: 10.2 mg/dL (ref 8.7–10.2)
CO2: 21 mmol/L (ref 18–29)
CREATININE: 0.95 mg/dL (ref 0.76–1.27)
Chloride: 97 mmol/L (ref 96–106)
GFR, EST AFRICAN AMERICAN: 109 mL/min/{1.73_m2} (ref >59)
GFR, EST NON AFRICAN AMERICAN: 94 mL/min/{1.73_m2} (ref >59)
GLOBULIN, TOTAL: 3.2 g/dL (ref 1.5–4.5)
Glucose: 98 mg/dL (ref 65–99)
Potassium: 3.7 mmol/L (ref 3.5–5.2)
SODIUM: 138 mmol/L (ref 134–144)
Total Protein: 8 g/dL (ref 6.0–8.5)

## 2016-07-23 LAB — CBC WITH DIFFERENTIAL/PLATELET
Basophils Absolute: 0.1 x10E3/uL (ref 0.0–0.2)
Basos: 1 %
EOS (ABSOLUTE): 0.4 x10E3/uL (ref 0.0–0.4)
Eos: 4 %
Hematocrit: 35 % — ABNORMAL LOW (ref 37.5–51.0)
Hemoglobin: 11.7 g/dL — ABNORMAL LOW (ref 12.6–17.7)
Immature Grans (Abs): 0 x10E3/uL (ref 0.0–0.1)
Immature Granulocytes: 0 %
Lymphocytes Absolute: 2.7 x10E3/uL (ref 0.7–3.1)
Lymphs: 31 %
MCH: 29.4 pg (ref 26.6–33.0)
MCHC: 33.4 g/dL (ref 31.5–35.7)
MCV: 88 fL (ref 79–97)
Monocytes Absolute: 0.4 x10E3/uL (ref 0.1–0.9)
Monocytes: 5 %
Neutrophils Absolute: 5.3 x10E3/uL (ref 1.4–7.0)
Neutrophils: 59 %
Platelets: 368 x10E3/uL (ref 150–379)
RBC: 3.98 x10E6/uL — ABNORMAL LOW (ref 4.14–5.80)
RDW: 14 % (ref 12.3–15.4)
WBC: 8.8 x10E3/uL (ref 3.4–10.8)

## 2016-07-23 LAB — LIPID PANEL W/O CHOL/HDL RATIO
Cholesterol, Total: 151 mg/dL (ref 100–199)
HDL: 27 mg/dL — ABNORMAL LOW
Triglycerides: 788 mg/dL (ref 0–149)

## 2016-07-27 ENCOUNTER — Ambulatory Visit (INDEPENDENT_AMBULATORY_CARE_PROVIDER_SITE_OTHER): Payer: Self-pay | Admitting: Internal Medicine

## 2016-07-27 ENCOUNTER — Encounter: Payer: Self-pay | Admitting: Internal Medicine

## 2016-07-27 VITALS — BP 120/70 | HR 66 | Resp 16 | Ht 68.0 in | Wt 182.0 lb

## 2016-07-27 DIAGNOSIS — Z23 Encounter for immunization: Secondary | ICD-10-CM

## 2016-07-27 DIAGNOSIS — E785 Hyperlipidemia, unspecified: Secondary | ICD-10-CM

## 2016-07-27 DIAGNOSIS — M1990 Unspecified osteoarthritis, unspecified site: Secondary | ICD-10-CM

## 2016-07-27 DIAGNOSIS — M1610 Unilateral primary osteoarthritis, unspecified hip: Secondary | ICD-10-CM

## 2016-07-27 DIAGNOSIS — I255 Ischemic cardiomyopathy: Secondary | ICD-10-CM

## 2016-07-27 DIAGNOSIS — M7580 Other shoulder lesions, unspecified shoulder: Secondary | ICD-10-CM

## 2016-07-27 MED ORDER — FISH OIL 1000 MG PO CAPS: ORAL_CAPSULE | ORAL | 0 refills | Status: DC

## 2016-07-27 MED ORDER — TRAMADOL HCL 50 MG PO TABS: 50.0000 mg | ORAL_TABLET | Freq: Every evening | ORAL | 0 refills | Status: DC | PRN

## 2016-07-27 NOTE — Patient Instructions (Addendum)
Salmon, mackerel, avocado Fish oil Capsules 1000 mg twice daily. Do range of motion exercise for your shoulders for about 10 minutes twice daily. Get a bike or stationary bike and gradually increase cycling.   Do not exercise to the point where you need to nap afterward

## 2016-07-27 NOTE — Progress Notes (Signed)
Subjective:    Patient ID: Kenneth Long, male    DOB: 11-23-1966, 50 y.o.   MRN: [id number]  HPI   1.  CAD:  Pt. Started walking regularly for about 1 mile with increased hip pain. Could not walk in the pool any longer as could not get to class.  States much less pain with walking in the pool.   Has not tried cycling or stationary bike.   No smoking since March No chest pain, though notes some heavy breathing when walking for a while.  Fatigued after walking.  2.  Hyperlipidemia:  Pt. Did have cranberry juice the day he came in for [place] on 8/3/017.  His total cholesterol dropped nicely to 151.   Allergic to nuts and shellfish, but can eat fish.    3.  Mild Anemia:  Has never done Hemoccult cards.  Taking [place].  Has not noted any blood in stool.    4.  Bilateral shoulders bothering him.  For 1 month.  Feels he is sleeping wrong.  Notes popping in his shoulders with pain.  Tramadol helps with sleep.  Otherwise he is tossing and turning with the pain.   Current Meds  Medication Sig  . amLODipine (NORVASC) 10 MG tablet Take 1 tablet (10 mg total) by mouth daily.  [name] aspirin 81 MG chewable tablet Chew 1 tablet (81 mg total) by mouth daily.  [name] atorvastatin (LIPITOR) 80 MG tablet Take 1 tablet (80 mg total) by mouth daily at 6 PM.  . cetirizine (ZYRTEC) 10 MG tablet Take 1 tablet (10 mg total) by mouth daily.  [name] lisinopril-hydrochlorothiazide (PRINZIDE,ZESTORETIC) 20-25 MG tablet Take 1 tablet by mouth daily.  . metoprolol (LOPRESSOR) 50 MG tablet Take 1 tablet (50 mg total) by mouth 2 (two) times daily.  . nitroGLYCERIN (NITROSTAT) 0.4 MG SL tablet Place 1 tablet (0.4 mg total) under the tongue every 5 (five) minutes as needed for chest pain.  . ticagrelor (BRILINTA) 90 MG TABS tablet Take 1 tablet (90 mg total) by mouth 2 (two) times daily.  . traMADol (ULTRAM) 50 MG tablet Take 1 tablet (50 mg total) by mouth at bedtime as needed for moderate pain.  . [DISCONTINUED] traMADol  (ULTRAM) 50 MG tablet Take 50 mg by mouth at bedtime as needed for moderate pain.    Allergies  Allergen Reactions  . Peanut-Containing Drug Products Anaphylaxis and Swelling  . Shellfish Allergy Anaphylaxis and Swelling    Review of Systems     Objective:   Physical Exam NAD Lungs:  CTA CV:  RRR without murmur or rub, radial pulses normal and equal. Abd:  S, NT, No HSM or mass, + BS LE:  No edema Shoulders:  Mild tenderness in subacromial bursa area.  NT over traps, cervical paraspinous area bilaterally.  Increased pain and decreased resistance with downward force on internally rotated 90 degree abducted shoulders.  No erythema or swelling.  Full ROM, but pain at 90 degrees abduction with abduction and adduction of shoulders.       Assessment & Plan:  1.  Bilateral rotator cuff tendinitis:  Physical therapy, Tramadol as needed.  To perform ROM exercises until into PT.  2.  CAD:  Pain in hip limiting gradual increase of physical activity.  Strongly urged to fill out [facility] scholarship or find another pool or stationary bike for physical activity rather than walking due to pain in hip.  3.  Hyperlipidemia:  To add OTC Fish Oil to Atorvastatin twice daily.  Discussed recommended dietary changes as well. PHysical activity to gradually increase as tolerated as above as well.  4.  Mild Anemia;  Hemoccult cards, to return in 2 weeks.  5.  HM:  Tdap today.  Recommend flu vaccine in fall.  FU 2 months

## 2016-10-24 ENCOUNTER — Encounter: Payer: Self-pay | Admitting: Internal Medicine

## 2017-01-19 IMAGING — DX DG HIP (WITH OR WITHOUT PELVIS) 2-3V*R*
3 series · 3 of 3 positions shown · non-contrast
Comparison: None.

CLINICAL DATA: Right hip pain for several months

EXAM:
DG HIP (WITH OR WITHOUT PELVIS) 2-3V RIGHT

[pelvis ap]
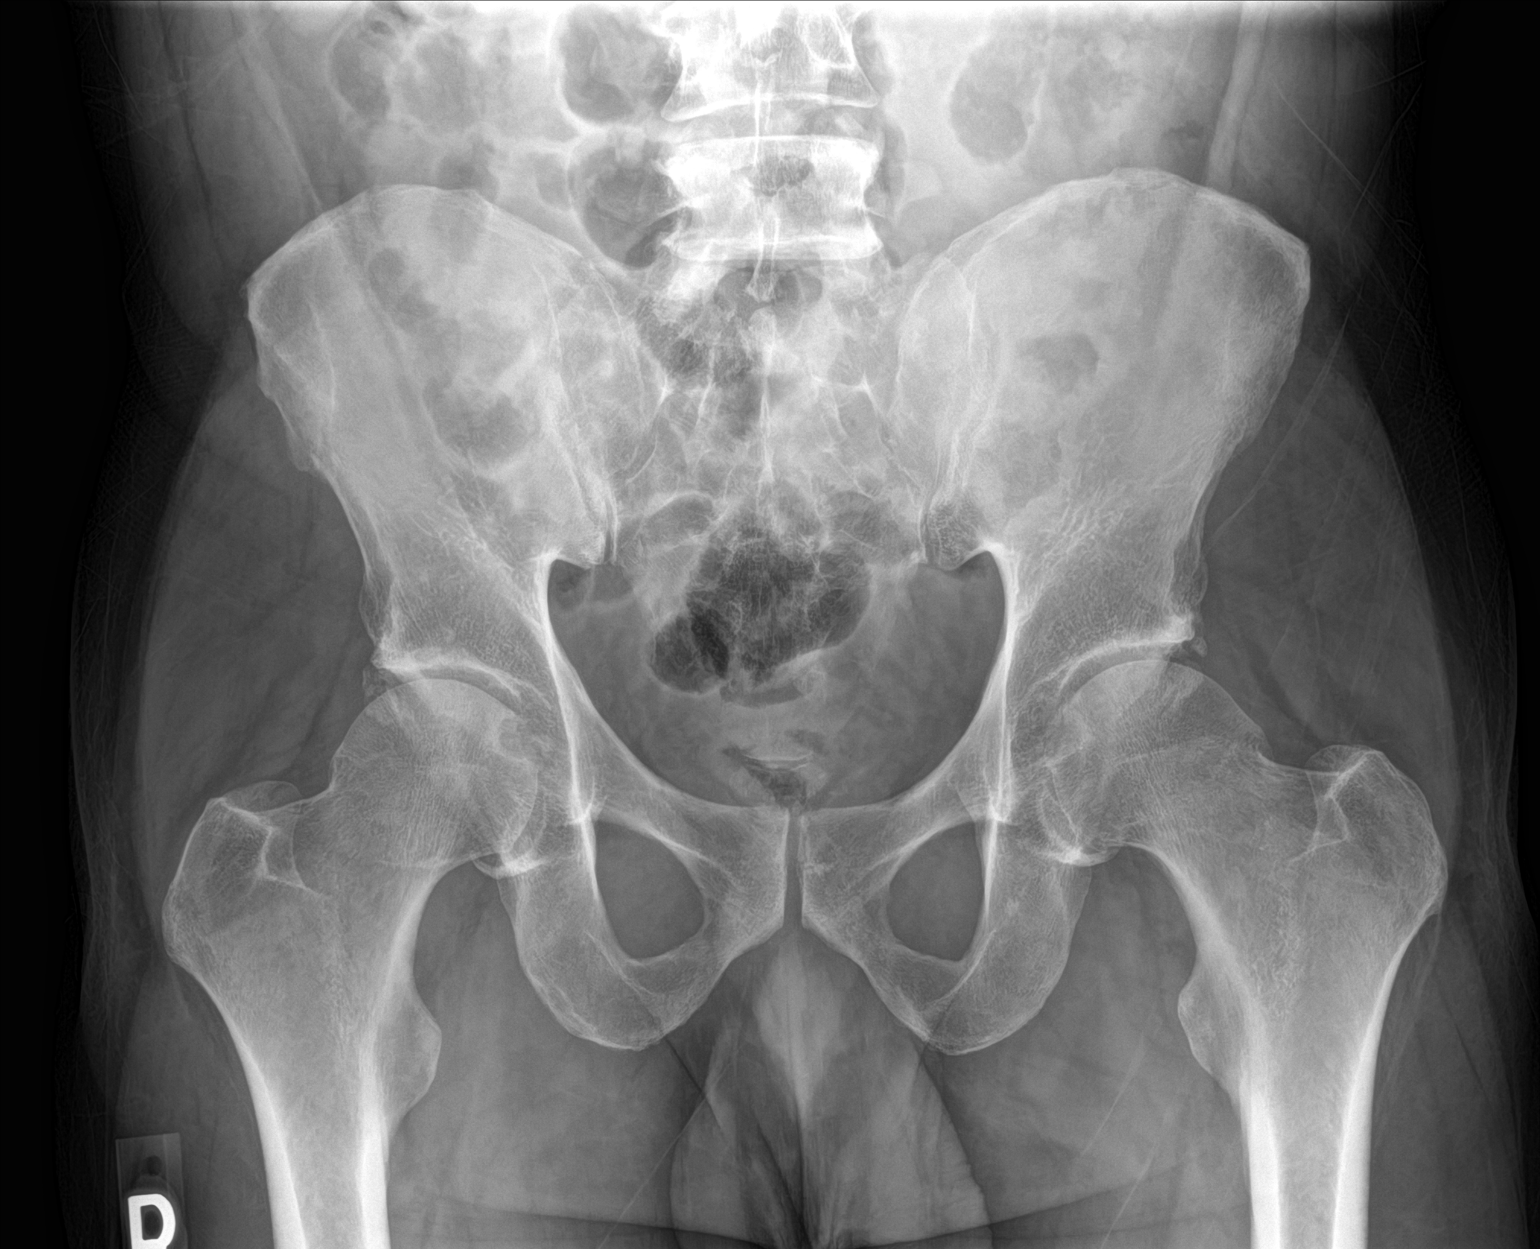

[hip ap]
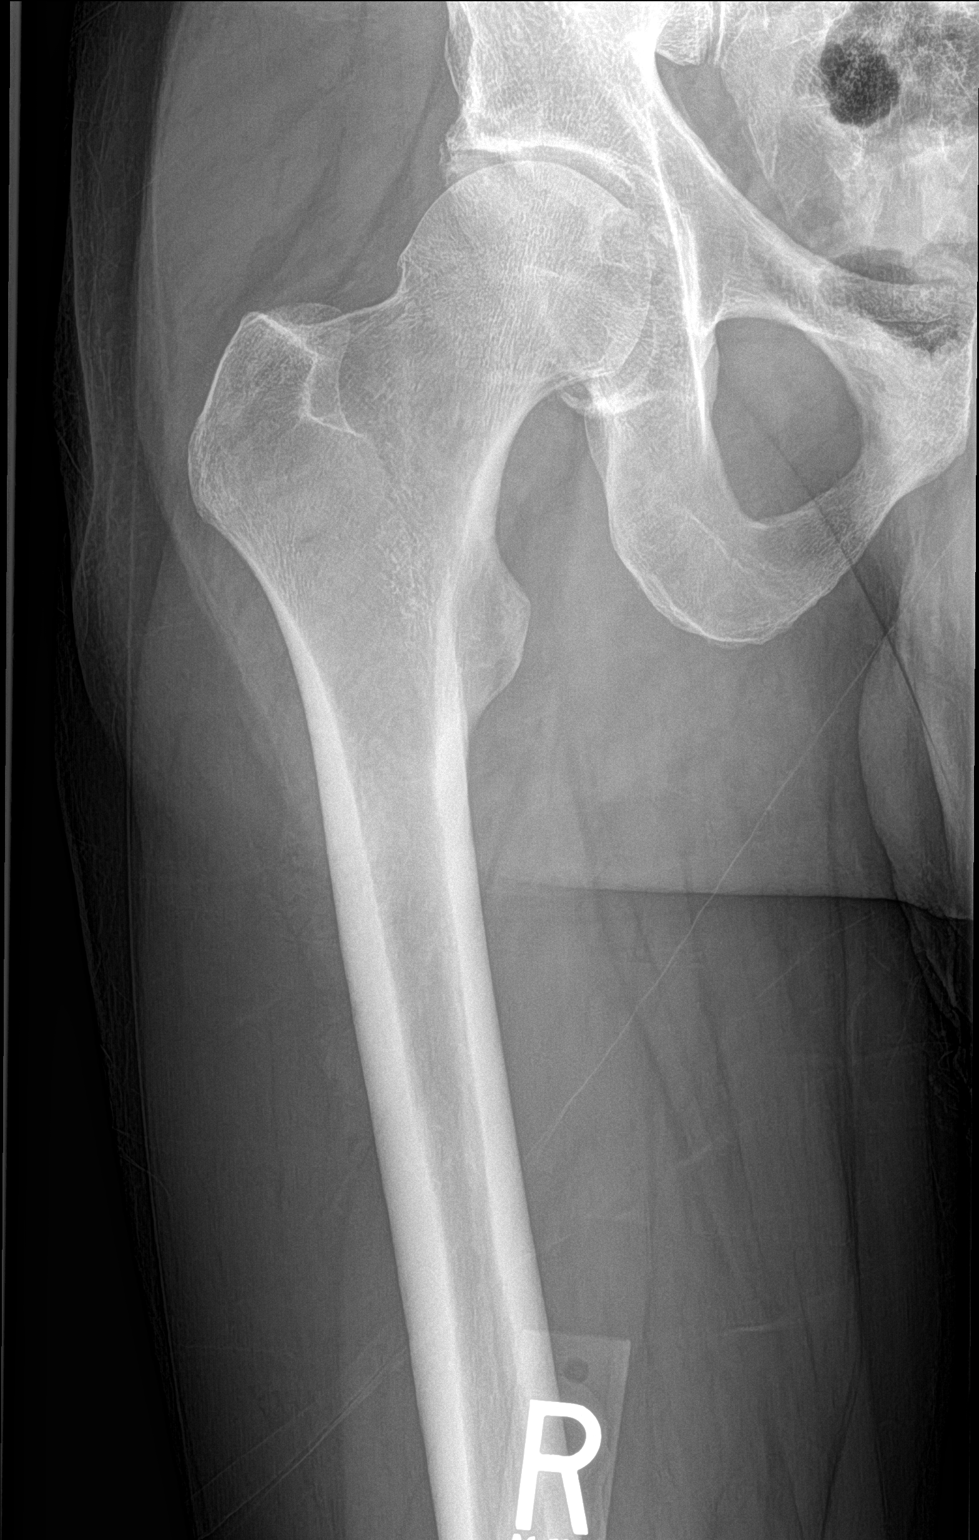

[hip lat]
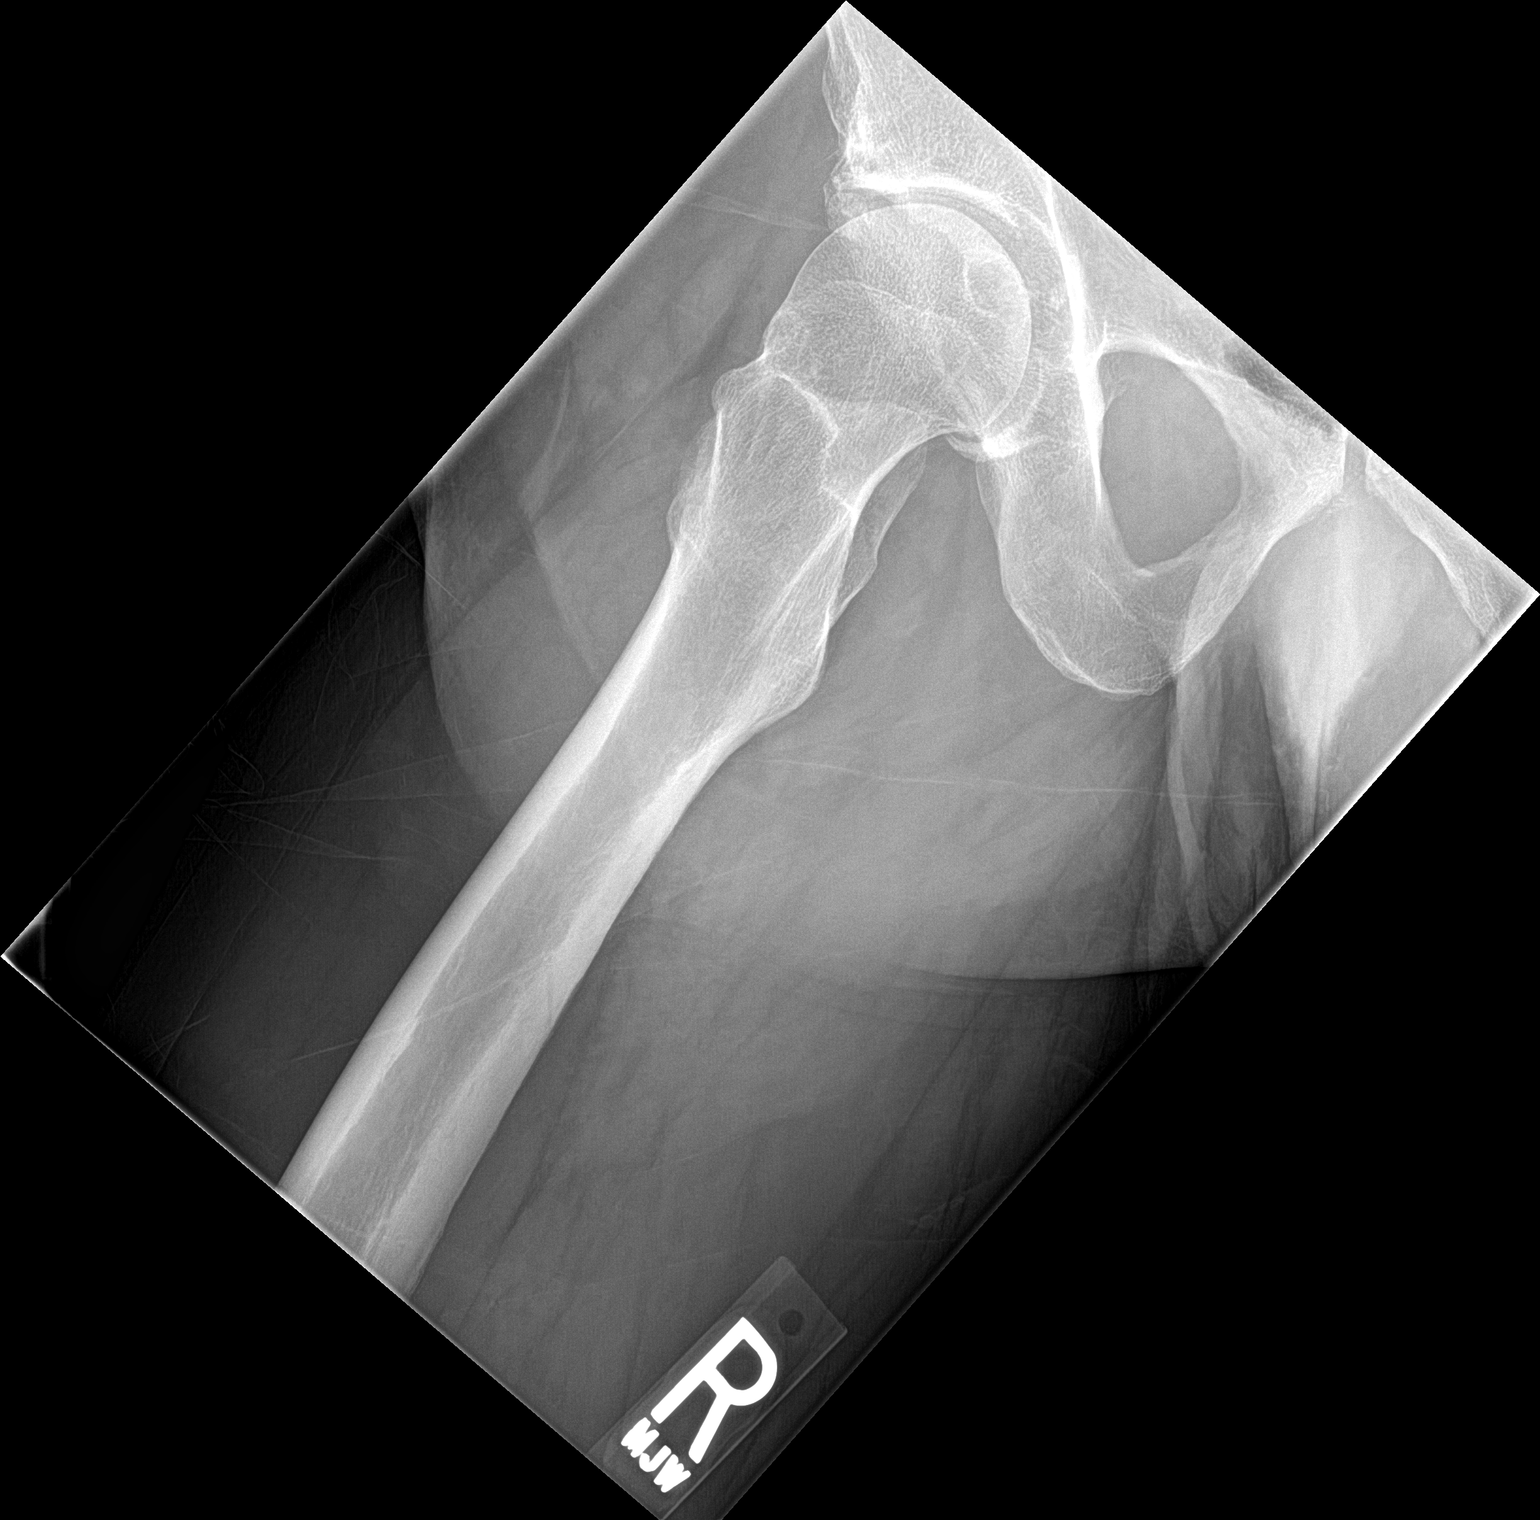

[3 of 3 positions shown; findings below may reference images not displayed]

FINDINGS: There is no evidence of hip fracture or dislocation. The joint
spaces are relatively well maintained. There is osseous prominence
of the superior femoral head neck junction bilaterally as can be
seen with femoroacetabular impingement.
IMPRESSION: No acute osseous injury of the right hip.

## 2017-03-03 ENCOUNTER — Ambulatory Visit (INDEPENDENT_AMBULATORY_CARE_PROVIDER_SITE_OTHER): Payer: No Typology Code available for payment source | Admitting: Physician Assistant

## 2017-03-03 ENCOUNTER — Encounter: Payer: Self-pay | Admitting: Physician Assistant

## 2017-03-03 VITALS — BP 140/93 | HR 81 | Ht 69.0 in | Wt 167.6 lb

## 2017-03-03 DIAGNOSIS — I2510 Atherosclerotic heart disease of native coronary artery without angina pectoris: Secondary | ICD-10-CM

## 2017-03-03 DIAGNOSIS — E785 Hyperlipidemia, unspecified: Secondary | ICD-10-CM

## 2017-03-03 DIAGNOSIS — I10 Essential (primary) hypertension: Secondary | ICD-10-CM

## 2017-03-03 DIAGNOSIS — Z79899 Other long term (current) drug therapy: Secondary | ICD-10-CM

## 2017-03-03 DIAGNOSIS — Z01818 Encounter for other preprocedural examination: Secondary | ICD-10-CM

## 2017-03-03 NOTE — Progress Notes (Signed)
Cardiology Office Note   Date:  03/03/2017   ID:  Kenneth Long, DOB [birth year], MRN [id number]  PCP:  Mack Hook, MD  Cardiologist:  Dr. Martinique, saw in hospital 03/16/2016  Lenoard Aden 06/24/2016  Chief Complaint  Patient presents with  . Shortness of Breath    occasionally.  . Edema    feet    History of Present Illness: Kenneth Long is a 51 y.o. male with a history of HTN, asthma, hernia, tob use, 02/2016 STEMI rx w/ DES CFX. EF 40-45% by echo 02/2016  Kenneth Long presents for cardiology follow up.  He needs a hip replacement, cannot have it till he is off the Brilinta. He wonders when he can do that.  He has gotten better at doing stairs, but still gets SOB. Minimizes stair use because of his hip. He does not exert himself much due to MS issues.   He is taking meds as directed.   He is waking up with pedal edema,But it is mild and does not get worse during the day.  He is still off tobacco. He is still eating healthier. He has not eaten today. He has not taken his meds today.   He is having some dyspnea on exertion, but understands that he has not been able to really come back after his MI because of his hip. The dyspnea on exertion has not worsened over time. He is not having orthopnea or PND. The amount of pedal edema has not changed recently.  He is not having any chest pain.   Past Medical History:  Diagnosis Date  . Asthma   . Heart murmur   . Hypertension   . ST elevation (STEMI) myocardial infarction involving left circumflex coronary artery ([place]) 03/16/2016   DES CFX  . Umbilical hernia     Past Surgical History:  Procedure Laterality Date  . CARDIAC CATHETERIZATION N/A 03/16/2016   Procedure: Left Heart Cath and Coronary Angiography;  Surgeon: Peter M Martinique, MD;  LAD OK, CFX 100%, OM1 20%, RCA 20%, EF nl  . CARDIAC CATHETERIZATION N/A 03/16/2016   Procedure: Coronary Stent Intervention;  Surgeon: Peter M  Martinique, MD; PROMUS PREM MR 3.0X16 mm DES   . UMBILICAL HERNIA REPAIR  5329   umbilical hernia repair    Current Outpatient Prescriptions  Medication Sig Dispense Refill  . amLODipine (NORVASC) 10 MG tablet Take 1 tablet (10 mg total) by mouth daily. 30 tablet 11  . aspirin 81 MG chewable tablet Chew 1 tablet (81 mg total) by mouth daily.    [name] atorvastatin (LIPITOR) 80 MG tablet Take 1 tablet (80 mg total) by mouth daily at 6 PM. 30 tablet 11  . cetirizine (ZYRTEC) 10 MG tablet Take 1 tablet (10 mg total) by mouth daily. 30 tablet 11  . lisinopril-hydrochlorothiazide (PRINZIDE,ZESTORETIC) 20-25 MG tablet Take 1 tablet by mouth daily. 30 tablet 6  . metoprolol (LOPRESSOR) 50 MG tablet Take 1 tablet (50 mg total) by mouth 2 (two) times daily. 60 tablet 11  . nitroGLYCERIN (NITROSTAT) 0.4 MG SL tablet Place 1 tablet (0.4 mg total) under the tongue every 5 (five) minutes as needed for chest pain. 25 tablet 12  . Omega-3 Fatty Acids (FISH OIL) 1000 MG CAPS 1 cap twice daily  0  . ticagrelor (BRILINTA) 90 MG TABS tablet Take 1 tablet (90 mg total) by mouth 2 (two) times daily. 60 tablet 0  . traMADol (ULTRAM) 50 MG tablet Take 1 tablet (50  mg total) by mouth at bedtime as needed for moderate pain. 30 tablet 0   No current facility-administered medications for this visit.     Allergies:   Peanut-containing drug products and Shellfish allergy    Social History:  The patient  reports that he quit smoking about a year ago. His smoking use included Cigarettes. He has never used smokeless tobacco. He reports that he drinks alcohol. He reports that he does not use drugs.   Family History:  The patient's family history includes Hypertension in his father, mother, sister, and sister; Sarcoidosis in his mother.    ROS:  Please see the history of present illness. All other systems are reviewed and negative.    PHYSICAL EXAM: VS:  BP (!) 140/93   Pulse 81   Ht 5\' 9"  (1.753 m)   Wt 167 lb 9.6 oz (76  kg)   BMI 24.75 kg/m  , BMI Body mass index is 24.75 kg/m. GEN: Well nourished, well developed, male in no acute distress  HEENT: normal for age  Neck: Minimal JVD, no carotid bruit, no masses Cardiac: RRR; soft murmur, no rubs, or gallops Respiratory:  clear to auscultation bilaterally, normal work of breathing GI: soft, nontender, nondistended, + BS MS: no deformity or atrophy; trace pedal edema; distal pulses are 2+ in all 4 extremities   Skin: warm and dry, no rash Neuro:  Strength and sensation are intact Psych: euthymic mood, full affect   EKG:  EKG is not ordered today.   Recent Labs: 03/18/2016: Hemoglobin 12.5 07/22/2016: ALT 57; BUN 12; Creatinine, Ser 0.95; Platelets 368; Potassium 3.7; Sodium 138    Lipid Panel    Component Value Date/Time   CHOL 151 07/22/2016 1016   TRIG 788 (HH) 07/22/2016 1016   HDL 27 (L) 07/22/2016 1016   CHOLHDL 6.5 03/17/2016 0407   VLDL 76 (H) 03/17/2016 0407   LDLCALC Comment 07/22/2016 1016     Wt Readings from Last 3 Encounters:  03/03/17 167 lb 9.6 oz (76 kg)  07/27/16 182 lb (82.6 kg)  06/24/16 183 lb (83 kg)     Other studies Reviewed: Additional studies/ records that were reviewed today include: Office notes, hospital records and testing.  ASSESSMENT AND PLAN:  1.  CAD: He is on good medical therapy with aspirin, Brilinta, high-dose Lipitor, metoprolol, ACE inhibitor/diuretic and fish oil. Continue medications.  Dr. Martinique to advise if he can come off the Brilinta a year out from his stent long enough to have a hip replacement  2. Hypertension: His blood pressure is elevated today, but he has not yet had his medications. Continue medicines at the current doses for now.  3. Hyperlipidemia with hypertriglyceridemia: He is compliant with his medications, has not had recent labs and has not eaten today. We will check a complete metabolic profile and a lipid profile.  4. Lifestyle: His lifestyle is improved greatly from his  pre-MI situation. He is encouraged to continue the current changes.  5. Preoperative evaluation: Although he has some dyspnea on exertion, it has not changed recently. He is having no signs or symptoms of heart failure. 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 He goes slowly because of his musculoskeletal issues. At this time, he has no surgical contraindication to hip replacement.  Current medicines are reviewed at length with the patient today.  The patient has concerns regarding medicines. Concerns were addressed  The following changes have been made:  no change  Labs/ tests ordered today include:   Orders Placed This Encounter  Procedures  . Comprehensive metabolic panel  . Lipid panel     Disposition:   FU with Dr. Martinique  Signed, Rosaria Ferries, PA-C  03/03/2017 1:38 PM    [facility] Phone: [phone]; Fax: [phone]  This note was written with the assistance of speech recognition software. Please excuse any transcriptional errors.

## 2017-03-03 NOTE — Patient Instructions (Signed)
Medication Instructions:  Your physician recommends that you continue on your current medications as directed. Please refer to the Current Medication list given to you today.  If you need a refill on your cardiac medications before your next appointment, please call your pharmacy.  Labwork: CMET AND LIPID PANEL TODAY AT [facility] ON THE 1ST FLOOR  Follow-Up: Your physician wants you to follow-up in: 6 MONTHS WITH DR Martinique You will receive a reminder letter in the mail two months in advance. If you don't receive a letter, please call our office APRIL 2018 to schedule the June 2018 follow-up appointment.   Special Instructions: RHONDA WILL ASK DR Martinique ABOUT BRILINTA     Thank you for choosing [facility] at [facility]!!    RHONDA BARRETT, Revonda Humphrey, [place]

## 2017-03-07 ENCOUNTER — Telehealth: Payer: Self-pay

## 2017-03-07 NOTE — Telephone Encounter (Signed)
Per Dr Martinique, He can come off Brilinta for hip surgery. Looks like he can stop Brilinta now since he is one year out from stent  Pt notified he will stop Brilinta

## 2017-03-11 NOTE — Progress Notes (Signed)
Please let the pt know that Dr Martinique has cleared him to come off the Brilinta and have his hip surgery. Thanks

## 2017-03-22 ENCOUNTER — Telehealth: Payer: Self-pay | Admitting: Internal Medicine

## 2017-03-22 NOTE — Telephone Encounter (Signed)
Health Dept. Sending fax of Rx needed as stated by patient.

## 2017-03-23 ENCOUNTER — Other Ambulatory Visit: Payer: Self-pay

## 2017-03-23 DIAGNOSIS — I10 Essential (primary) hypertension: Secondary | ICD-10-CM

## 2017-03-23 MED ORDER — AMLODIPINE BESYLATE 10 MG PO TABS: 10.0000 mg | ORAL_TABLET | Freq: Every day | ORAL | 11 refills | Status: DC

## 2017-03-23 MED ORDER — METOPROLOL TARTRATE 50 MG PO TABS: 50.0000 mg | ORAL_TABLET | Freq: Two times a day (BID) | ORAL | 11 refills | Status: DC

## 2017-03-23 NOTE — Telephone Encounter (Signed)
Rx's sent to pharmacy. Please notify patient. 

## 2017-03-24 ENCOUNTER — Other Ambulatory Visit (INDEPENDENT_AMBULATORY_CARE_PROVIDER_SITE_OTHER): Payer: Self-pay

## 2017-03-24 ENCOUNTER — Telehealth: Payer: Self-pay

## 2017-03-24 DIAGNOSIS — E785 Hyperlipidemia, unspecified: Secondary | ICD-10-CM

## 2017-03-24 DIAGNOSIS — Z79899 Other long term (current) drug therapy: Secondary | ICD-10-CM

## 2017-03-24 NOTE — Telephone Encounter (Signed)
-----   Message from Lonn Georgia, PA-C sent at 03/11/2017 11:41 AM EDT -----   ----- Message ----- From: Peter M Martinique, MD Sent: 03/03/2017   4:06 PM To: Lonn Georgia, PA-C  He can come off Brilinta for hip surgery. Looks like he can stop Brilinta now since he is one year out from stent  Kenneth Long ----- Message ----- From: Lonn Georgia, PA-C Sent: 03/03/2017   1:41 PM To: Peter M Martinique, MD

## 2017-03-24 NOTE — Telephone Encounter (Signed)
Spoke to patient follow up visit scheduled wit Dr.Jordan 06/07/17 at 10:20 am.

## 2017-03-25 ENCOUNTER — Telehealth: Payer: Self-pay | Admitting: Cardiology

## 2017-03-25 DIAGNOSIS — E876 Hypokalemia: Secondary | ICD-10-CM

## 2017-03-25 LAB — COMPREHENSIVE METABOLIC PANEL
ALBUMIN: 4.4 g/dL (ref 3.5–5.5)
ALK PHOS: 83 IU/L (ref 39–117)
ALT: 23 IU/L (ref 0–44)
AST: 22 IU/L (ref 0–40)
Albumin/Globulin Ratio: 1.5 (ref 1.2–2.2)
BILIRUBIN TOTAL: 0.3 mg/dL (ref 0.0–1.2)
BUN / CREAT RATIO: 11 (ref 9–20)
BUN: 12 mg/dL (ref 6–24)
CHLORIDE: 98 mmol/L (ref 96–106)
CO2: 24 mmol/L (ref 18–29)
CREATININE: 1.1 mg/dL (ref 0.76–1.27)
Calcium: 9.5 mg/dL (ref 8.7–10.2)
GFR calc non Af Amer: 78 mL/min/{1.73_m2} (ref >59)
GFR, EST AFRICAN AMERICAN: 91 mL/min/{1.73_m2} (ref >59)
GLOBULIN, TOTAL: 3 g/dL (ref 1.5–4.5)
Glucose: 101 mg/dL — ABNORMAL HIGH (ref 65–99)
Potassium: 3.2 mmol/L — ABNORMAL LOW (ref 3.5–5.2)
SODIUM: 139 mmol/L (ref 134–144)
TOTAL PROTEIN: 7.4 g/dL (ref 6.0–8.5)

## 2017-03-25 LAB — LIPID PANEL W/O CHOL/HDL RATIO
Cholesterol, Total: 148 mg/dL (ref 100–199)
HDL: 35 mg/dL — AB (ref >39)
TRIGLYCERIDES: 534 mg/dL — AB (ref 0–149)

## 2017-03-25 MED ORDER — POTASSIUM CHLORIDE CRYS ER 20 MEQ PO TBCR: EXTENDED_RELEASE_TABLET | ORAL | 6 refills | Status: DC

## 2017-03-25 NOTE — Telephone Encounter (Signed)
New message    Pt is returning call to [facility]

## 2017-03-25 NOTE — Telephone Encounter (Signed)
Returned call to patient advised of bmet results.Potassium low 3.2.Spoke to DOD Dr.Harding he advised to start KDur 40 meq daily for 2 days then 20 meq daily.Repeat bmet in 1 week.Patient will have done at PCP,he will have her fax results to office fax # [phone].

## 2017-04-01 ENCOUNTER — Other Ambulatory Visit (INDEPENDENT_AMBULATORY_CARE_PROVIDER_SITE_OTHER): Payer: Self-pay

## 2017-04-01 DIAGNOSIS — Z79899 Other long term (current) drug therapy: Secondary | ICD-10-CM

## 2017-04-02 LAB — BASIC METABOLIC PANEL
BUN/Creatinine Ratio: 16 (ref 9–20)
BUN: 19 mg/dL (ref 6–24)
CALCIUM: 9.5 mg/dL (ref 8.7–10.2)
CO2: 19 mmol/L (ref 18–29)
CREATININE: 1.16 mg/dL (ref 0.76–1.27)
Chloride: 98 mmol/L (ref 96–106)
GFR calc Af Amer: 85 mL/min/{1.73_m2} (ref >59)
GFR calc non Af Amer: 74 mL/min/{1.73_m2} (ref >59)
GLUCOSE: 88 mg/dL (ref 65–99)
POTASSIUM: 3.9 mmol/L (ref 3.5–5.2)
SODIUM: 138 mmol/L (ref 134–144)

## 2017-04-06 ENCOUNTER — Other Ambulatory Visit: Payer: Self-pay

## 2017-04-06 ENCOUNTER — Other Ambulatory Visit: Payer: Self-pay | Admitting: Internal Medicine

## 2017-04-06 DIAGNOSIS — I10 Essential (primary) hypertension: Secondary | ICD-10-CM

## 2017-04-06 MED ORDER — ATORVASTATIN CALCIUM 80 MG PO TABS: 80.0000 mg | ORAL_TABLET | Freq: Every day | ORAL | 11 refills | Status: DC

## 2017-04-06 NOTE — Telephone Encounter (Signed)
Rx(s) sent to pharmacy electronically.  

## 2017-04-25 ENCOUNTER — Ambulatory Visit (INDEPENDENT_AMBULATORY_CARE_PROVIDER_SITE_OTHER): Payer: Self-pay | Admitting: Internal Medicine

## 2017-04-25 ENCOUNTER — Encounter: Payer: Self-pay | Admitting: Internal Medicine

## 2017-04-25 VITALS — BP 128/82 | HR 82 | Resp 12 | Ht 68.0 in | Wt 161.0 lb

## 2017-04-25 DIAGNOSIS — I10 Essential (primary) hypertension: Secondary | ICD-10-CM

## 2017-04-25 DIAGNOSIS — E785 Hyperlipidemia, unspecified: Secondary | ICD-10-CM

## 2017-04-25 DIAGNOSIS — J302 Other seasonal allergic rhinitis: Secondary | ICD-10-CM | POA: Insufficient documentation

## 2017-04-25 DIAGNOSIS — M1610 Unilateral primary osteoarthritis, unspecified hip: Secondary | ICD-10-CM

## 2017-04-25 DIAGNOSIS — I2510 Atherosclerotic heart disease of native coronary artery without angina pectoris: Secondary | ICD-10-CM | POA: Insufficient documentation

## 2017-04-25 DIAGNOSIS — J301 Allergic rhinitis due to pollen: Secondary | ICD-10-CM

## 2017-04-25 MED ORDER — FEXOFENADINE HCL 180 MG PO TABS: 180.0000 mg | ORAL_TABLET | Freq: Every day | ORAL | Status: DC

## 2017-04-25 MED ORDER — GEMFIBROZIL 600 MG PO TABS: 600.0000 mg | ORAL_TABLET | Freq: Two times a day (BID) | ORAL | 11 refills | Status: DC

## 2017-04-25 MED ORDER — ALBUTEROL SULFATE HFA 108 (90 BASE) MCG/ACT IN AERS: 2.0000 | INHALATION_SPRAY | Freq: Four times a day (QID) | RESPIRATORY_TRACT | 0 refills | Status: DC | PRN

## 2017-04-25 MED ORDER — MOMETASONE FUROATE 50 MCG/ACT NA SUSP: NASAL | 12 refills | Status: DC

## 2017-04-25 NOTE — Patient Instructions (Signed)
Stop ATorvastatin
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
## 2017-05-01 IMAGING — CR DG CHEST 1V PORT
1 series · 1 of 1 positions shown · non-contrast
Comparison: 08/05/2004

CLINICAL DATA: Chest pain starting last night

EXAM:
PORTABLE CHEST 1 VIEW

[AP]
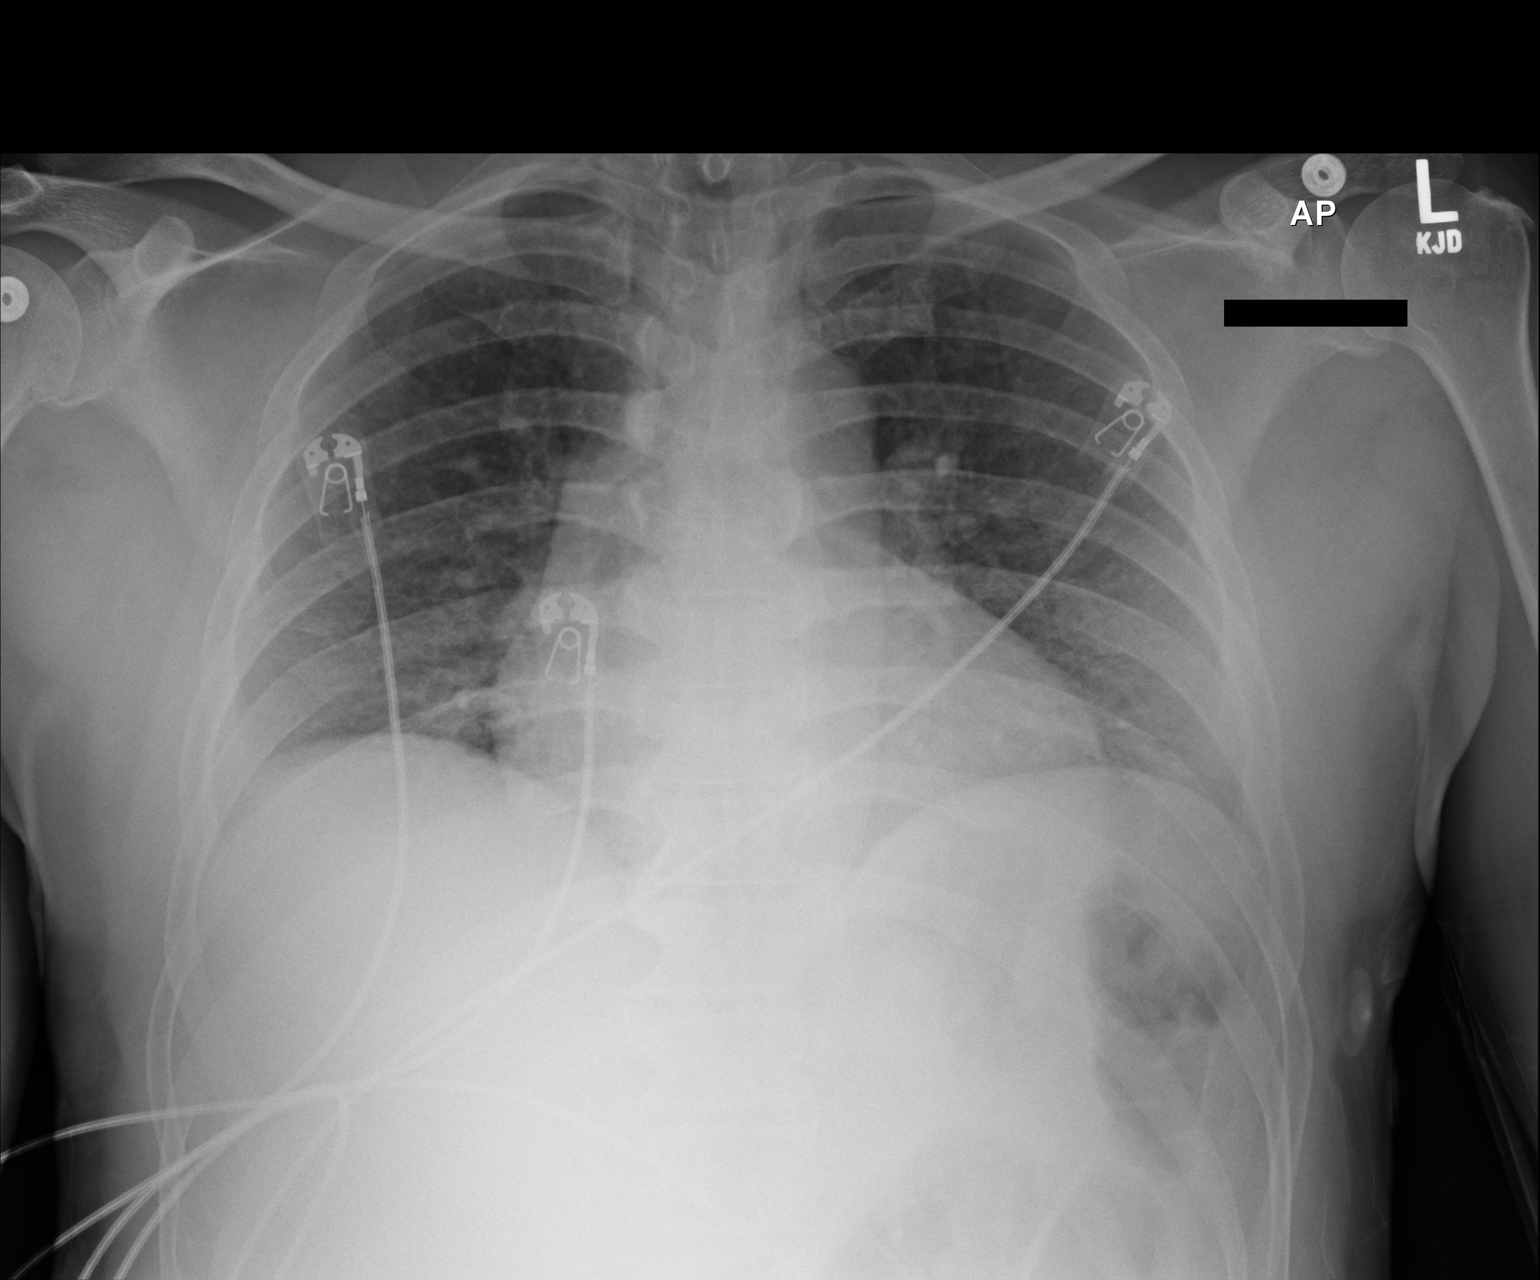

[1 of 1 positions shown; findings below may reference images not displayed]

FINDINGS: Cardiomediastinal silhouette is stable. Study is limited by poor
inspiration. No infiltrate or pleural effusion. No pulmonary edema.
Mild basilar atelectasis.
IMPRESSION: No infiltrate or pulmonary edema.  Mild basilar atelectasis.

## 2017-05-05 ENCOUNTER — Telehealth: Payer: Self-pay

## 2017-05-05 NOTE — Telephone Encounter (Signed)
Patient called asking for a letter from Dr. Amil Amen stating he is unable to work due to him needing hip surgery. States he needs a letter so he can get temporary food assistance from the county. To Dr. Amil Amen for further direction.

## 2017-05-06 NOTE — Telephone Encounter (Signed)
Spoke with patient. States it is going to [employer]. The address is [place], [place] [address].

## 2017-05-06 NOTE — Telephone Encounter (Signed)
Rxs were sent to pharmacy

## 2017-05-06 NOTE — Telephone Encounter (Signed)
I need to know who I am addressing this to.  Please check with him to get that information.  Need an address as well.

## 2017-05-18 ENCOUNTER — Encounter: Payer: Self-pay | Admitting: Cardiology

## 2017-06-02 NOTE — Progress Notes (Signed)
Cardiology Office Note   Date:  06/07/2017   ID:  Kenneth Long, DOB 11-30-1966, MRN [id number]  PCP:  Mack Hook, MD  Cardiologist:   Peter Martinique, MD   Chief Complaint  Patient presents with  . Coronary Artery Disease      History of Present Illness: Kenneth Long is a 51 y.o. male who presents for follow up CAD with STEMI. He was admitted in March 2017 with an inferior STEMI. He underwent cardiac cath which demonstrated occlusion of the second OM. Otherwise nonobstructive CAD. The OM was stented with a DES. EF by cath was low normal. Echo showed EF 40-45% without regional WMA.  He also has a history of HTN, asthma, and tobacco abuse. When last seen in March 2018 he was being considered for hip surgery. Brilinta was stopped one year post MI.   On follow up today he is doing very well. He hasn't smoked since his heart attack. He is eating healthy and lost weight. He is still limited by his hip problem. He was started on gemfibrozil for elevated triglycerides by Dr. Amil Amen. He does note some SOB but no chest pain, cough, or edema. He does have some HA.    Past Medical History:  Diagnosis Date  . Asthma   . Heart murmur   . Hypertension   . ST elevation (STEMI) myocardial infarction involving left circumflex coronary artery ([place]) 03/16/2016   DES CFX  . Umbilical hernia     Past Surgical History:  Procedure Laterality Date  . CARDIAC CATHETERIZATION N/A 03/16/2016   Procedure: Left Heart Cath and Coronary Angiography;  Surgeon: Peter M Martinique, MD;  LAD OK, CFX 100%, OM1 20%, RCA 20%, EF nl  . CARDIAC CATHETERIZATION N/A 03/16/2016   Procedure: Coronary Stent Intervention;  Surgeon: Peter M Martinique, MD; PROMUS PREM MR 3.0X16 mm DES   . UMBILICAL HERNIA REPAIR  5397   umbilical hernia repair     Current Outpatient Prescriptions  Medication Sig Dispense Refill  . albuterol (PROVENTIL HFA;VENTOLIN HFA) 108 (90 Base) MCG/ACT inhaler Inhale 2 puffs into  the lungs every 6 (six) hours as needed for wheezing or shortness of breath. 1 Inhaler 0  . amLODipine (NORVASC) 10 MG tablet Take 1 tablet (10 mg total) by mouth daily. 30 tablet 11  . aspirin 81 MG chewable tablet Chew 1 tablet (81 mg total) by mouth daily.    . fexofenadine (ALLEGRA) 180 MG tablet Take 1 tablet (180 mg total) by mouth daily.    [name] gemfibrozil (LOPID) 600 MG tablet Take 1 tablet (600 mg total) by mouth 2 (two) times daily before a meal. 60 tablet 11  . lisinopril-hydrochlorothiazide (PRINZIDE,ZESTORETIC) 20-25 MG tablet Take 1 tablet by mouth daily. 30 tablet 6  . metoprolol (LOPRESSOR) 50 MG tablet Take 1 tablet (50 mg total) by mouth 2 (two) times daily. 60 tablet 11  . mometasone (NASONEX) 50 MCG/ACT nasal spray 2 sprays each nostril daily 17 g 12  . nitroGLYCERIN (NITROSTAT) 0.4 MG SL tablet Place 1 tablet (0.4 mg total) under the tongue every 5 (five) minutes as needed for chest pain. 25 tablet 12  . potassium chloride SA (K-DUR,KLOR-CON) 20 MEQ tablet Take 2 tablets ( 40 meq) daily for 2 days then 1 tablet ( 20 meq ) daily 34 tablet 6   No current facility-administered medications for this visit.     Allergies:   Peanut-containing drug products and Shellfish allergy    Social History:  The  patient  reports that he quit smoking about 14 months ago. His smoking use included Cigarettes. He has never used smokeless tobacco. He reports that he drinks alcohol. He reports that he does not use drugs.   Family History:  The patient's family history includes Hypertension in his father, mother, sister, and sister; Sarcoidosis in his mother.    ROS:  Please see the history of present illness.   Otherwise, review of systems are positive for none.   All other systems are reviewed and negative.    PHYSICAL EXAM: VS:  BP 127/83 (BP Location: Right Arm)   Pulse 74   Ht 5\' 9"  (1.753 m)   Wt 161 lb 6.4 oz (73.2 kg)   BMI 23.83 kg/m  , BMI Body mass index is 23.83 kg/m. GEN: Well  nourished, well developed, in no acute distress  HEENT: normal  Neck: no JVD, carotid bruits, or masses Cardiac: RRR; no murmurs, rubs, or gallops,no edema  Respiratory:  clear to auscultation bilaterally, normal work of breathing GI: soft, nontender, nondistended, + BS MS: no deformity or atrophy  Skin: warm and dry, no rash Neuro:  Strength and sensation are intact Psych: euthymic mood, full affect   EKG:  EKG is ordered today. The ekg ordered today demonstrates NSR with normal Ecg. Rate 74. I have personally reviewed and interpreted this study.    Recent Labs: 07/22/2016: Hemoglobin 11.7; Platelets 368 03/24/2017: ALT 23 04/01/2017: BUN 19; Creatinine, Ser 1.16; Potassium 3.9; Sodium 138    Lipid Panel    Component Value Date/Time   CHOL 148 03/24/2017 0949   TRIG 534 (H) 03/24/2017 0949   HDL 35 (L) 03/24/2017 0949   CHOLHDL 6.5 03/17/2016 0407   VLDL 76 (H) 03/17/2016 0407   LDLCALC Comment 03/24/2017 0949      Wt Readings from Last 3 Encounters:  06/07/17 161 lb 6.4 oz (73.2 kg)  04/25/17 161 lb (73 kg)  03/03/17 167 lb 9.6 oz (76 kg)      Other studies Reviewed: Echo: 03/17/16: Study Conclusions  - Left ventricle: Posterior and lateral wall hypokinesis The cavity   size was moderately dilated. Systolic function was mildly to   moderately reduced. The estimated ejection fraction was in the   range of 40% to 45%. Wall motion was normal; there were no   regional wall motion abnormalities. - Mitral valve: There was mild regurgitation. - Left atrium: The atrium was mildly dilated. - Atrial septum: No defect or patent foramen ovale was identified.   Cardiac cath 03/16/16: Conclusion    Prox RCA to Mid RCA lesion, 20% stenosed.  1st Mrg lesion, 20% stenosed.  The left ventricular systolic function is normal.  2nd Mrg lesion, 100% stenosed. Post intervention, there is a 0% residual stenosis.   1. Single vessel occlusive CAD 2. Low normal LV function 3.  Successful stenting of the second OM with a DES  Plan: DAPT for one year. Patient may be a candidate for fast track discharge if no complications.       ASSESSMENT AND PLAN:  1. CAD s/p STEMI in March 2017 with DES of OM. Clinically doing very well. Off Brilinta now. Continue ASA, statin, beta blocker. He is cleared from my standpoint for hip surgery now. Follow up in 6 months.  2. Hyperlipidemia- mixed. On high dose statin and Lopid.   3. HTN well controlled.    Current medicines are reviewed at length with the patient today.  The patient does not have concerns  regarding medicines.  The following changes have been made:  no change  Labs/ tests ordered today include: none  Orders Placed This Encounter  Procedures  . EKG 12-Lead     Disposition:   FU with me in 6 months  Signed, Peter Martinique, MD  06/07/2017 10:46 AM    [facility] [address] [place], [place], [address] Phone [phone], Fax [phone]

## 2017-06-07 ENCOUNTER — Encounter: Payer: Self-pay | Admitting: Internal Medicine

## 2017-06-07 ENCOUNTER — Encounter: Payer: Self-pay | Admitting: Cardiology

## 2017-06-07 ENCOUNTER — Other Ambulatory Visit (INDEPENDENT_AMBULATORY_CARE_PROVIDER_SITE_OTHER): Payer: Self-pay

## 2017-06-07 ENCOUNTER — Ambulatory Visit (INDEPENDENT_AMBULATORY_CARE_PROVIDER_SITE_OTHER): Payer: No Typology Code available for payment source | Admitting: Cardiology

## 2017-06-07 VITALS — BP 127/83 | HR 74 | Ht 69.0 in | Wt 161.4 lb

## 2017-06-07 DIAGNOSIS — I2510 Atherosclerotic heart disease of native coronary artery without angina pectoris: Secondary | ICD-10-CM

## 2017-06-07 DIAGNOSIS — Z79899 Other long term (current) drug therapy: Secondary | ICD-10-CM

## 2017-06-07 DIAGNOSIS — I10 Essential (primary) hypertension: Secondary | ICD-10-CM

## 2017-06-07 DIAGNOSIS — E785 Hyperlipidemia, unspecified: Secondary | ICD-10-CM

## 2017-06-07 NOTE — Patient Instructions (Signed)
Continue your current therapy  You are clear to have hip surgery now.   I will see you in 6 months.

## 2017-06-07 NOTE — Telephone Encounter (Signed)
Letter written and given to patient

## 2017-06-08 ENCOUNTER — Other Ambulatory Visit: Payer: Self-pay

## 2017-06-08 DIAGNOSIS — E785 Hyperlipidemia, unspecified: Secondary | ICD-10-CM

## 2017-06-08 LAB — LIPID PANEL W/O CHOL/HDL RATIO
CHOLESTEROL TOTAL: 150 mg/dL (ref 100–199)
HDL: 32 mg/dL — ABNORMAL LOW (ref >39)
Triglycerides: 581 mg/dL (ref 0–149)

## 2017-06-08 LAB — HEPATIC FUNCTION PANEL
ALK PHOS: 74 IU/L (ref 39–117)
ALT: 12 IU/L (ref 0–44)
AST: 20 IU/L (ref 0–40)
Albumin: 4.8 g/dL (ref 3.5–5.5)
BILIRUBIN, DIRECT: 0.11 mg/dL (ref 0.00–0.40)
Bilirubin Total: 0.4 mg/dL (ref 0.0–1.2)
Total Protein: 7.7 g/dL (ref 6.0–8.5)

## 2017-06-08 MED ORDER — OMEGA-3-ACID ETHYL ESTERS 1 G PO CAPS: ORAL_CAPSULE | ORAL | 6 refills | Status: DC

## 2017-06-08 MED ORDER — ATORVASTATIN CALCIUM 80 MG PO TABS: 80.0000 mg | ORAL_TABLET | Freq: Every day | ORAL | 6 refills | Status: DC

## 2017-06-10 ENCOUNTER — Ambulatory Visit: Payer: Self-pay | Admitting: Internal Medicine

## 2017-06-10 ENCOUNTER — Encounter: Payer: Self-pay | Admitting: Internal Medicine

## 2017-06-10 DIAGNOSIS — E785 Hyperlipidemia, unspecified: Secondary | ICD-10-CM

## 2017-06-10 NOTE — Progress Notes (Signed)
Patient here, but already had meds changed by Dr. Martinique based on results of cholesterol, so wondering if does not need to be seen.  He does need an updated med list with the Lovaza and Atorvastatin.   He was not on Atorvastatin when this last cholesterol panel performed as he ran out of orange card in May and was unable to get the Atorvastatin, hence the switch to Lopid and recheck of cholesterol. Appt. Cancelled and scheduled repeat FLP, hepatic profile in 2 months.

## 2017-06-23 ENCOUNTER — Ambulatory Visit (INDEPENDENT_AMBULATORY_CARE_PROVIDER_SITE_OTHER): Payer: No Typology Code available for payment source

## 2017-06-23 ENCOUNTER — Ambulatory Visit (INDEPENDENT_AMBULATORY_CARE_PROVIDER_SITE_OTHER): Payer: No Typology Code available for payment source | Admitting: Sports Medicine

## 2017-06-23 ENCOUNTER — Encounter: Payer: Self-pay | Admitting: Sports Medicine

## 2017-06-23 VITALS — BP 110/82 | HR 76 | Ht 69.0 in | Wt 162.2 lb

## 2017-06-23 DIAGNOSIS — M25551 Pain in right hip: Secondary | ICD-10-CM

## 2017-06-23 DIAGNOSIS — I10 Essential (primary) hypertension: Secondary | ICD-10-CM

## 2017-06-23 DIAGNOSIS — I2510 Atherosclerotic heart disease of native coronary artery without angina pectoris: Secondary | ICD-10-CM

## 2017-06-23 DIAGNOSIS — M1610 Unilateral primary osteoarthritis, unspecified hip: Secondary | ICD-10-CM

## 2017-06-23 MED ORDER — TRAMADOL HCL 50 MG PO TABS: 50.0000 mg | ORAL_TABLET | Freq: Four times a day (QID) | ORAL | 0 refills | Status: DC | PRN

## 2017-06-23 NOTE — Progress Notes (Signed)
OFFICE VISIT NOTE Juanda Bond. Arlyne Brandes, [place] [facility] [place] - 51 y.o. male MRN [id number]  Date of birth: December 16, 1966  Visit Date: 06/23/2017  PCP: Mack Hook, MD   Referred by: Mack Hook, MD  Burlene Arnt, CMA acting as scribe for Dr. Paulla Fore.  SUBJECTIVE:   Chief Complaint  Patient presents with  . Follow-up    right hip pain   HPI: As below and per problem based documentation when appropriate.  Pt presents today with complaint of hip pain. She was a prior patient at [facility]. Pt points to groin/pelvic area when asked where the pain is.  Last Xray was done 03/12/17 and showed the following: IMPRESSION: 1. Mild osteoarthritis of the right hip. 2. 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 3. No hip fracture, dislocation or avascular necrosis.  This has been a chronic issue for [employer]. He reports that he was going to have an operation on the hip but then he had a heart attack and just got cleared to have surgery. He needs a new referral to orthopedic surgery, he thinks it was to a [facility].  No known injury or trauma.   The pain is described as constant shooting pain and is rated as 7/10.  Worsened with walking, going up and down stairs, worse going up. He has some pain when bending.  Pain improves when sitting.  Therapies tried include : He is not currently taken any medications for the pain. He has taken Tramadol and Aleve for the pain in the past. He tried ice and heat in the past with no relief, it seems to aggravate it more.   Other associated symptoms include: Pt denies back pain  Pt denies fever and chills but does have night sweats.     Review of Systems  Constitutional: Negative for chills and fever.  Respiratory: Positive for shortness of breath and wheezing.   Cardiovascular: Positive for leg swelling. Negative for chest pain and  palpitations.  Musculoskeletal: Negative for falls.  Neurological: Positive for tingling (toes) and headaches. Negative for dizziness.  Endo/Heme/Allergies: Does not bruise/bleed easily.    Otherwise per HPI.  HISTORY & PERTINENT PRIOR DATA:  No specialty comments available. He reports that he quit smoking about 16 months ago. His smoking use included Cigarettes. He has never used smokeless tobacco. No results for input(s): HGBA1C, LABURIC in the last 8760 hours. Medications & Allergies reviewed per EMR Patient Active Problem List   Diagnosis Date Noted  . Primary osteoarthritis of right hip 07/11/2017  . Coronary artery disease 04/25/2017  . Seasonal allergies 04/25/2017  . Hyperlipidemia LDL goal <70 03/25/2016  . Cardiomyopathy, ischemic 03/25/2016  . ST elevation (STEMI) myocardial infarction involving left circumflex coronary artery ([place]) 03/16/2016  . Essential hypertension 03/09/2016  . Decreased visual acuity 03/09/2016  . Dental decay 03/09/2016  . Hip arthritis 03/09/2016   Past Medical History:  Diagnosis Date  . Asthma   . Heart murmur   . Hypertension   . ST elevation (STEMI) myocardial infarction involving left circumflex coronary artery ([place]) 03/16/2016   DES CFX  . Umbilical hernia    Family History  Problem Relation Age of Onset  . Hypertension Mother   . Sarcoidosis Mother   . Hypertension Father   . Hypertension Sister   . Hypertension Sister    Past Surgical History:  Procedure Laterality Date  . CARDIAC CATHETERIZATION N/A 03/16/2016  Procedure: Left Heart Cath and Coronary Angiography;  Surgeon: Peter M Martinique, MD;  LAD OK, CFX 100%, OM1 20%, RCA 20%, EF nl  . CARDIAC CATHETERIZATION N/A 03/16/2016   Procedure: Coronary Stent Intervention;  Surgeon: Peter M Martinique, MD; PROMUS PREM MR 3.0X16 mm DES   . UMBILICAL HERNIA REPAIR  8115   umbilical hernia repair   Social History   Occupational History  . 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 Smoking status: Former Smoker    Types: Cigarettes    Quit date: 03/18/2016  . Smokeless tobacco: Never Used  . Alcohol use 0.0 oz/week     Comment: 2 beers every few days  . Drug use: No  . Sexual activity: Not on file    OBJECTIVE:  VS:  HT:5\' 9"  (175.3 cm)   WT:162 lb 3.2 oz (73.6 kg)  BMI:24    BP:110/82  HR:76bpm  TEMP: ( )  RESP:96 % EXAM: Findings:  Markedly limited range of motion of the right hip.  He has marked pain with logroll as well as with Stinchfield testing.  Hip abduction strength is intact although painful.  Marked pain with axial loading and circumduction of the hip.     Dg Hip Unilat W Or W/o Pelvis 2-3 Views Right  Result Date: 06/23/2017 CLINICAL DATA:  Right hip pain, no known injury, initial encounter EXAM: DG HIP (WITH OR WITHOUT PELVIS) 2-3V RIGHT COMPARISON:  12/05/2015 FINDINGS: Pelvic ring is intact. Degenerative changes of the hip joints are noted bilaterally. No acute fracture or dislocation is seen. No soft tissue abnormality is noted. IMPRESSION: No acute abnormality seen. Electronically Signed   By: Inez Catalina M.D.   On: 06/23/2017 12:52   ASSESSMENT & PLAN:     ICD-10-CM   1. Right hip pain M25.551 DG HIP UNILAT W OR W/O PELVIS 2-3 VIEWS RIGHT    Ambulatory referral to Orthopedic Surgery  2. Coronary artery disease involving native coronary artery of native heart without angina pectoris I25.10   3. Hip arthritis M16.10   4. Essential hypertension I10   ================================================================= Hip arthritis Patient has fairly significant degenerative changes that it actually worsened on x-rays today.  He does have a labral tear given his other medical conditions is not a candidate for a labral repair.  He has previously had only mild relief with intra-articular injection.   total hip arthroplasty indicated at this time.  Referral placed to [facility].  Patient has just completed a year of treatment with anticoagulation for coronary artery disease with stenting.  He reports being cleared from his cardiologist to proceed with any type of intervention indicated at this time.  This will need to be addressed at the time of surgery. ================================================================= There are no Patient Instructions on file for this visit.=================================================================  Follow-up: Return if symptoms worsen or fail to improve.   CMA/ATC served as Education administrator during this visit. History, Physical, and Plan performed by medical provider. Documentation and orders reviewed and attested to.      Teresa Coombs, [facility] Physician

## 2017-07-05 ENCOUNTER — Ambulatory Visit (INDEPENDENT_AMBULATORY_CARE_PROVIDER_SITE_OTHER): Payer: Self-pay | Admitting: Orthopaedic Surgery

## 2017-07-11 ENCOUNTER — Ambulatory Visit (INDEPENDENT_AMBULATORY_CARE_PROVIDER_SITE_OTHER): Payer: Self-pay | Admitting: Orthopaedic Surgery

## 2017-07-11 ENCOUNTER — Encounter (INDEPENDENT_AMBULATORY_CARE_PROVIDER_SITE_OTHER): Payer: Self-pay | Admitting: Orthopaedic Surgery

## 2017-07-11 DIAGNOSIS — M1611 Unilateral primary osteoarthritis, right hip: Secondary | ICD-10-CM | POA: Insufficient documentation

## 2017-07-11 NOTE — Progress Notes (Signed)
Office Visit Note   Patient: Kenneth Long           Date of Birth: Mar 01, 1966           MRN: [id number] Visit Date: 07/11/2017              Requested by: Gerda Diss, DO [place],  [id number] PCP: Mack Hook, MD   Assessment & Plan: Visit Diagnoses:  1. Primary osteoarthritis of right hip     Plan: Patient has degenerative joint disease of his right hip that has failed conservative treatment. At this point he would like to pursue total hip replacement. We will make sure from Dr. Doug Sou perspective that he can tolerate surgery. Questions encouraged and answered. We discussed the surgery in detail and the associated risks benefits and alternatives. We'll get him scheduled in the near future   Follow-Up Instructions: Return if symptoms worsen or fail to improve.   Orders:  No orders of the defined types were placed in this encounter.  No orders of the defined types were placed in this encounter.     Procedures: No procedures performed   Clinical Data: No additional findings.   Subjective: Chief Complaint  Patient presents with  . Right Hip - Pain    Patient is a 51 year old gentleman comes in with chronic right hip degenerative joint disease. He is had injection the past with 2-3 day relief. He has stopped anticoagulation for his cardiac catheterization and heart stent a year and half ago. He denies any injuries. He walks with a limp. Tramadol does not help significantly. He is severely limited with ADLs.    Review of Systems  Constitutional: Negative.   All other systems reviewed and are negative.    Objective: Vital Signs: There were no vitals taken for this visit.  Physical Exam  Constitutional: He is oriented to person, place, and time. He appears well-developed and well-nourished.  HENT:  Head: Normocephalic and atraumatic.  Eyes: Pupils are equal, round, and reactive to light.  Neck: Neck supple.    Pulmonary/Chest: Effort normal.  Abdominal: Soft.  Musculoskeletal: Normal range of motion.  Neurological: He is alert and oriented to person, place, and time.  Skin: Skin is warm.  Psychiatric: He has a normal mood and affect. His behavior is normal. Judgment and thought content normal.  Nursing note and vitals reviewed.   Ortho Exam Right hip exam shows pain with range of motion and positive Stinchfield sign. He does walk with a limp. Lateral hip is only mildly tender. Specialty Comments:  No specialty comments available.  Imaging: No results found.   PMFS History: Patient Active Problem List   Diagnosis Date Noted  . Primary osteoarthritis of right hip 07/11/2017  . Coronary artery disease 04/25/2017  . Seasonal allergies 04/25/2017  . Hyperlipidemia LDL goal <70 03/25/2016  . Cardiomyopathy, ischemic 03/25/2016  . ST elevation (STEMI) myocardial infarction involving left circumflex coronary artery ([place]) 03/16/2016  . Essential hypertension 03/09/2016  . Decreased visual acuity 03/09/2016  . Dental decay 03/09/2016  . Hip arthritis 03/09/2016   Past Medical History:  Diagnosis Date  . Asthma   . Heart murmur   . Hypertension   . ST elevation (STEMI) myocardial infarction involving left circumflex coronary artery ([place]) 03/16/2016   DES CFX  . Umbilical hernia     Family History  Problem Relation Age of Onset  . Hypertension Mother   . Sarcoidosis Mother   . Hypertension Father   .  Hypertension Sister   . Hypertension Sister     Past Surgical History:  Procedure Laterality Date  . CARDIAC CATHETERIZATION N/A 03/16/2016   Procedure: Left Heart Cath and Coronary Angiography;  Surgeon: Peter M Martinique, MD;  LAD OK, CFX 100%, OM1 20%, RCA 20%, EF nl  . CARDIAC CATHETERIZATION N/A 03/16/2016   Procedure: Coronary Stent Intervention;  Surgeon: Peter M Martinique, MD; PROMUS PREM MR 3.0X16 mm DES   . UMBILICAL HERNIA REPAIR  8032   umbilical hernia repair   Social  History   Occupational History  . 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 Smoking status: Former Smoker    Types: Cigarettes    Quit date: 03/18/2016  . Smokeless tobacco: Never Used  . Alcohol use 0.0 oz/week     Comment: 2 beers every few days  . Drug use: No  . Sexual activity: Not on file

## 2017-07-19 NOTE — Assessment & Plan Note (Signed)
Patient has fairly significant degenerative changes that it actually worsened on x-rays today.  He does have a labral tear given his other medical conditions is not a candidate for a labral repair.  He has previously had only mild relief with intra-articular injection.   total hip arthroplasty indicated at this time.  Referral placed to [place].  Patient has just completed a year of treatment with anticoagulation for coronary artery disease with stenting.  He reports being cleared from his cardiologist to proceed with any type of intervention indicated at this time.  This will need to be addressed at the time of surgery.

## 2017-07-27 ENCOUNTER — Telehealth: Payer: Self-pay | Admitting: Internal Medicine

## 2017-07-27 NOTE — Telephone Encounter (Signed)
Patient called has swelling in right legs, right leg is worse.  This has been going on for about two weeks.  Could it be the Rx he is taking omega-2 aid ethyl ester (LOVAZA) causing him a concern.  Please call patient at [phone].

## 2017-08-01 NOTE — Telephone Encounter (Signed)
Spoke with patient states he had some swelling in legs and ankles. States his legs have gone down but ankles are slightly swollen. States he feels like this can wait until his appointment on 08/09/17. Informed patient to call the office if he gets worse and I will try to work him in for office visit.

## 2017-08-09 ENCOUNTER — Other Ambulatory Visit (INDEPENDENT_AMBULATORY_CARE_PROVIDER_SITE_OTHER): Payer: Self-pay

## 2017-08-09 DIAGNOSIS — Z79899 Other long term (current) drug therapy: Secondary | ICD-10-CM

## 2017-08-09 DIAGNOSIS — E785 Hyperlipidemia, unspecified: Secondary | ICD-10-CM

## 2017-08-10 ENCOUNTER — Other Ambulatory Visit: Payer: Self-pay | Admitting: Cardiology

## 2017-08-10 LAB — HEPATIC FUNCTION PANEL
ALBUMIN: 4.8 g/dL (ref 3.5–5.5)
ALK PHOS: 84 IU/L (ref 39–117)
ALT: 48 IU/L — ABNORMAL HIGH (ref 0–44)
AST: 47 IU/L — ABNORMAL HIGH (ref 0–40)
Bilirubin Total: 0.2 mg/dL (ref 0.0–1.2)
Bilirubin, Direct: 0.11 mg/dL (ref 0.00–0.40)
TOTAL PROTEIN: 7.8 g/dL (ref 6.0–8.5)

## 2017-08-10 LAB — LIPID PANEL W/O CHOL/HDL RATIO
Cholesterol, Total: 141 mg/dL (ref 100–199)
HDL: 32 mg/dL — AB (ref >39)
TRIGLYCERIDES: 625 mg/dL — AB (ref 0–149)

## 2017-08-12 ENCOUNTER — Encounter: Payer: Self-pay | Admitting: Internal Medicine

## 2017-08-12 ENCOUNTER — Ambulatory Visit (INDEPENDENT_AMBULATORY_CARE_PROVIDER_SITE_OTHER): Payer: Self-pay | Admitting: Internal Medicine

## 2017-08-12 VITALS — BP 140/98 | HR 72 | Resp 12 | Ht 69.0 in | Wt 160.0 lb

## 2017-08-12 DIAGNOSIS — I2510 Atherosclerotic heart disease of native coronary artery without angina pectoris: Secondary | ICD-10-CM

## 2017-08-12 DIAGNOSIS — E785 Hyperlipidemia, unspecified: Secondary | ICD-10-CM

## 2017-08-12 DIAGNOSIS — I10 Essential (primary) hypertension: Secondary | ICD-10-CM

## 2017-08-12 NOTE — Patient Instructions (Signed)
Call midweek next week if you have not heard from me

## 2017-08-12 NOTE — Progress Notes (Signed)
   Subjective:    Patient ID: Kenneth Long, male    DOB: Jun 23, 1966, 51 y.o.   MRN: [id number]  HPI   Follow up mainly for his cholesterol--not clear what he was doing with meds  1.  Hypertriglyceridemia and low HDL:  Patient states he has not been taking the 4 g of fish oil as prescribed June 20th.  Was only taking 1 cap of unknown strength fish oil until several weeks ago--was belching up fish taste, so stopped completely before blood work done.  Began taking 2 in the morning and 2 at night of a different formulation omega fatty acids this past week after cholesterol panel returned with triglycerides.  Tolerating this formulation a bit better, but still with fish taste frequently. Labs with no improvement of triglycerides or HDL. Lipid Panel     Component Value Date/Time   CHOL 141 08/09/2017 0853   TRIG 625 (HH) 08/09/2017 0853   HDL 32 (L) 08/09/2017 0853   CHOLHDL 6.5 03/17/2016 0407   VLDL 76 (H) 03/17/2016 0407   Rosa Sanchez Comment 08/09/2017 0853    2.  CAD:  No chest pain.  3.  Essential Hypertension:  States he was late in taking his bp meds this morning.  4.  Elevated liver enzymes:  Transaminases mildly up with hepatic profile checked with lipids.  Describes drinking max of 16 oz beer in days before bloodwork, but does not drink regularly.    Review of Systems     Objective:   Physical Exam NAD        Assessment & Plan:  1.  Dyslipidemia:  Long discussion of diet again today.  Call into [facility] and check with [facility]--the latter has Lovaza generic at 2 g bid at $55 per month and have not heard from [facility]. We did find the [facility] for [place] for Lovaza and called this website for patient to get started and bring in application beginning of next week. If he does not meet criteria for whatever reason, will check [facility] as well. Return in 6 weeks for repeat FLP and hepatic profile, will look into cost of measured LDL.  2. CAD:  Stable.  3.  Essential  Hypertension:  Up today.  To be consistent with taking meds.  4.  Elevated liver enzymes:  As in #1.  5.  Hip OA:  Plans for Total hip first week of September.

## 2017-08-15 NOTE — Pre-Procedure Instructions (Signed)
Kenneth Long  08/15/2017      [place], [place] [address] Phone: [phone] Fax: [phone]  [place], [place] - [address] [place] [address] Phone: [phone] Fax: [place], [place] [address] Phone: [phone] Fax: [phone]    Your procedure is scheduled on Wednesday September 5.  Report to [facility] Admitting at 10:45 A.M.  Call this number if you have problems the morning of surgery:  [phone]   Remember:  Do not eat food or drink liquids after midnight.  Take these medicines the morning of surgery with A SIP OF WATER: metoprolol (lopressor), amlodipine (norvasc), fexofenadine (Allegra), tramadol (ultram) if needed, albuterol if needed (please bring inhaler to hospital with you)  7 days prior to surgery STOP taking any Aspirin, Aleve, Naproxen, Ibuprofen, Motrin, Advil, Goody's, BC's, all herbal medications, fish oil, and all vitamins    Do not wear jewelry, make-up or nail polish.  Do not wear lotions, powders, or perfumes, or deoderant.  Do not shave 48 hours prior to surgery.  Men may shave face and neck.  Do not bring valuables to the hospital.  [facility] is not responsible for any belongings or valuables.  Contacts, dentures or bridgework may not be worn into surgery.  Leave your suitcase in the car.  After surgery it may be brought to your room.  For patients admitted to the hospital, discharge time will be determined by your treatment team.  Patients discharged the day of surgery will not be allowed to drive home.    Special instructions:    - Preparing For Surgery  Before surgery, you can play an important role. Because skin is not sterile, your skin needs to be as free of germs as  possible. You can reduce the number of germs on your skin by washing with CHG (chlorahexidine gluconate) Soap before surgery.  CHG is an antiseptic cleaner which kills germs and bonds with the skin to continue killing germs even after washing.  Please do not use if you have an allergy to CHG or antibacterial soaps. If your skin becomes reddened/irritated stop using the CHG.  Do not shave (including legs and underarms) for at least 48 hours prior to first CHG shower. It is OK to shave your face.  Please follow these instructions carefully.   1. Shower the NIGHT BEFORE SURGERY and the MORNING OF SURGERY with CHG.   2. If you chose to wash your hair, wash your hair first as usual with your normal shampoo.  3. After you shampoo, rinse your hair and body thoroughly to remove the shampoo.  4. Use CHG as you would any other liquid soap. You can apply CHG directly to the skin and wash gently with a scrungie or a clean washcloth.   5. Apply the CHG Soap to your body ONLY FROM THE NECK DOWN.  Do not use on open wounds or open sores. Avoid contact with your eyes, ears, mouth and genitals (private parts). Wash genitals (private parts) with your normal soap.  6. Wash thoroughly, paying special attention to the area where your surgery will be performed.  7. Thoroughly rinse your body with warm water from the neck down.  8. DO NOT shower/wash with your normal soap after using and  rinsing off the CHG Soap.  9. Pat yourself dry with a CLEAN TOWEL.   10. Wear CLEAN PAJAMAS   11. Place CLEAN SHEETS on your bed the night of your first shower and DO NOT SLEEP WITH PETS.    Day of Surgery: Do not apply any deodorants/lotions. Please wear clean clothes to the hospital/surgery center.      Please read over the following fact sheets that you were given. Total Joint Packet and MRSA Information

## 2017-08-16 ENCOUNTER — Encounter (HOSPITAL_COMMUNITY): Payer: Self-pay

## 2017-08-16 ENCOUNTER — Other Ambulatory Visit (INDEPENDENT_AMBULATORY_CARE_PROVIDER_SITE_OTHER): Payer: Self-pay | Admitting: Orthopaedic Surgery

## 2017-08-16 ENCOUNTER — Encounter (HOSPITAL_COMMUNITY)
Admission: RE | Admit: 2017-08-16 | Discharge: 2017-08-16 | Disposition: A | Discharge status: Home / Self Care | Payer: No Typology Code available for payment source | Source: Ambulatory Visit | Attending: Orthopaedic Surgery | Admitting: Orthopaedic Surgery

## 2017-08-16 DIAGNOSIS — Z01818 Encounter for other preprocedural examination: Secondary | ICD-10-CM | POA: Insufficient documentation

## 2017-08-16 DIAGNOSIS — I10 Essential (primary) hypertension: Secondary | ICD-10-CM | POA: Insufficient documentation

## 2017-08-16 DIAGNOSIS — Z7982 Long term (current) use of aspirin: Secondary | ICD-10-CM | POA: Insufficient documentation

## 2017-08-16 DIAGNOSIS — M1611 Unilateral primary osteoarthritis, right hip: Secondary | ICD-10-CM | POA: Insufficient documentation

## 2017-08-16 DIAGNOSIS — J45909 Unspecified asthma, uncomplicated: Secondary | ICD-10-CM | POA: Insufficient documentation

## 2017-08-16 DIAGNOSIS — G4733 Obstructive sleep apnea (adult) (pediatric): Secondary | ICD-10-CM | POA: Insufficient documentation

## 2017-08-16 DIAGNOSIS — Z87891 Personal history of nicotine dependence: Secondary | ICD-10-CM | POA: Insufficient documentation

## 2017-08-16 DIAGNOSIS — I252 Old myocardial infarction: Secondary | ICD-10-CM | POA: Insufficient documentation

## 2017-08-16 DIAGNOSIS — Z955 Presence of coronary angioplasty implant and graft: Secondary | ICD-10-CM | POA: Insufficient documentation

## 2017-08-16 DIAGNOSIS — Z01812 Encounter for preprocedural laboratory examination: Secondary | ICD-10-CM | POA: Insufficient documentation

## 2017-08-16 DIAGNOSIS — Z79899 Other long term (current) drug therapy: Secondary | ICD-10-CM | POA: Insufficient documentation

## 2017-08-16 DIAGNOSIS — I2510 Atherosclerotic heart disease of native coronary artery without angina pectoris: Secondary | ICD-10-CM | POA: Insufficient documentation

## 2017-08-16 HISTORY — DX: Headache, unspecified: R51.9

## 2017-08-16 HISTORY — DX: Headache: R51

## 2017-08-16 HISTORY — DX: Dyspnea, unspecified: R06.00

## 2017-08-16 HISTORY — DX: Atherosclerotic heart disease of native coronary artery without angina pectoris: I25.10

## 2017-08-16 HISTORY — DX: Sleep apnea, unspecified: G47.30

## 2017-08-16 HISTORY — DX: Unspecified osteoarthritis, unspecified site: M19.90

## 2017-08-16 LAB — COMPREHENSIVE METABOLIC PANEL
ALBUMIN: 4.2 g/dL (ref 3.5–5.0)
ALK PHOS: 68 U/L (ref 38–126)
ALT: 42 U/L (ref 17–63)
AST: 42 U/L — AB (ref 15–41)
Anion gap: 11 (ref 5–15)
BUN: 12 mg/dL (ref 6–20)
CALCIUM: 9.7 mg/dL (ref 8.9–10.3)
CO2: 25 mmol/L (ref 22–32)
CREATININE: 1.23 mg/dL (ref 0.61–1.24)
Chloride: 105 mmol/L (ref 101–111)
GFR calc Af Amer: >60 mL/min (ref >60)
GFR calc non Af Amer: >60 mL/min (ref >60)
GLUCOSE: 104 mg/dL — AB (ref 65–99)
Potassium: 3 mmol/L — ABNORMAL LOW (ref 3.5–5.1)
SODIUM: 141 mmol/L (ref 135–145)
Total Bilirubin: 0.7 mg/dL (ref 0.3–1.2)
Total Protein: 7.9 g/dL (ref 6.5–8.1)

## 2017-08-16 LAB — CBC
HCT: 35.1 % — ABNORMAL LOW (ref 39.0–52.0)
HEMOGLOBIN: 11.8 g/dL — AB (ref 13.0–17.0)
MCH: 30.2 pg (ref 26.0–34.0)
MCHC: 33.6 g/dL (ref 30.0–36.0)
MCV: 89.8 fL (ref 78.0–100.0)
Platelets: 337 10*3/uL (ref 150–400)
RBC: 3.91 MIL/uL — ABNORMAL LOW (ref 4.22–5.81)
RDW: 13.6 % (ref 11.5–15.5)
WBC: 11.2 10*3/uL — ABNORMAL HIGH (ref 4.0–10.5)

## 2017-08-16 LAB — SURGICAL PCR SCREEN
MRSA, PCR: NEGATIVE
Staphylococcus aureus: POSITIVE — AB

## 2017-08-16 NOTE — Progress Notes (Signed)
PCR positive for MSSA, negative for MRSA. Patient notified and prescription called to [place].

## 2017-08-16 NOTE — Progress Notes (Signed)
PCP - Mack Hook Cardiologist - Peter Martinique  EKG - 06/07/17 ECHO - 03/17/16 Cardiac Cath - 03/16/16  Left message at Dr. Phoebe Sharps office to notify patient whether to stop Aspirin. Patient was cleared by Dr. Martinique for surgery.   Patient denies shortness of breath, fever, cough and chest pain at PAT appointment  Patient verbalized understanding of instructions that were given to them at the PAT appointment. Patient was also instructed that they will need to review over the PAT instructions again at home before surgery.

## 2017-08-17 NOTE — Progress Notes (Signed)
Anesthesia Chart Review:  Pt is a 51 year old male scheduled for R total hip arthroplasty anterior approach on 08/24/2017 with Frankey Shown, MD  - PCP is Mack Hook, MD - Cardiologist is Peter Martinique, MD who cleared pt for surgery at last office visit 06/07/17  PMH includes:  CAD (STEMI, DES to Tony 03/16/16), HTN, OSA, asthma. Former smoker (quit 03/18/16). BMI 24  Medications include: Albuterol, amlodipine, ASA 81 mg, Lipitor, lisinopril-HCTZ, metoprolol, lovaza, potassium  Preoperative labs reviewed.    EKG 06/07/17: NSR  Echo 03/17/16:  - Left ventricle: Posterior and lateral wall hypokinesis The cavity size was moderately dilated. Systolic function was mildly to moderately reduced. The estimated ejection fraction was in the range of 40% to 45%. Wall motion was normal; there were no regional wall motion abnormalities. - Mitral valve: There was mild regurgitation. - Left atrium: The atrium was mildly dilated. - Atrial septum: No defect or patent foramen ovale was identified.  Cardiac cath 03/16/16:   Prox RCA to Mid RCA lesion, 20% stenosed.  1st Mrg lesion, 20% stenosed.  The left ventricular systolic function is normal.  2nd Mrg lesion, 100% stenosed. Post intervention, there is a 0% residual stenosis. 1. Single vessel occlusive CAD 2. Low normal LV function 3. Successful stenting of the second OM with a DES  If no changes, I anticipate pt can proceed with surgery as scheduled.   Willeen Cass, FNP-BC [facility] Short Stay Surgical Center/Anesthesiology Phone: [phone] 08/17/2017 10:50 AM

## 2017-08-23 MED ORDER — TRANEXAMIC ACID 1000 MG/10ML IV SOLN
1000.0000 mg | INTRAVENOUS | Status: AC
  Administered 2017-08-24: 1000 mg via INTRAVENOUS
  Filled 2017-08-23: qty 1100

## 2017-08-24 ENCOUNTER — Encounter (HOSPITAL_COMMUNITY): Payer: Self-pay | Admitting: Certified Registered"

## 2017-08-24 ENCOUNTER — Inpatient Hospital Stay (HOSPITAL_COMMUNITY): Payer: Self-pay

## 2017-08-24 ENCOUNTER — Inpatient Hospital Stay (HOSPITAL_COMMUNITY): Payer: Self-pay | Admitting: Anesthesiology

## 2017-08-24 ENCOUNTER — Inpatient Hospital Stay (HOSPITAL_COMMUNITY): Payer: Self-pay | Admitting: Emergency Medicine

## 2017-08-24 ENCOUNTER — Encounter (HOSPITAL_COMMUNITY)
Admission: RE | Disposition: A | Discharge status: Home Health | Payer: Self-pay | Source: Ambulatory Visit | Attending: Orthopaedic Surgery

## 2017-08-24 ENCOUNTER — Inpatient Hospital Stay (HOSPITAL_COMMUNITY)
Admission: RE | Admit: 2017-08-24 | Discharge: 2017-08-25 | DRG: 470 | Disposition: A | Discharge status: Home Health | Payer: Self-pay | Source: Ambulatory Visit | Attending: Orthopaedic Surgery | Admitting: Orthopaedic Surgery

## 2017-08-24 DIAGNOSIS — M1611 Unilateral primary osteoarthritis, right hip: Secondary | ICD-10-CM

## 2017-08-24 DIAGNOSIS — Z87891 Personal history of nicotine dependence: Secondary | ICD-10-CM

## 2017-08-24 DIAGNOSIS — Z96649 Presence of unspecified artificial hip joint: Secondary | ICD-10-CM

## 2017-08-24 DIAGNOSIS — J45909 Unspecified asthma, uncomplicated: Secondary | ICD-10-CM | POA: Diagnosis present

## 2017-08-24 DIAGNOSIS — Z955 Presence of coronary angioplasty implant and graft: Secondary | ICD-10-CM

## 2017-08-24 DIAGNOSIS — G4730 Sleep apnea, unspecified: Secondary | ICD-10-CM | POA: Diagnosis present

## 2017-08-24 DIAGNOSIS — I2510 Atherosclerotic heart disease of native coronary artery without angina pectoris: Secondary | ICD-10-CM | POA: Diagnosis present

## 2017-08-24 DIAGNOSIS — Z79899 Other long term (current) drug therapy: Secondary | ICD-10-CM

## 2017-08-24 DIAGNOSIS — Z419 Encounter for procedure for purposes other than remedying health state, unspecified: Secondary | ICD-10-CM

## 2017-08-24 DIAGNOSIS — Z7982 Long term (current) use of aspirin: Secondary | ICD-10-CM

## 2017-08-24 DIAGNOSIS — I252 Old myocardial infarction: Secondary | ICD-10-CM

## 2017-08-24 DIAGNOSIS — I10 Essential (primary) hypertension: Secondary | ICD-10-CM | POA: Diagnosis present

## 2017-08-24 DIAGNOSIS — D62 Acute posthemorrhagic anemia: Secondary | ICD-10-CM | POA: Diagnosis not present

## 2017-08-24 HISTORY — PX: TOTAL HIP ARTHROPLASTY: SHX124

## 2017-08-24 LAB — COMPREHENSIVE METABOLIC PANEL
ALK PHOS: 66 U/L (ref 38–126)
ALT: 69 U/L — AB (ref 17–63)
AST: 48 U/L — ABNORMAL HIGH (ref 15–41)
Albumin: 4.1 g/dL (ref 3.5–5.0)
Anion gap: 10 (ref 5–15)
BUN: 13 mg/dL (ref 6–20)
CALCIUM: 9.7 mg/dL (ref 8.9–10.3)
CO2: 22 mmol/L (ref 22–32)
CREATININE: 0.92 mg/dL (ref 0.61–1.24)
Chloride: 106 mmol/L (ref 101–111)
Glucose, Bld: 107 mg/dL — ABNORMAL HIGH (ref 65–99)
Potassium: 3 mmol/L — ABNORMAL LOW (ref 3.5–5.1)
SODIUM: 138 mmol/L (ref 135–145)
TOTAL PROTEIN: 7.2 g/dL (ref 6.5–8.1)
Total Bilirubin: 0.6 mg/dL (ref 0.3–1.2)

## 2017-08-24 LAB — URINALYSIS, ROUTINE W REFLEX MICROSCOPIC
BILIRUBIN URINE: NEGATIVE
GLUCOSE, UA: NEGATIVE mg/dL
Ketones, ur: NEGATIVE mg/dL
Nitrite: NEGATIVE
PH: 6 (ref 5.0–8.0)
Protein, ur: NEGATIVE mg/dL
SPECIFIC GRAVITY, URINE: 1.02 (ref 1.005–1.030)

## 2017-08-24 LAB — SEDIMENTATION RATE: Sed Rate: 27 mm/hr — ABNORMAL HIGH (ref 0–16)

## 2017-08-24 LAB — CBC WITH DIFFERENTIAL/PLATELET
Basophils Absolute: 0 10*3/uL (ref 0.0–0.1)
Basophils Relative: 0 %
EOS ABS: 0.3 10*3/uL (ref 0.0–0.7)
EOS PCT: 3 %
HCT: 33.6 % — ABNORMAL LOW (ref 39.0–52.0)
HEMOGLOBIN: 11.2 g/dL — AB (ref 13.0–17.0)
LYMPHS ABS: 2.5 10*3/uL (ref 0.7–4.0)
LYMPHS PCT: 25 %
MCH: 30.4 pg (ref 26.0–34.0)
MCHC: 33.3 g/dL (ref 30.0–36.0)
MCV: 91.1 fL (ref 78.0–100.0)
MONOS PCT: 5 %
Monocytes Absolute: 0.5 10*3/uL (ref 0.1–1.0)
Neutro Abs: 6.7 10*3/uL (ref 1.7–7.7)
Neutrophils Relative %: 67 %
Platelets: 320 10*3/uL (ref 150–400)
RBC: 3.69 MIL/uL — AB (ref 4.22–5.81)
RDW: 13.4 % (ref 11.5–15.5)
WBC: 10 10*3/uL (ref 4.0–10.5)

## 2017-08-24 LAB — APTT: aPTT: 33 seconds (ref 24–36)

## 2017-08-24 LAB — URINALYSIS, MICROSCOPIC (REFLEX)

## 2017-08-24 LAB — TYPE AND SCREEN
ABO/RH(D): O POS
Antibody Screen: NEGATIVE

## 2017-08-24 LAB — C-REACTIVE PROTEIN: CRP: 2.2 mg/dL — AB (ref <1.0)

## 2017-08-24 LAB — ABO/RH: ABO/RH(D): O POS

## 2017-08-24 LAB — PROTIME-INR
INR: 0.89
Prothrombin Time: 12 seconds (ref 11.4–15.2)

## 2017-08-24 SURGERY — ARTHROPLASTY, HIP, TOTAL, ANTERIOR APPROACH
Anesthesia: Spinal | Site: Hip | Laterality: Right

## 2017-08-24 MED ORDER — METHOCARBAMOL 500 MG PO TABS
ORAL_TABLET | ORAL | Status: AC
  Administered 2017-08-24: 500 mg via ORAL
  Filled 2017-08-24: qty 1

## 2017-08-24 MED ORDER — OMEGA-3-ACID ETHYL ESTERS 1 G PO CAPS
1.0000 g | ORAL_CAPSULE | Freq: Every day | ORAL | Status: DC
  Administered 2017-08-24 – 2017-08-25 (×2): 1 g via ORAL
  Filled 2017-08-24 (×2): qty 1

## 2017-08-24 MED ORDER — METHOCARBAMOL 1000 MG/10ML IJ SOLN
500.0000 mg | Freq: Four times a day (QID) | INTRAVENOUS | Status: DC | PRN
  Filled 2017-08-24: qty 5

## 2017-08-24 MED ORDER — ACETAMINOPHEN 500 MG PO TABS
1000.0000 mg | ORAL_TABLET | Freq: Four times a day (QID) | ORAL | Status: DC
  Administered 2017-08-25: 1000 mg via ORAL
  Filled 2017-08-24 (×2): qty 2

## 2017-08-24 MED ORDER — CHLORHEXIDINE GLUCONATE 4 % EX LIQD: 60.0000 mL | Freq: Once | CUTANEOUS | Status: DC

## 2017-08-24 MED ORDER — METOCLOPRAMIDE HCL 5 MG/ML IJ SOLN: 5.0000 mg | Freq: Three times a day (TID) | INTRAMUSCULAR | Status: DC | PRN

## 2017-08-24 MED ORDER — MAGNESIUM CITRATE PO SOLN: 1.0000 | Freq: Once | ORAL | Status: DC | PRN

## 2017-08-24 MED ORDER — MIDAZOLAM HCL 2 MG/2ML IJ SOLN
INTRAMUSCULAR | Status: AC
  Filled 2017-08-24: qty 2

## 2017-08-24 MED ORDER — DEXAMETHASONE SODIUM PHOSPHATE 10 MG/ML IJ SOLN
INTRAMUSCULAR | Status: DC | PRN
Start: 2017-08-24 — End: 2017-08-24
  Administered 2017-08-24: 10 mg via INTRAVENOUS

## 2017-08-24 MED ORDER — ASPIRIN EC 325 MG PO TBEC
325.0000 mg | DELAYED_RELEASE_TABLET | Freq: Two times a day (BID) | ORAL | Status: DC
  Administered 2017-08-24 – 2017-08-25 (×2): 325 mg via ORAL
  Filled 2017-08-24 (×2): qty 1

## 2017-08-24 MED ORDER — PHENOL 1.4 % MT LIQD: 1.0000 | OROMUCOSAL | Status: DC | PRN

## 2017-08-24 MED ORDER — LIDOCAINE HCL (PF) 1 % IJ SOLN
INTRAMUSCULAR | Status: AC
  Filled 2017-08-24: qty 30

## 2017-08-24 MED ORDER — PROMETHAZINE HCL 25 MG/ML IJ SOLN: 6.2500 mg | INTRAMUSCULAR | Status: DC | PRN

## 2017-08-24 MED ORDER — FENTANYL CITRATE (PF) 250 MCG/5ML IJ SOLN
INTRAMUSCULAR | Status: AC
  Filled 2017-08-24: qty 5

## 2017-08-24 MED ORDER — KETOROLAC TROMETHAMINE 15 MG/ML IJ SOLN
INTRAMUSCULAR | Status: AC
  Administered 2017-08-24: 30 mg via INTRAVENOUS
  Filled 2017-08-24: qty 1

## 2017-08-24 MED ORDER — LISINOPRIL 20 MG PO TABS
20.0000 mg | ORAL_TABLET | Freq: Every day | ORAL | Status: DC
  Administered 2017-08-25: 20 mg via ORAL
  Filled 2017-08-24: qty 1

## 2017-08-24 MED ORDER — TRANEXAMIC ACID 1000 MG/10ML IV SOLN
2000.0000 mg | INTRAVENOUS | Status: DC
  Filled 2017-08-24: qty 20

## 2017-08-24 MED ORDER — ACETAMINOPHEN 325 MG PO TABS: 650.0000 mg | ORAL_TABLET | Freq: Four times a day (QID) | ORAL | Status: DC | PRN

## 2017-08-24 MED ORDER — PROMETHAZINE HCL 25 MG PO TABS: 25.0000 mg | ORAL_TABLET | Freq: Four times a day (QID) | ORAL | 1 refills | Status: DC | PRN

## 2017-08-24 MED ORDER — OXYCODONE HCL 5 MG PO TABS
ORAL_TABLET | ORAL | Status: AC
  Administered 2017-08-24: 10 mg via ORAL
  Filled 2017-08-24: qty 2

## 2017-08-24 MED ORDER — MIDAZOLAM HCL 2 MG/2ML IJ SOLN
INTRAMUSCULAR | Status: DC | PRN
  Administered 2017-08-24: 2 mg via INTRAVENOUS

## 2017-08-24 MED ORDER — SODIUM CHLORIDE 0.9 % IV SOLN
INTRAVENOUS | Status: DC
  Administered 2017-08-24: 75 mL/h via INTRAVENOUS

## 2017-08-24 MED ORDER — HYDROCHLOROTHIAZIDE 25 MG PO TABS
25.0000 mg | ORAL_TABLET | Freq: Every day | ORAL | Status: DC
  Administered 2017-08-25: 25 mg via ORAL
  Filled 2017-08-24: qty 1

## 2017-08-24 MED ORDER — TRANEXAMIC ACID 1000 MG/10ML IV SOLN
1000.0000 mg | Freq: Once | INTRAVENOUS | Status: AC
  Administered 2017-08-24: 1000 mg via INTRAVENOUS
  Filled 2017-08-24: qty 10

## 2017-08-24 MED ORDER — ONDANSETRON HCL 4 MG PO TABS: 4.0000 mg | ORAL_TABLET | Freq: Three times a day (TID) | ORAL | 0 refills | Status: DC | PRN

## 2017-08-24 MED ORDER — ONDANSETRON HCL 4 MG/2ML IJ SOLN
INTRAMUSCULAR | Status: DC | PRN
  Administered 2017-08-24: 4 mg via INTRAVENOUS

## 2017-08-24 MED ORDER — METOCLOPRAMIDE HCL 5 MG PO TABS: 5.0000 mg | ORAL_TABLET | Freq: Three times a day (TID) | ORAL | Status: DC | PRN

## 2017-08-24 MED ORDER — DEXAMETHASONE SODIUM PHOSPHATE 10 MG/ML IJ SOLN
INTRAMUSCULAR | Status: AC
  Filled 2017-08-24: qty 1

## 2017-08-24 MED ORDER — OXYCODONE HCL 5 MG PO TABS: 5.0000 mg | ORAL_TABLET | ORAL | 0 refills | Status: DC | PRN

## 2017-08-24 MED ORDER — AMLODIPINE BESYLATE 10 MG PO TABS
10.0000 mg | ORAL_TABLET | Freq: Every day | ORAL | Status: DC
  Administered 2017-08-24 – 2017-08-25 (×2): 10 mg via ORAL
  Filled 2017-08-24 (×2): qty 1

## 2017-08-24 MED ORDER — ASPIRIN EC 325 MG PO TBEC
325.0000 mg | DELAYED_RELEASE_TABLET | Freq: Two times a day (BID) | ORAL | 0 refills | Status: DC
Start: 2017-08-24 — End: 2017-12-26

## 2017-08-24 MED ORDER — DEXAMETHASONE SODIUM PHOSPHATE 10 MG/ML IJ SOLN
10.0000 mg | Freq: Once | INTRAMUSCULAR | Status: AC
  Administered 2017-08-25: 10 mg via INTRAVENOUS
  Filled 2017-08-24: qty 1

## 2017-08-24 MED ORDER — ALUM & MAG HYDROXIDE-SIMETH 200-200-20 MG/5ML PO SUSP: 30.0000 mL | ORAL | Status: DC | PRN

## 2017-08-24 MED ORDER — MORPHINE SULFATE (PF) 4 MG/ML IV SOLN: 1.0000 mg | INTRAVENOUS | Status: DC | PRN

## 2017-08-24 MED ORDER — FENTANYL CITRATE (PF) 100 MCG/2ML IJ SOLN
INTRAMUSCULAR | Status: DC | PRN
  Administered 2017-08-24 (×5): 50 ug via INTRAVENOUS

## 2017-08-24 MED ORDER — SENNOSIDES-DOCUSATE SODIUM 8.6-50 MG PO TABS: 1.0000 | ORAL_TABLET | Freq: Every evening | ORAL | 1 refills | Status: DC | PRN

## 2017-08-24 MED ORDER — ONDANSETRON HCL 4 MG/2ML IJ SOLN: 4.0000 mg | Freq: Four times a day (QID) | INTRAMUSCULAR | Status: DC | PRN

## 2017-08-24 MED ORDER — SORBITOL 70 % SOLN
30.0000 mL | Freq: Every day | Status: DC | PRN
Start: 2017-08-24 — End: 2017-08-25

## 2017-08-24 MED ORDER — DIPHENHYDRAMINE HCL 12.5 MG/5ML PO ELIX: 25.0000 mg | ORAL_SOLUTION | ORAL | Status: DC | PRN

## 2017-08-24 MED ORDER — ACETAMINOPHEN 650 MG RE SUPP: 650.0000 mg | Freq: Four times a day (QID) | RECTAL | Status: DC | PRN

## 2017-08-24 MED ORDER — HYDROMORPHONE HCL 1 MG/ML IJ SOLN
INTRAMUSCULAR | Status: AC
  Administered 2017-08-24: 0.5 mg via INTRAVENOUS
  Filled 2017-08-24: qty 1

## 2017-08-24 MED ORDER — MORPHINE SULFATE (PF) 2 MG/ML IV SOLN: 1.0000 mg | INTRAVENOUS | Status: DC | PRN

## 2017-08-24 MED ORDER — METHOCARBAMOL 500 MG PO TABS
500.0000 mg | ORAL_TABLET | Freq: Four times a day (QID) | ORAL | Status: DC | PRN
  Administered 2017-08-24 – 2017-08-25 (×3): 500 mg via ORAL
  Filled 2017-08-24 (×2): qty 1

## 2017-08-24 MED ORDER — MEPERIDINE HCL 25 MG/ML IJ SOLN: 6.2500 mg | INTRAMUSCULAR | Status: DC | PRN

## 2017-08-24 MED ORDER — HYDROMORPHONE HCL 1 MG/ML IJ SOLN
0.2500 mg | INTRAMUSCULAR | Status: DC | PRN
  Administered 2017-08-24 (×2): 0.5 mg via INTRAVENOUS

## 2017-08-24 MED ORDER — LACTATED RINGERS IV SOLN
INTRAVENOUS | Status: DC
  Administered 2017-08-24: 15:00:00 via INTRAVENOUS
  Administered 2017-08-24: 50 mL/h via INTRAVENOUS

## 2017-08-24 MED ORDER — ONDANSETRON HCL 4 MG/2ML IJ SOLN
INTRAMUSCULAR | Status: AC
  Filled 2017-08-24: qty 2

## 2017-08-24 MED ORDER — METOPROLOL TARTRATE 50 MG PO TABS
50.0000 mg | ORAL_TABLET | Freq: Two times a day (BID) | ORAL | Status: DC
  Administered 2017-08-24 – 2017-08-25 (×2): 50 mg via ORAL
  Filled 2017-08-24 (×2): qty 1

## 2017-08-24 MED ORDER — OXYCODONE HCL 5 MG PO TABS
5.0000 mg | ORAL_TABLET | ORAL | Status: DC | PRN
  Administered 2017-08-24: 10 mg via ORAL

## 2017-08-24 MED ORDER — CEFAZOLIN SODIUM-DEXTROSE 2-4 GM/100ML-% IV SOLN
2.0000 g | Freq: Four times a day (QID) | INTRAVENOUS | Status: AC
  Administered 2017-08-24 – 2017-08-25 (×2): 2 g via INTRAVENOUS
  Filled 2017-08-24 (×2): qty 100

## 2017-08-24 MED ORDER — PROPOFOL 10 MG/ML IV BOLUS
INTRAVENOUS | Status: AC
  Filled 2017-08-24: qty 20

## 2017-08-24 MED ORDER — MENTHOL 3 MG MT LOZG: 1.0000 | LOZENGE | OROMUCOSAL | Status: DC | PRN

## 2017-08-24 MED ORDER — SODIUM CHLORIDE 0.9 % IR SOLN
Status: DC | PRN
  Administered 2017-08-24: 3000 mL

## 2017-08-24 MED ORDER — PROPOFOL 10 MG/ML IV BOLUS
INTRAVENOUS | Status: DC | PRN
  Administered 2017-08-24: 130 mg via INTRAVENOUS
  Administered 2017-08-24: 30 mg via INTRAVENOUS

## 2017-08-24 MED ORDER — POLYETHYLENE GLYCOL 3350 17 G PO PACK: 17.0000 g | PACK | Freq: Every day | ORAL | Status: DC | PRN

## 2017-08-24 MED ORDER — LIDOCAINE 2% (20 MG/ML) 5 ML SYRINGE
INTRAMUSCULAR | Status: DC | PRN
  Administered 2017-08-24: 80 mg via INTRAVENOUS

## 2017-08-24 MED ORDER — ONDANSETRON HCL 4 MG PO TABS: 4.0000 mg | ORAL_TABLET | Freq: Four times a day (QID) | ORAL | Status: DC | PRN

## 2017-08-24 MED ORDER — SUGAMMADEX SODIUM 200 MG/2ML IV SOLN
INTRAVENOUS | Status: AC
  Filled 2017-08-24: qty 2

## 2017-08-24 MED ORDER — ROCURONIUM BROMIDE 10 MG/ML (PF) SYRINGE
PREFILLED_SYRINGE | INTRAVENOUS | Status: DC | PRN
  Administered 2017-08-24: 50 mg via INTRAVENOUS
  Administered 2017-08-24: 20 mg via INTRAVENOUS
  Administered 2017-08-24: 10 mg via INTRAVENOUS

## 2017-08-24 MED ORDER — ALBUMIN HUMAN 5 % IV SOLN
INTRAVENOUS | Status: DC | PRN
  Administered 2017-08-24: 15:00:00 via INTRAVENOUS

## 2017-08-24 MED ORDER — ATORVASTATIN CALCIUM 80 MG PO TABS
80.0000 mg | ORAL_TABLET | Freq: Every day | ORAL | Status: DC
  Administered 2017-08-24 – 2017-08-25 (×2): 80 mg via ORAL
  Filled 2017-08-24 (×2): qty 1

## 2017-08-24 MED ORDER — ALBUTEROL SULFATE (2.5 MG/3ML) 0.083% IN NEBU: 2.5000 mg | INHALATION_SOLUTION | Freq: Four times a day (QID) | RESPIRATORY_TRACT | Status: DC | PRN

## 2017-08-24 MED ORDER — CEFAZOLIN SODIUM-DEXTROSE 2-4 GM/100ML-% IV SOLN
2.0000 g | INTRAVENOUS | Status: AC
  Administered 2017-08-24: 2 g via INTRAVENOUS
  Filled 2017-08-24: qty 100

## 2017-08-24 MED ORDER — LIDOCAINE-EPINEPHRINE 1 %-1:100000 IJ SOLN
INTRAMUSCULAR | Status: AC
  Filled 2017-08-24: qty 1

## 2017-08-24 MED ORDER — LIDOCAINE 2% (20 MG/ML) 5 ML SYRINGE
INTRAMUSCULAR | Status: AC
  Filled 2017-08-24: qty 5

## 2017-08-24 MED ORDER — LISINOPRIL-HYDROCHLOROTHIAZIDE 20-25 MG PO TABS: 1.0000 | ORAL_TABLET | Freq: Every day | ORAL | Status: DC

## 2017-08-24 MED ORDER — FENTANYL CITRATE (PF) 100 MCG/2ML IJ SOLN: INTRAMUSCULAR | Status: DC | PRN

## 2017-08-24 MED ORDER — OXYCODONE HCL ER 10 MG PO T12A: 10.0000 mg | EXTENDED_RELEASE_TABLET | Freq: Two times a day (BID) | ORAL | 0 refills | Status: DC

## 2017-08-24 MED ORDER — SUGAMMADEX SODIUM 200 MG/2ML IV SOLN
INTRAVENOUS | Status: DC | PRN
  Administered 2017-08-24: 150 mg via INTRAVENOUS

## 2017-08-24 MED ORDER — NITROGLYCERIN 0.4 MG SL SUBL: 0.4000 mg | SUBLINGUAL_TABLET | SUBLINGUAL | Status: DC | PRN

## 2017-08-24 MED ORDER — KETOROLAC TROMETHAMINE 15 MG/ML IJ SOLN
30.0000 mg | Freq: Four times a day (QID) | INTRAMUSCULAR | Status: DC
  Administered 2017-08-24 – 2017-08-25 (×2): 30 mg via INTRAVENOUS
  Filled 2017-08-24: qty 2

## 2017-08-24 MED ORDER — METHOCARBAMOL 750 MG PO TABS: 750.0000 mg | ORAL_TABLET | Freq: Two times a day (BID) | ORAL | 0 refills | Status: DC | PRN

## 2017-08-24 MED ORDER — TRANEXAMIC ACID 1000 MG/10ML IV SOLN
INTRAVENOUS | Status: AC | PRN
  Administered 2017-08-24: 2000 mg via TOPICAL

## 2017-08-24 MED ORDER — KETOROLAC TROMETHAMINE 15 MG/ML IJ SOLN
INTRAMUSCULAR | Status: AC
  Filled 2017-08-24: qty 1

## 2017-08-24 MED ORDER — OXYCODONE HCL ER 15 MG PO T12A
15.0000 mg | EXTENDED_RELEASE_TABLET | Freq: Two times a day (BID) | ORAL | Status: DC
  Administered 2017-08-24 – 2017-08-25 (×2): 15 mg via ORAL
  Filled 2017-08-24 (×2): qty 1

## 2017-08-24 MED ORDER — POTASSIUM CHLORIDE CRYS ER 20 MEQ PO TBCR
20.0000 meq | EXTENDED_RELEASE_TABLET | Freq: Every day | ORAL | Status: DC
  Administered 2017-08-24 – 2017-08-25 (×2): 20 meq via ORAL
  Filled 2017-08-24 (×2): qty 1

## 2017-08-24 SURGICAL SUPPLY — 44 items
BAG DECANTER FOR FLEXI CONT (MISCELLANEOUS) ×2 IMPLANT
CAPT HIP TOTAL 2 ×2 IMPLANT
CELLS DAT CNTRL 66122 CELL SVR (MISCELLANEOUS) ×2 IMPLANT
COVER SURGICAL LIGHT HANDLE (MISCELLANEOUS) ×2 IMPLANT
DRAPE C-ARM 42X72 X-RAY (DRAPES) ×2 IMPLANT
DRAPE STERI IOBAN 125X83 (DRAPES) ×2 IMPLANT
DRAPE SURG ISO 125X83 STRL (DRAPES) ×2 IMPLANT
DRAPE U-SHAPE 47X51 STRL (DRAPES) ×4 IMPLANT
DRSG AQUACEL AG ADV 3.5X10 (GAUZE/BANDAGES/DRESSINGS) ×2 IMPLANT
DURAPREP 26ML APPLICATOR (WOUND CARE) ×2 IMPLANT
ELECT BLADE 4.0 EZ CLEAN MEGAD (MISCELLANEOUS) ×2
ELECT REM PT RETURN 9FT ADLT (ELECTROSURGICAL) ×2
ELECTRODE BLDE 4.0 EZ CLN MEGD (MISCELLANEOUS) ×1 IMPLANT
ELECTRODE REM PT RTRN 9FT ADLT (ELECTROSURGICAL) ×1 IMPLANT
GLOVE BIO SURGEON STRL SZ 6.5 (GLOVE) ×2 IMPLANT
GLOVE SKINSENSE NS SZ7.5 (GLOVE) ×1
GLOVE SKINSENSE STRL SZ7.5 (GLOVE) ×1 IMPLANT
GLOVE SURG SYN 7.5  E (GLOVE) ×2
GLOVE SURG SYN 7.5 E (GLOVE) ×2 IMPLANT
GOWN SRG XL XLNG 56XLVL 4 (GOWN DISPOSABLE) ×1 IMPLANT
GOWN STRL NON-REIN XL XLG LVL4 (GOWN DISPOSABLE) ×1
GOWN STRL REUS W/ TWL LRG LVL3 (GOWN DISPOSABLE) IMPLANT
GOWN STRL REUS W/TWL LRG LVL3 (GOWN DISPOSABLE)
HANDPIECE INTERPULSE COAX TIP (DISPOSABLE) ×1
HOOD PEEL AWAY FLYTE STAYCOOL (MISCELLANEOUS) ×4 IMPLANT
IV NS IRRIG 3000ML ARTHROMATIC (IV SOLUTION) ×2 IMPLANT
KIT BASIN OR (CUSTOM PROCEDURE TRAY) ×2 IMPLANT
MARKER SKIN DUAL TIP RULER LAB (MISCELLANEOUS) ×2 IMPLANT
PACK TOTAL JOINT (CUSTOM PROCEDURE TRAY) ×2 IMPLANT
PACK UNIVERSAL I (CUSTOM PROCEDURE TRAY) ×2 IMPLANT
RTRCTR WOUND ALEXIS 18CM MED (MISCELLANEOUS) ×4
SAW OSC TIP CART 19.5X105X1.3 (SAW) ×2 IMPLANT
SET HNDPC FAN SPRY TIP SCT (DISPOSABLE) ×1 IMPLANT
STAPLER VISISTAT 35W (STAPLE) IMPLANT
STRIP CLOSURE SKIN 1/2X4 (GAUZE/BANDAGES/DRESSINGS) ×2 IMPLANT
SUT ETHIBOND 2 V 37 (SUTURE) ×2 IMPLANT
SUT MNCRL AB 3-0 PS2 18 (SUTURE) ×2 IMPLANT
SUT VIC AB 1 CT1 27 (SUTURE) ×1
SUT VIC AB 1 CT1 27XBRD ANBCTR (SUTURE) ×1 IMPLANT
SUT VIC AB 2-0 CT1 27 (SUTURE) ×1
SUT VIC AB 2-0 CT1 TAPERPNT 27 (SUTURE) ×1 IMPLANT
TOWEL OR 17X26 10 PK STRL BLUE (TOWEL DISPOSABLE) ×2 IMPLANT
TRAY CATH 16FR W/PLASTIC CATH (SET/KITS/TRAYS/PACK) ×2 IMPLANT
YANKAUER SUCT BULB TIP NO VENT (SUCTIONS) ×2 IMPLANT

## 2017-08-24 NOTE — Anesthesia Procedure Notes (Signed)
Procedure Name: Intubation Date/Time: 08/24/2017 1:55 PM Performed by: Sampson Si E Pre-anesthesia Checklist: Patient identified, Emergency Drugs available, Suction available and Patient being monitored Patient Re-evaluated:Patient Re-evaluated prior to induction Oxygen Delivery Method: Circle System Utilized Preoxygenation: Pre-oxygenation with 100% oxygen Induction Type: IV induction Ventilation: Mask ventilation without difficulty Laryngoscope Size: Mac and 4 Grade View: Grade I Tube type: Oral Tube size: 7.5 mm Number of attempts: 1 Airway Equipment and Method: Stylet Placement Confirmation: ETT inserted through vocal cords under direct vision,  positive ETCO2 and breath sounds checked- equal and bilateral Secured at: 21 cm Tube secured with: Tape Dental Injury: Teeth and Oropharynx as per pre-operative assessment

## 2017-08-24 NOTE — Op Note (Signed)
RIGHT TOTAL HIP ARTHROPLASTY ANTERIOR APPROACH  Procedure Note WILLIM TURNAGE   [id number]  Pre-op Diagnosis: right hip degenerative joint disease     Post-op Diagnosis: same   Operative Procedures  1. Total hip replacement; Right hip; uncemented [id number]   Personnel  Surgeon(s): Leandrew Koyanagi, MD   Anesthesia: general  Prosthesis: Depuy Acetabulum: Pinnacle 54 mm Femur: Corail KA 11 Head: 36 size: +12 Liner: neutral Bearing Type: ceramic on poly  Total Hip Arthroplasty (Anterior Approach) Op Note:  After informed consent was obtained and the operative extremity marked in the holding area, the patient was brought back to the operating room and placed supine on the HANA table. Next, the operative extremity was prepped and draped in normal sterile fashion. Surgical timeout occurred verifying patient identification, surgical site, surgical procedure and administration of antibiotics.  A modified anterior Smith-Peterson approach to the hip was performed, using the interval between tensor fascia lata and sartorius.  Dissection was carried bluntly down onto the anterior hip capsule. The lateral femoral circumflex vessels were identified and coagulated. A capsulotomy was performed and the capsular flaps tagged for later repair.  Fluoroscopy was utilized to prepare for the femoral neck cut. The neck osteotomy was performed. The femoral head was removed, the acetabular rim was cleared of soft tissue and attention was turned to reaming the acetabulum.  Sequential reaming was performed under fluoroscopic guidance. We reamed to a size 53 mm, and then impacted the acetabular shell. The liner was then placed after irrigation and attention turned to the femur.  After placing the femoral hook, the leg was taken to externally rotated, extended and adducted position taking care to perform soft tissue releases to allow for adequate mobilization of the femur. Soft tissue was cleared from the  shoulder of the greater trochanter and the hook elevator used to improve exposure of the proximal femur. Sequential broaching performed up to a size 11. Trial neck and head were placed. The leg was brought back up to neutral and the construct reduced. The position and sizing of components, offset and leg lengths were checked using fluoroscopy. Stability of the  construct was checked in extension and external rotation without any subluxation or impingement of prosthesis. We dislocated the prosthesis, dropped the leg back into position, removed trial components, and irrigated copiously. The final stem and head was then placed, the leg brought back up, the system reduced and fluoroscopy used to verify positioning.  We irrigated, obtained hemostasis and closed the capsule using #2 ethibond suture.  Dilute betadyne solution was used. The fascia was closed with #1 vicryl plus, the deep fat layer was closed with 0 vicryl, the subcutaneous layers closed with 2.0 Vicryl Plus and the skin closed with 3.0 monocryl and steri strips. A sterile dressing was applied. The patient was awakened in the operating room and taken to recovery in stable condition.  All sponge, needle, and instrument counts were correct at the end of the case.   Position: supine  Complications: none.  Time Out: performed   Drains/Packing: none  Estimated blood loss: 100 cc  Returned to Recovery Room: in good condition.   Antibiotics: yes   Mechanical VTE (DVT) Prophylaxis: sequential compression devices, TED thigh-high  Chemical VTE (DVT) Prophylaxis: aspirin   Fluid Replacement: see anesthesia record  Specimens Removed: 1 to pathology   Sponge and Instrument Count Correct? yes   PACU: portable radiograph - low AP   Admission: inpatient status  Plan/RTC: Return in 2 weeks for  staple removal. Weight Bearing/Load Lower Extremity: full  Hip precautions: none Suture Removal: 10-14 days  Betadine to incision twice daily once  dressing is removed on POD#7  N. Eduard Roux, MD [place] 3:39 PM      Implant Name Type Inv. Item Serial No. Manufacturer Lot No. LRB No. Used  ACETABULAR CUP Daisy Blossom 54MM - [id number] Plate ACETABULAR CUP W GRIPTION 54MM  DEPUY SYNTHES [id number] Right 1  LINER NEUTRAL 36ID 54OD - [id number] Liner LINER NEUTRAL 36ID 54OD  DEPUY SYNTHES [id number] Right 1  STEM CORAIL KA11 - [id number] Stem STEM CORAIL KA11  DEPUY SYNTHES [id number] Right 1  HEAD CERAMIC BIO DELTA 36 - [id number] Hips HEAD CERAMIC BIO DELTA 36   DEPUY SYNTHES [id number] Right 1

## 2017-08-24 NOTE — Anesthesia Preprocedure Evaluation (Addendum)
Anesthesia Evaluation  Patient identified by MRN, date of birth, ID band Patient awake    Reviewed: Allergy & Precautions, NPO status , Patient's Chart, lab work & pertinent test results  Airway Mallampati: II  TM Distance: >3 FB Neck ROM: Full    Dental no notable dental hx.    Pulmonary shortness of breath, asthma , sleep apnea , former smoker,    breath sounds clear to auscultation       Cardiovascular hypertension, + CAD and + Past MI   Rhythm:Regular Rate:Normal     Neuro/Psych    GI/Hepatic negative GI ROS, Neg liver ROS,   Endo/Other  negative endocrine ROS  Renal/GU negative Renal ROS     Musculoskeletal  (+) Arthritis ,   Abdominal   Peds  Hematology   Anesthesia Other Findings   Reproductive/Obstetrics                           Anesthesia Physical Anesthesia Plan  ASA: III  Anesthesia Plan: General   Post-op Pain Management:    Induction: Intravenous  PONV Risk Score and Plan: 2 and Ondansetron and Dexamethasone  Airway Management Planned: Oral ETT  Additional Equipment:   Intra-op Plan:   Post-operative Plan: Extubation in OR  Informed Consent: I have reviewed the patients History and Physical, chart, labs and discussed the procedure including the risks, benefits and alternatives for the proposed anesthesia with the patient or authorized representative who has indicated his/her understanding and acceptance.     Plan Discussed with: CRNA  Anesthesia Plan Comments:        Anesthesia Quick Evaluation

## 2017-08-24 NOTE — Transfer of Care (Signed)
Immediate Anesthesia Transfer of Care Note  Patient: Kenneth Long  Procedure(s) Performed: Procedure(s) with comments: RIGHT TOTAL HIP ARTHROPLASTY ANTERIOR APPROACH (Right) - RIGHT TOTAL HIP ARTHROPLASTY ANTERIOR APPROACH  Patient Location: PACU  Anesthesia Type:General  Level of Consciousness: drowsy and patient cooperative  Airway & Oxygen Therapy: Patient Spontanous Breathing and Patient connected to nasal cannula oxygen  Post-op Assessment: Report given to RN and Patient moving all extremities X 4  Post vital signs: Reviewed and stable  Last Vitals:  Vitals:   08/24/17 1006  BP: 128/81  Pulse: 70  Resp: 18  Temp: 36.5 C  SpO2: 99%    Last Pain:  Vitals:   08/24/17 1035  TempSrc:   PainSc: 6       Patients Stated Pain Goal: 7 (53/64/68 0321)  Complications: No apparent anesthesia complications

## 2017-08-24 NOTE — Discharge Instructions (Signed)

## 2017-08-24 NOTE — H&P (Signed)
PREOPERATIVE H&P  Chief Complaint: right hip degenerative joint disease  HPI: Kenneth Long is a 51 y.o. male who presents for surgical treatment of right hip degenerative joint disease.  He denies any changes in medical history.  Past Medical History:  Diagnosis Date  . Arthritis   . Asthma   . Coronary artery disease   . Dyspnea    "sometimes" with exertion   . Headache   . Heart murmur   . Hypertension   . Sleep apnea    no CPAP  . ST elevation (STEMI) myocardial infarction involving left circumflex coronary artery ([name]) 03/16/2016   DES CFX  . Umbilical hernia    Past Surgical History:  Procedure Laterality Date  . CARDIAC CATHETERIZATION N/A 03/16/2016   Procedure: Left Heart Cath and Coronary Angiography;  Surgeon: Kenneth M Martinique, MD;  LAD OK, CFX 100%, OM1 20%, RCA 20%, EF nl  . CARDIAC CATHETERIZATION N/A 03/16/2016   Procedure: Coronary Stent Intervention;  Surgeon: Kenneth M Martinique, MD; PROMUS PREM MR 3.0X16 mm DES   . UMBILICAL HERNIA REPAIR  6433   umbilical hernia repair   Social History   Social History  . Marital status: Single    Spouse name: N/A  . Number of children: 2  . Years of education: N/A   Occupational History  . Previously supervised cleaning crew at [employer] Topics  . Smoking status: Former Smoker    Types: Cigarettes    Quit date: 03/18/2016  . Smokeless tobacco: Never Used  . Alcohol use 0.0 oz/week     Comment: 2 beers every few days  . Drug use: No  . Sexual activity: Not on file   Other Topics Concern  . Not on file   Social History Narrative   Lives by himself   Unemployed due to hip problems   Born and raised in [place] since early 1980s.      Family History  Problem Relation Age of Onset  . Hypertension Mother   . Sarcoidosis Mother   . Hypertension Father   . Hypertension Sister   . Hypertension Sister    Allergies  Allergen  Reactions  . Peanut-Containing Drug Products Anaphylaxis and Swelling  . Shellfish Allergy Anaphylaxis and Swelling   Prior to Admission medications   Medication Sig Start Date End Date Taking? Authorizing Provider  amLODipine (NORVASC) 10 MG tablet Take 1 tablet (10 mg total) by mouth daily. 03/23/17  Yes Kenneth Hook, MD  aspirin 81 MG chewable tablet Chew 1 tablet (81 mg total) by mouth daily. 03/18/16  Yes Kenneth Canales, PA-C  atorvastatin (LIPITOR) 80 MG tablet Take 1 tablet (80 mg total) by mouth daily. 06/08/17 09/06/17 Yes Long, Kenneth M, MD  lisinopril-hydrochlorothiazide (PRINZIDE,ZESTORETIC) 20-25 MG tablet Take 1 tablet by mouth daily. 03/25/16  Yes Barrett, Evelene Croon, PA-C  metoprolol (LOPRESSOR) 50 MG tablet Take 1 tablet (50 mg total) by mouth 2 (two) times daily. 03/23/17  Yes Kenneth Hook, MD  nitroGLYCERIN (NITROSTAT) 0.4 MG SL tablet Place 1 tablet (0.4 mg total) under the tongue every 5 (five) minutes as needed for chest pain. 03/18/16  Yes Kenneth Canales, PA-C  omega-3 acid ethyl esters (LOVAZA) 1 g capsule Take 2 capsules ( 2 grams ) twice a day 06/08/17  Yes Long, Kenneth M, MD  potassium chloride SA (K-DUR,KLOR-CON) 20 MEQ tablet Take 2 tablets ( 40 meq) daily for  2 days then 1 tablet ( 20 meq ) daily Patient taking differently: Take 20 mEq by mouth daily.  03/25/17  Yes Kenneth Man, MD  albuterol (PROVENTIL HFA;VENTOLIN HFA) 108 (90 Base) MCG/ACT inhaler Inhale 2 puffs into the lungs every 6 (six) hours as needed for wheezing or shortness of breath. 04/25/17   Kenneth Hook, MD  fexofenadine (ALLEGRA) 180 MG tablet Take 1 tablet (180 mg total) by mouth daily. 04/25/17   Kenneth Hook, MD  mometasone (NASONEX) 50 MCG/ACT nasal spray 2 sprays each nostril daily Patient not taking: Reported on 08/12/2017 04/25/17   Kenneth Hook, MD  traMADol (ULTRAM) 50 MG tablet Take 1 tablet (50 mg total) by mouth every 6 (six) hours as needed for moderate pain. Patient  not taking: Reported on 08/12/2017 06/23/17   Kenneth Diss, DO     Positive ROS: All other systems have been reviewed and were otherwise negative with the exception of those mentioned in the HPI and as above.  Physical Exam: General: Alert, no acute distress Cardiovascular: No pedal edema Respiratory: No cyanosis, no use of accessory musculature GI: abdomen soft Skin: No lesions in the area of chief complaint Neurologic: Sensation intact distally Psychiatric: Patient is competent for consent with normal mood and affect Lymphatic: no lymphedema  MUSCULOSKELETAL: exam stable  Assessment: right hip degenerative joint disease  Plan: Plan for Procedure(s): RIGHT TOTAL HIP ARTHROPLASTY ANTERIOR APPROACH  The risks benefits and alternatives were discussed with the patient including but not limited to the risks of nonoperative treatment, versus surgical intervention including infection, bleeding, nerve injury,  blood clots, cardiopulmonary complications, morbidity, mortality, among others, and they were willing to proceed.   Kenneth Roux, MD   08/24/2017 12:42 PM

## 2017-08-25 ENCOUNTER — Encounter (HOSPITAL_COMMUNITY): Payer: Self-pay | Admitting: Orthopaedic Surgery

## 2017-08-25 LAB — CBC
HEMATOCRIT: 28.7 % — AB (ref 39.0–52.0)
HEMOGLOBIN: 9.6 g/dL — AB (ref 13.0–17.0)
MCH: 30.4 pg (ref 26.0–34.0)
MCHC: 33.4 g/dL (ref 30.0–36.0)
MCV: 90.8 fL (ref 78.0–100.0)
PLATELETS: 276 10*3/uL (ref 150–400)
RBC: 3.16 MIL/uL — AB (ref 4.22–5.81)
RDW: 13.7 % (ref 11.5–15.5)
WBC: 16.1 10*3/uL — AB (ref 4.0–10.5)

## 2017-08-25 LAB — BASIC METABOLIC PANEL
ANION GAP: 10 (ref 5–15)
BUN: 9 mg/dL (ref 6–20)
CHLORIDE: 105 mmol/L (ref 101–111)
CO2: 25 mmol/L (ref 22–32)
Calcium: 9.4 mg/dL (ref 8.9–10.3)
Creatinine, Ser: 0.99 mg/dL (ref 0.61–1.24)
GFR calc Af Amer: >60 mL/min (ref >60)
GFR calc non Af Amer: >60 mL/min (ref >60)
GLUCOSE: 131 mg/dL — AB (ref 65–99)
POTASSIUM: 3.1 mmol/L — AB (ref 3.5–5.1)
Sodium: 140 mmol/L (ref 135–145)

## 2017-08-25 NOTE — Progress Notes (Signed)
   Subjective:  Patient reports pain as mild.  No events.  Objective:   VITALS:   Vitals:   08/24/17 1800 08/24/17 1852 08/24/17 2125 08/25/17 0403  BP: 134/86 128/79 126/84 122/76  Pulse: 66 78 85 76  Resp: 16 16 20 20   Temp: 97.7 F (36.5 C) 98.3 F (36.8 C) 98.7 F (37.1 C) 98.5 F (36.9 C)  TempSrc:  Oral Oral Oral  SpO2: 95% 96% 97% 100%    Neurologically intact Neurovascular intact Sensation intact distally Intact pulses distally Dorsiflexion/Plantar flexion intact Incision: dressing C/D/I and no drainage No cellulitis present Compartment soft   Lab Results  Component Value Date   WBC 16.1 (H) 08/25/2017   HGB 9.6 (L) 08/25/2017   HCT 28.7 (L) 08/25/2017   MCV 90.8 08/25/2017   PLT 276 08/25/2017     Assessment/Plan:  1 Day Post-Op   - Expected postop acute blood loss anemia - will monitor for symptoms - Up with PT/OT - DVT ppx - SCDs, ambulation, aspirin - WBAT operative extremity - Pain control - Discharge planning  Eduard Roux 08/25/2017, 7:06 AM [phone]

## 2017-08-25 NOTE — Discharge Summary (Signed)
Physician Discharge Summary      Patient ID: Kenneth Long MRN: [id number] DOB/AGE: 1966-07-20 51 y.o.  Admit date: 08/24/2017 Discharge date: 08/25/2017  Admission Diagnoses:  <principal problem not specified>  Discharge Diagnoses:  Active Problems:   History of hip replacement   Past Medical History:  Diagnosis Date  . Arthritis   . Asthma   . Coronary artery disease   . Dyspnea    "sometimes" with exertion   . Headache   . Heart murmur   . Hypertension   . Sleep apnea    no CPAP  . ST elevation (STEMI) myocardial infarction involving left circumflex coronary artery ([place]) 03/16/2016   DES CFX  . Umbilical hernia     Surgeries: Procedure(s): RIGHT TOTAL HIP ARTHROPLASTY ANTERIOR APPROACH on 08/24/2017   Consultants (if any):   Discharged Condition: Improved  Hospital Course: Kenneth Long is an 51 y.o. male who was admitted 08/24/2017 with a diagnosis of <principal problem not specified> and went to the operating room on 08/24/2017 and underwent the above named procedures.    He was given perioperative antibiotics:  Anti-infectives    Start     Dose/Rate Route Frequency Ordered Stop   08/24/17 2000  ceFAZolin (ANCEF) IVPB 2g/100 mL premix     2 g 200 mL/hr over 30 Minutes Intravenous Every 6 hours 08/24/17 1851 08/25/17 0448   08/24/17 1008  ceFAZolin (ANCEF) IVPB 2g/100 mL premix     2 g 200 mL/hr over 30 Minutes Intravenous On call to O.R. 08/24/17 1008 08/24/17 1356    .  He was given sequential compression devices, early ambulation, and aspirin for DVT prophylaxis.  He benefited maximally from the hospital stay and there were no complications.    Recent vital signs:  Vitals:   08/25/17 1148 08/25/17 1313  BP:  115/74  Pulse:  69  Resp:  16  Temp:  98.4 F (36.9 C)  SpO2: 98% 98%    Recent laboratory studies:  Lab Results  Component Value Date   HGB 9.6 (L) 08/25/2017   HGB 11.2 (L) 08/24/2017   HGB 11.8 (L) 08/16/2017   Lab  Results  Component Value Date   WBC 16.1 (H) 08/25/2017   PLT 276 08/25/2017   Lab Results  Component Value Date   INR 0.89 08/24/2017   Lab Results  Component Value Date   NA 140 08/25/2017   K 3.1 (L) 08/25/2017   CL 105 08/25/2017   CO2 25 08/25/2017   BUN 9 08/25/2017   CREATININE 0.99 08/25/2017   GLUCOSE 131 (H) 08/25/2017    Discharge Medications:   Allergies as of 08/25/2017      Reactions   Peanut-containing Drug Products Anaphylaxis, Swelling   Shellfish Allergy Anaphylaxis, Swelling      Medication List    STOP taking these medications   aspirin 81 MG chewable tablet Replaced by:  aspirin EC 325 MG tablet     TAKE these medications   albuterol 108 (90 Base) MCG/ACT inhaler Commonly known as:  PROVENTIL HFA;VENTOLIN HFA Inhale 2 puffs into the lungs every 6 (six) hours as needed for wheezing or shortness of breath.   amLODipine 10 MG tablet Commonly known as:  NORVASC Take 1 tablet (10 mg total) by mouth daily.   aspirin EC 325 MG tablet Take 1 tablet (325 mg total) by mouth 2 (two) times daily. Replaces:  aspirin 81 MG chewable tablet   atorvastatin 80 MG tablet Commonly known as:  LIPITOR  Take 1 tablet (80 mg total) by mouth daily.   fexofenadine 180 MG tablet Commonly known as:  ALLEGRA Take 1 tablet (180 mg total) by mouth daily.   lisinopril-hydrochlorothiazide 20-25 MG tablet Commonly known as:  PRINZIDE,ZESTORETIC Take 1 tablet by mouth daily.   methocarbamol 750 MG tablet Commonly known as:  ROBAXIN Take 1 tablet (750 mg total) by mouth 2 (two) times daily as needed for muscle spasms.   metoprolol tartrate 50 MG tablet Commonly known as:  LOPRESSOR Take 1 tablet (50 mg total) by mouth 2 (two) times daily.   mometasone 50 MCG/ACT nasal spray Commonly known as:  NASONEX 2 sprays each nostril daily   nitroGLYCERIN 0.4 MG SL tablet Commonly known as:  NITROSTAT Place 1 tablet (0.4 mg total) under the tongue every 5 (five) minutes as  needed for chest pain.   omega-3 acid ethyl esters 1 g capsule Commonly known as:  LOVAZA Take 2 capsules ( 2 grams ) twice a day   ondansetron 4 MG tablet Commonly known as:  ZOFRAN Take 1-2 tablets (4-8 mg total) by mouth every 8 (eight) hours as needed for nausea or vomiting.   oxyCODONE 5 MG immediate release tablet Commonly known as:  Oxy IR/ROXICODONE Take 1-3 tablets (5-15 mg total) by mouth every 4 (four) hours as needed.   oxyCODONE 10 mg 12 hr tablet Commonly known as:  OXYCONTIN Take 1 tablet (10 mg total) by mouth every 12 (twelve) hours.   potassium chloride SA 20 MEQ tablet Commonly known as:  K-DUR,KLOR-CON Take 2 tablets ( 40 meq) daily for 2 days then 1 tablet ( 20 meq ) daily What changed:  how much to take  how to take this  when to take this  additional instructions   promethazine 25 MG tablet Commonly known as:  PHENERGAN Take 1 tablet (25 mg total) by mouth every 6 (six) hours as needed for nausea.   senna-docusate 8.6-50 MG tablet Commonly known as:  SENOKOT S Take 1 tablet by mouth at bedtime as needed.   traMADol 50 MG tablet Commonly known as:  ULTRAM Take 1 tablet (50 mg total) by mouth every 6 (six) hours as needed for moderate pain.            Durable Medical Equipment        Start     Ordered   08/24/17 1852  DME Walker rolling  Once    Question:  Patient needs a walker to treat with the following condition  Answer:  History of hip replacement   08/24/17 1851   08/24/17 1852  DME 3 n 1  Once     08/24/17 1851   08/24/17 1852  DME Bedside commode  Once    Question:  Patient needs a bedside commode to treat with the following condition  Answer:  History of hip replacement   08/24/17 1851       Discharge Care Instructions        Start     Ordered   08/25/17 0000  Call MD / Call 911    Comments:  If you experience chest pain or shortness of breath, CALL 911 and be transported to the hospital emergency room.  If you  develope a fever above 101.5 F, pus (white drainage) or increased drainage or redness at the wound, or calf pain, call your surgeon's office.   08/25/17 1652   08/25/17 0000  Constipation Prevention    Comments:  Drink plenty of fluids.  Prune juice may be helpful.  You may use a stool softener, such as Colace (over the counter) 100 mg twice a day.  Use MiraLax (over the counter) for constipation as needed.   08/25/17 1652   08/25/17 0000  Increase activity slowly as tolerated     08/25/17 1652   08/25/17 0000  Driving restrictions    Comments:  No driving while taking narcotic pain meds.   08/25/17 1652   08/24/17 0000  aspirin EC 325 MG tablet  2 times daily     08/24/17 1541   08/24/17 0000  methocarbamol (ROBAXIN) 750 MG tablet  2 times daily PRN     08/24/17 1541   08/24/17 0000  ondansetron (ZOFRAN) 4 MG tablet  Every 8 hours PRN     08/24/17 1541   08/24/17 0000  oxyCODONE (OXY IR/ROXICODONE) 5 MG immediate release tablet  Every 4 hours PRN     08/24/17 1541   08/24/17 0000  oxyCODONE (OXYCONTIN) 10 mg 12 hr tablet  Every 12 hours     08/24/17 1541   08/24/17 0000  promethazine (PHENERGAN) 25 MG tablet  Every 6 hours PRN     08/24/17 1541   08/24/17 0000  senna-docusate (SENOKOT S) 8.6-50 MG tablet  At bedtime PRN     08/24/17 1541      Diagnostic Studies: Dg Pelvis Portable  Result Date: 08/24/2017 CLINICAL DATA:  Right total hip replacement. EXAM: PORTABLE PELVIS 1-2 VIEWS COMPARISON:  Intra op radiographs from the same day. FINDINGS: Right total hip arthroplasty is present. The femoral and acetabular components are well seated. No acute fractures present. Gas is present joint. IMPRESSION: Right total hip arthroplasty without radiographic evidence for complication. Electronically Signed   By: San Morelle M.D.   On: 08/24/2017 16:26   Dg C-arm 61-120 Min  Result Date: 08/24/2017 CLINICAL DATA:  Right hip arthroplasty EXAM: OPERATIVE right HIP (WITH PELVIS IF PERFORMED)   VIEWS TECHNIQUE: Fluoroscopic spot image(s) were submitted for interpretation post-operatively. COMPARISON:  06/23/2017 FINDINGS: Three low resolution intraoperative spot views of the right hip. Total fluoroscopy time was 36 seconds. The images were acquired during operative course of the right hip arthroplasty. IMPRESSION: Fluoroscopic assistance provided during right hip surgery Electronically Signed   By: Donavan Foil M.D.   On: 08/24/2017 15:46   Dg Hip Operative Unilat W Or W/o Pelvis Right  Result Date: 08/24/2017 CLINICAL DATA:  Right hip arthroplasty EXAM: OPERATIVE right HIP (WITH PELVIS IF PERFORMED)  VIEWS TECHNIQUE: Fluoroscopic spot image(s) were submitted for interpretation post-operatively. COMPARISON:  06/23/2017 FINDINGS: Three low resolution intraoperative spot views of the right hip. Total fluoroscopy time was 36 seconds. The images were acquired during operative course of the right hip arthroplasty. IMPRESSION: Fluoroscopic assistance provided during right hip surgery Electronically Signed   By: Donavan Foil M.D.   On: 08/24/2017 15:46    Disposition: 01-Home or Self Care  Discharge Instructions    Call MD / Call 911    Complete by:  As directed    If you experience chest pain or shortness of breath, CALL 911 and be transported to the hospital emergency room.  If you develope a fever above 101.5 F, pus (white drainage) or increased drainage or redness at the wound, or calf pain, call your surgeon's office.   Constipation Prevention    Complete by:  As directed    Drink plenty of fluids.  Prune juice may be helpful.  You may use a stool softener,  such as Colace (over the counter) 100 mg twice a day.  Use MiraLax (over the counter) for constipation as needed.   Driving restrictions    Complete by:  As directed    No driving while taking narcotic pain meds.   Increase activity slowly as tolerated    Complete by:  As directed       Follow-up Information    Leandrew Koyanagi, MD  Follow up in 2 week(s).   Specialty:  Orthopedic Surgery Why:  For suture removal, For wound re-check Contact information: [place] [address] [phone]        [facility] Follow up.   Why:  A representative from [facility] will contact you to arrange start date and time for your therapy. Contact information: [address] [place] [address] [phone]            Signed: Eduard Roux 08/25/2017, 4:53 PM

## 2017-08-25 NOTE — Progress Notes (Signed)
Physical Therapy Treatment Patient Details Name: Kenneth Long MRN: [id number] DOB: 1966-05-20 Today's Date: 08/25/2017    History of Present Illness 51 yo admitted for Right Anterior THA. PMHx: Asthma, CAD, HTN, sleep apnea    PT Comments    Pt with continued demonstration of excellent gait and activity tolerance. Pt able to complete stairs, transfers, gait and standing HEP without difficulty and safe for return home. Will follow acutely.   Follow Up Recommendations  DC plan and follow up therapy as arranged by surgeon     Equipment Recommendations  Rolling walker with 5" wheels    Recommendations for Other Services       Precautions / Restrictions Precautions Precautions: None Restrictions Weight Bearing Restrictions: Yes RLE Weight Bearing: Weight bearing as tolerated    Mobility  Bed Mobility Overal bed mobility: Modified Independent             General bed mobility comments: supine<>sit  Transfers Overall transfer level: Modified independent   Transfers: Sit to/from Stand Sit to Stand: Min guard            Ambulation/Gait Ambulation/Gait assistance: Supervision Ambulation Distance (Feet): 600 Feet Assistive device: Rolling walker (2 wheeled) Gait Pattern/deviations: Step-through pattern;Decreased stride length   Gait velocity interpretation: at or above normal speed for age/gender General Gait Details: cues for posture, position in RW and shoulder depression   Stairs Stairs: Yes   Stair Management: Step to pattern;Forwards;One rail Right Number of Stairs: 7 General stair comments: cues for sequence with use of rail and good stability  Wheelchair Mobility    Modified Rankin (Stroke Patients Only)       Balance Overall balance assessment: No apparent balance deficits (not formally assessed)                                          Cognition Arousal/Alertness: Awake/alert Behavior During Therapy: WFL for tasks  assessed/performed Overall Cognitive Status: Within Functional Limits for tasks assessed                                        Exercises Total Joint Exercises  Hip ABduction/ADduction: AROM;Right;15 reps;Standing Long Arc Quad: AROM;Right;Seated;15 reps Knee Flexion: AROM;Right;Standing;15 reps Marching in Standing: AROM;Right;Standing;15 reps Standing Hip Extension: AROM;Right;Standing;15 reps    General Comments        Pertinent Vitals/Pain Pain Assessment: 0-10 Pain Score: 5  Pain Location: right hip after mobility Pain Descriptors / Indicators: Sore Pain Intervention(s): Limited activity within patient's tolerance;Repositioned    Home Living Family/patient expects to be discharged to:: Private residence Living Arrangements: Alone Available Help at Discharge: Family;Available 24 hours/day Type of Home: House Home Access: Stairs to enter   Home Layout: Multi-level Home Equipment: None      Prior Function Level of Independence: Independent          PT Goals (current goals can now be found in the care plan section) Acute Rehab PT Goals Patient Stated Goal: return to work (on disability x 2years) PT Goal Formulation: With patient Time For Goal Achievement: 09/01/17 Potential to Achieve Goals: Good Progress towards PT goals: Progressing toward goals    Frequency    7X/week      PT Plan Current plan remains appropriate    Co-evaluation  AM-PAC PT "6 Clicks" Daily Activity  Outcome Measure  Difficulty turning over in bed (including adjusting bedclothes, sheets and blankets)?: None Difficulty moving from lying on back to sitting on the side of the bed? : None Difficulty sitting down on and standing up from a chair with arms (e.g., wheelchair, bedside commode, etc,.)?: None Help needed moving to and from a bed to chair (including a wheelchair)?: A Little Help needed walking in hospital room?: A Little Help needed  climbing 3-5 steps with a railing? : None 6 Click Score: 22    End of Session Equipment Utilized During Treatment: Gait belt Activity Tolerance: Patient tolerated treatment well Patient left: in bed;with call bell/phone within reach Nurse Communication: Mobility status;Weight bearing status PT Visit Diagnosis: Other abnormalities of gait and mobility (R26.89)     Time: 1130-1147 PT Time Calculation (min) (ACUTE ONLY): 17 min  Charges:  $Gait Training: 8-22 mins                    G Codes:       Elwyn Reach, PT [phone]   [place] 08/25/2017, 11:50 AM

## 2017-08-25 NOTE — Evaluation (Signed)
Physical Therapy Evaluation Patient Details Name: Kenneth Long MRN: [id number] DOB: 1966-07-09 Today's Date: 08/25/2017   History of Present Illness  51 yo admitted for Right Anterior THA. PMHx: Asthma, CAD, HTN, sleep apnea  Clinical Impression  Pt pleasant and eager to mobilize. Pt educated for HEP, transfers, gait and progression. Pt with decreased strength, ROM, transfers and gait who will benefit from acute therapy to maximize mobility, function and gait to return pt to PLOF. Pt has family visiting from [place] staying with him as long as he needs.     Follow Up Recommendations DC plan and follow up therapy as arranged by surgeon    Equipment Recommendations  Rolling walker with 5" wheels    Recommendations for Other Services       Precautions / Restrictions Precautions Precautions: Fall Restrictions Weight Bearing Restrictions: Yes RLE Weight Bearing: Weight bearing as tolerated      Mobility  Bed Mobility Overal bed mobility: Modified Independent                Transfers Overall transfer level: Needs assistance   Transfers: Sit to/from Stand Sit to Stand: Min guard            Ambulation/Gait Ambulation/Gait assistance: Min guard Ambulation Distance (Feet): 500 Feet Assistive device: Rolling walker (2 wheeled) Gait Pattern/deviations: Step-through pattern;Decreased stride length;Trunk flexed   Gait velocity interpretation: Below normal speed for age/gender General Gait Details: cues for posture, position in RW and shoulder depression  Stairs            Wheelchair Mobility    Modified Rankin (Stroke Patients Only)       Balance Overall balance assessment: No apparent balance deficits (not formally assessed)                                           Pertinent Vitals/Pain Pain Assessment: 0-10 Pain Score: 5  Pain Location: right hip after mobility Pain Descriptors / Indicators: Sore Pain Intervention(s): Limited  activity within patient's tolerance;Repositioned;Premedicated before session    Home Living Family/patient expects to be discharged to:: Private residence Living Arrangements: Alone Available Help at Discharge: Family;Available 24 hours/day Type of Home: House Home Access: Stairs to enter   [employer] of Steps: 7 Home Layout: Multi-level Home Equipment: None      Prior Function Level of Independence: Independent               Hand Dominance        Extremity/Trunk Assessment   Upper Extremity Assessment Upper Extremity Assessment: Overall WFL for tasks assessed    Lower Extremity Assessment Lower Extremity Assessment: RLE deficits/detail RLE Deficits / Details: decreased ROM and strength as anticipated post op    Cervical / Trunk Assessment Cervical / Trunk Assessment: Normal  Communication   Communication: No difficulties  Cognition Arousal/Alertness: Awake/alert Behavior During Therapy: WFL for tasks assessed/performed Overall Cognitive Status: Within Functional Limits for tasks assessed                                        General Comments      Exercises Total Joint Exercises Heel Slides: AROM;Right;Supine;10 reps Hip ABduction/ADduction: AROM;Right;Supine;10 reps   Assessment/Plan    PT Assessment Patient needs continued PT services  PT Problem List Decreased strength;Decreased mobility;Decreased  activity tolerance;Decreased range of motion;Decreased knowledge of use of DME;Pain       PT Treatment Interventions Gait training;Therapeutic exercise;Patient/family education;Stair training;Functional mobility training;DME instruction;Therapeutic activities    PT Goals (Current goals can be found in the Care Plan section)  Acute Rehab PT Goals Patient Stated Goal: return to work (on disability x 2years) PT Goal Formulation: With patient Time For Goal Achievement: 09/01/17 Potential to Achieve Goals: Good    Frequency  7X/week   Barriers to discharge        Co-evaluation               AM-PAC PT "6 Clicks" Daily Activity  Outcome Measure Difficulty turning over in bed (including adjusting bedclothes, sheets and blankets)?: None Difficulty moving from lying on back to sitting on the side of the bed? : None Difficulty sitting down on and standing up from a chair with arms (e.g., wheelchair, bedside commode, etc,.)?: A Little Help needed moving to and from a bed to chair (including a wheelchair)?: A Little Help needed walking in hospital room?: A Little Help needed climbing 3-5 steps with a railing? : A Little 6 Click Score: 20    End of Session Equipment Utilized During Treatment: Gait belt Activity Tolerance: Patient tolerated treatment well Patient left: in chair;with call bell/phone within reach Nurse Communication: Mobility status;Weight bearing status PT Visit Diagnosis: Other abnormalities of gait and mobility (R26.89)    Time: 0932-6712 PT Time Calculation (min) (ACUTE ONLY): 19 min   Charges:   PT Evaluation $PT Eval Low Complexity: 1 Low     PT G Codes:        Elwyn Reach, PT [phone]   Aedyn Mckeon B Elizet Kaplan 08/25/2017, 9:03 AM

## 2017-08-25 NOTE — Care Management Note (Signed)
Case Management Note  Patient Details  Name: Kenneth Long MRN: [id number] Date of Birth: 1966/04/05  Subjective/Objective:   51 yr old gentleman s/p right total hip arthroplasty, anterior approach.                  Action/Plan: Case manager spoke with patient concerning discharge plan and DME needs. Patient is uninsured. Referral was called to Stevie Kern, [facility] Liaison. Patient says that his family is here from [place] to assist him with recovery. DME has been ordered.    Expected Discharge Date:     08/26/17            Expected Discharge Plan:  [place]  In-House Referral:  NA  Discharge planning Services  CM Consult  Post Acute Care Choice:  Home Health, Durable Medical Equipment Choice offered to:  Patient  DME Arranged:  3-N-1, Walker rolling DME Agency:  [place]:  PT [place]:  [place]  Status of Service:  Completed, signed off  If discussed at [place] of Stay Meetings, dates discussed:    Additional Comments:  Ninfa Meeker, RN 08/25/2017, 10:51 AM

## 2017-08-26 NOTE — Anesthesia Postprocedure Evaluation (Signed)
Anesthesia Post Note  Patient: Kenneth Long  Procedure(s) Performed: Procedure(s) (LRB): RIGHT TOTAL HIP ARTHROPLASTY ANTERIOR APPROACH (Right)     Patient location during evaluation: PACU Anesthesia Type: General Level of consciousness: sedated and patient cooperative Pain management: pain level controlled Vital Signs Assessment: post-procedure vital signs reviewed and stable Respiratory status: spontaneous breathing Cardiovascular status: stable Anesthetic complications: no    Last Vitals:  Vitals:   08/25/17 1148 08/25/17 1313  BP:  115/74  Pulse:  69  Resp:  16  Temp:  36.9 C  SpO2: 98% 98%    Last Pain:  Vitals:   08/25/17 1313  TempSrc: Oral  PainSc:    Pain Goal: Patients Stated Pain Goal: 3 (08/24/17 1730)               [name]

## 2017-09-06 ENCOUNTER — Ambulatory Visit (INDEPENDENT_AMBULATORY_CARE_PROVIDER_SITE_OTHER): Payer: Self-pay | Admitting: Orthopaedic Surgery

## 2017-09-06 ENCOUNTER — Ambulatory Visit (INDEPENDENT_AMBULATORY_CARE_PROVIDER_SITE_OTHER): Payer: No Typology Code available for payment source

## 2017-09-06 ENCOUNTER — Encounter (INDEPENDENT_AMBULATORY_CARE_PROVIDER_SITE_OTHER): Payer: Self-pay | Admitting: Orthopaedic Surgery

## 2017-09-06 DIAGNOSIS — M1611 Unilateral primary osteoarthritis, right hip: Secondary | ICD-10-CM

## 2017-09-06 MED ORDER — TRAMADOL HCL 50 MG PO TABS: 50.0000 mg | ORAL_TABLET | Freq: Two times a day (BID) | ORAL | 0 refills | Status: DC | PRN

## 2017-09-06 NOTE — Progress Notes (Signed)
Patient is 2 weeks status post right total hip replacement. He is doing well overall. He takes tramadol for pain. He was never contacted by [facility] for physical therapy.  He has been doing home exercises on his own. He endorses some tingling burning and numbness around the incision. Overall he is happy with his surgery.  Surgical incision is healed without any signs of infection. Mild postoperative swelling. Painless range of motion of the hip. Leg lengths are equal. X-ray show stable right total hip replacement.  Referral for [facility] and prescription for tramadol. Continue with aspirin for DVT prophylaxis. Follow-up in 4 weeks for recheck. Repeat standing AP pelvis xray at that time.

## 2017-09-13 ENCOUNTER — Other Ambulatory Visit (INDEPENDENT_AMBULATORY_CARE_PROVIDER_SITE_OTHER): Payer: Self-pay | Admitting: Orthopaedic Surgery

## 2017-09-27 ENCOUNTER — Other Ambulatory Visit (INDEPENDENT_AMBULATORY_CARE_PROVIDER_SITE_OTHER): Payer: Self-pay

## 2017-09-27 DIAGNOSIS — E785 Hyperlipidemia, unspecified: Secondary | ICD-10-CM

## 2017-09-28 LAB — LIPID PANEL WITH LDL/HDL RATIO
Cholesterol, Total: 143 mg/dL (ref 100–199)
HDL: 32 mg/dL — AB (ref >39)
TRIGLYCERIDES: 647 mg/dL — AB (ref 0–149)

## 2017-10-04 ENCOUNTER — Ambulatory Visit (INDEPENDENT_AMBULATORY_CARE_PROVIDER_SITE_OTHER): Payer: No Typology Code available for payment source | Admitting: Orthopaedic Surgery

## 2017-10-11 ENCOUNTER — Encounter: Payer: Self-pay | Admitting: Internal Medicine

## 2017-10-11 ENCOUNTER — Ambulatory Visit (INDEPENDENT_AMBULATORY_CARE_PROVIDER_SITE_OTHER): Payer: Self-pay | Admitting: Internal Medicine

## 2017-10-11 VITALS — BP 142/100 | HR 70 | Resp 12 | Ht 69.0 in | Wt 167.0 lb

## 2017-10-11 DIAGNOSIS — Z23 Encounter for immunization: Secondary | ICD-10-CM

## 2017-10-11 DIAGNOSIS — E785 Hyperlipidemia, unspecified: Secondary | ICD-10-CM

## 2017-10-11 DIAGNOSIS — Z96641 Presence of right artificial hip joint: Secondary | ICD-10-CM

## 2017-10-11 DIAGNOSIS — I2510 Atherosclerotic heart disease of native coronary artery without angina pectoris: Secondary | ICD-10-CM

## 2017-10-11 DIAGNOSIS — I10 Essential (primary) hypertension: Secondary | ICD-10-CM

## 2017-10-11 NOTE — Patient Instructions (Signed)
Get [facility] documents into clinic by Friday. We will make follow up after you get that in.

## 2017-10-11 NOTE — Progress Notes (Signed)
   Subjective:    Patient ID: Kenneth Long, male    DOB: 1966/11/04, 51 y.o.   MRN: [id number]  HPI   1.  HM:  Okay with flu vaccine after discussion  2.  Hyperlipidemia:  Is only taking fish oil once daily and never completed the paperwork for [employer].  Discussed his HDL continues at a very low level and his triglycerides are actually climbing.  He does not tolerate the fish taste with OTC fish oil.  He had his right total hip replacement on 08/24/2017 with Dr. Erlinda Hong of [facility].  He has not been able to get into PT, however.  On a waiting list per patient.  Patient would like to go to [facility] PT clinic [facility].    3.  CAD:  No chest pain.  No dyspnea.  Has had some leg swelling on right following hip replacement  4.  Hypertension:  Did not take bp meds yet this morning.  Current Meds  Medication Sig  . albuterol (PROVENTIL HFA;VENTOLIN HFA) 108 (90 Base) MCG/ACT inhaler Inhale 2 puffs into the lungs every 6 (six) hours as needed for wheezing or shortness of breath.  [name] amLODipine (NORVASC) 10 MG tablet Take 1 tablet (10 mg total) by mouth daily.  [name] aspirin EC 325 MG tablet Take 1 tablet (325 mg total) by mouth 2 (two) times daily. (Patient taking differently: Take 325 mg by mouth daily. )  . fexofenadine (ALLEGRA) 180 MG tablet Take 1 tablet (180 mg total) by mouth daily.  [name] lisinopril-hydrochlorothiazide (PRINZIDE,ZESTORETIC) 20-25 MG tablet Take 1 tablet by mouth daily.  . metoprolol (LOPRESSOR) 50 MG tablet Take 1 tablet (50 mg total) by mouth 2 (two) times daily.  . nitroGLYCERIN (NITROSTAT) 0.4 MG SL tablet Place 1 tablet (0.4 mg total) under the tongue every 5 (five) minutes as needed for chest pain.  [name] omega-3 acid ethyl esters (LOVAZA) 1 g capsule Take 2 capsules ( 2 grams ) twice a day (Patient taking differently: daily. 1 daily)    Allergies  Allergen Reactions  . Peanut-Containing Drug Products Anaphylaxis and Swelling  . Shellfish Allergy Anaphylaxis and  Swelling      Review of Systems     Objective:   Physical Exam NAD Lungs:  CTA CV:  RRR without murmur or rub.  Radial and DP pulses normal and equal Gait:  Pants falling down over hips and waddling at times.       Assessment & Plan:  1.  Hyperlipidemia with quite high Triglycerides:  Did not follow through with forms for Lov[employer].  Did not increase fish oil OTC to twice daily.  Discussed he is going the wrong way with his cholesterol  States he continues to take the atorvastatin 80 mg as well.  Lov[employer]K assistance program paperwork printed again.  Spoke with his sister in [place] to remind him to fill out forms and get back to clinic by this Friday when he returns to pay for influenza vaccine. No follow up planned until he gets the paperwork back.  2.  Hypertension:  To take meds regularly.  3.  Right hip replacement:  Referral to [facility] PT--will check in with Dr. Erlinda Hong to make sure he is okay with this.  Encouraged patient to wear his pants up higher to avoid an adverse gait after his hip replacement.  4.  HM:  Influenza vaccine today  4. CAD:  Stable.

## 2017-10-14 ENCOUNTER — Telehealth: Payer: Self-pay | Admitting: Internal Medicine

## 2017-10-14 NOTE — Telephone Encounter (Signed)
Patient came in today with Lov[employer]K application so Dr. Amil Amen can fill out the provider part and send over to [place]. Application will be given to Dr. Amil Amen today.  Made a copy of the application and gave original for patient to keep for his records.

## 2017-10-17 ENCOUNTER — Other Ambulatory Visit: Payer: Self-pay | Admitting: Internal Medicine

## 2017-10-17 MED ORDER — OMEGA-3-ACID ETHYL ESTERS 1 G PO CAPS
ORAL_CAPSULE | ORAL | 11 refills | Status: DC
Start: 2017-10-17 — End: 2018-04-10

## 2017-10-18 NOTE — Telephone Encounter (Signed)
Spoke with [name] and they no longer provide patient assistance for Lov[employer]

## 2017-10-21 NOTE — Telephone Encounter (Signed)
Call [facility] on Monday and see if they have any better digested fish oil supplements he could try as we cannot get the Lovaza and he is unable to tolerate OTC thus far as belches the fish taste

## 2017-10-24 ENCOUNTER — Ambulatory Visit: Payer: Self-pay | Admitting: Internal Medicine

## 2017-10-24 NOTE — Telephone Encounter (Signed)
Dr. Amil Amen will clarify with patient at appointment today

## 2017-10-25 ENCOUNTER — Ambulatory Visit: Payer: Self-pay | Admitting: Internal Medicine

## 2017-10-28 NOTE — Addendum Note (Signed)
Addendum  created 10/28/17 0946 by Rica Koyanagi, MD   Sign clinical note

## 2017-12-06 ENCOUNTER — Telehealth (INDEPENDENT_AMBULATORY_CARE_PROVIDER_SITE_OTHER): Payer: Self-pay | Admitting: Orthopaedic Surgery

## 2017-12-06 NOTE — Telephone Encounter (Signed)
Faxed to [facility] advising that we have no records within dates on their request 09/07/2017-present.

## 2017-12-07 ENCOUNTER — Telehealth: Payer: Self-pay | Admitting: Internal Medicine

## 2017-12-07 NOTE — Telephone Encounter (Signed)
Faxed to [facility] advising that we have no records within dates on their request 10/12/2017-present.

## 2017-12-24 NOTE — Progress Notes (Signed)
Cardiology Office Note   Date:  12/26/2017   ID:  Kenneth Long, DOB Apr 30, 1966, MRN [id number]  PCP:  Mack Hook, MD  Cardiologist:   Geoffrey Mankin Martinique, MD   Chief Complaint  Patient presents with  . Coronary Artery Disease  . Hypertension      History of Present Illness: Kenneth Long is a 52 y.o. male who presents for follow up CAD with STEMI. He was admitted in March 2017 with an inferior STEMI. He underwent cardiac cath which demonstrated occlusion of the second OM. Otherwise nonobstructive CAD. The OM was stented with a DES. EF by cath was low normal. Echo showed EF 40-45% without regional WMA.  He also has a history of HTN, asthma, and tobacco abuse.  Brilinta was stopped one year post MI.   He underwent right THR in September 4196 without complications. He is still sore and getting PT.  He is doing well from a cardiac standpoint.  He hasn't smoked since his heart attack. He is eating healthy. He was unable to get on gemfibrozil for elevated triglycerides since this wasn't covered by his pharmacy plan. He denies any chest pain or SOB.    Past Medical History:  Diagnosis Date  . Arthritis   . Asthma   . Coronary artery disease   . Dyspnea    "sometimes" with exertion   . Headache   . Heart murmur   . Hypertension   . Sleep apnea    no CPAP  . ST elevation (STEMI) myocardial infarction involving left circumflex coronary artery ([place]) 03/16/2016   DES CFX  . Umbilical hernia     Past Surgical History:  Procedure Laterality Date  . CARDIAC CATHETERIZATION N/A 03/16/2016   Procedure: Left Heart Cath and Coronary Angiography;  Surgeon: Desman Polak M Martinique, MD;  LAD OK, CFX 100%, OM1 20%, RCA 20%, EF nl  . CARDIAC CATHETERIZATION N/A 03/16/2016   Procedure: Coronary Stent Intervention;  Surgeon: Luie Laneve M Martinique, MD; PROMUS PREM MR 3.0X16 mm DES   . TOTAL HIP ARTHROPLASTY Right 08/24/2017   Procedure: RIGHT TOTAL HIP ARTHROPLASTY ANTERIOR APPROACH;  Surgeon:  Leandrew Koyanagi, MD;  Location: [facility];  Service: Orthopedics;  Laterality: Right;  RIGHT TOTAL HIP ARTHROPLASTY ANTERIOR APPROACH  . UMBILICAL HERNIA REPAIR  2229   umbilical hernia repair     Current Outpatient Medications  Medication Sig Dispense Refill  . albuterol (PROVENTIL HFA;VENTOLIN HFA) 108 (90 Base) MCG/ACT inhaler Inhale 2 puffs into the lungs every 6 (six) hours as needed for wheezing or shortness of breath. 1 Inhaler 0  . amLODipine (NORVASC) 10 MG tablet Take 1 tablet (10 mg total) by mouth daily. 30 tablet 11  . atorvastatin (LIPITOR) 80 MG tablet Take 80 mg by mouth daily.    . fexofenadine (ALLEGRA) 180 MG tablet Take 1 tablet (180 mg total) by mouth daily.    [name] lisinopril-hydrochlorothiazide (PRINZIDE,ZESTORETIC) 20-25 MG tablet Take 1 tablet by mouth daily. 30 tablet 6  . nitroGLYCERIN (NITROSTAT) 0.4 MG SL tablet Place 1 tablet (0.4 mg total) under the tongue every 5 (five) minutes as needed for chest pain. 25 tablet 12  . omega-3 acid ethyl esters (LOVAZA) 1 g capsule Take 2 capsules ( 2 grams ) twice a day 120 capsule 11  . aspirin EC 81 MG tablet Take 1 tablet (81 mg total) by mouth daily. 90 tablet 3  . carvedilol (COREG) 25 MG tablet Take 1 tablet (25 mg total) by mouth 2 (two)  times daily. 180 tablet 3   No current facility-administered medications for this visit.     Allergies:   Peanut-containing drug products and Shellfish allergy    Social History:  The patient  reports that he quit smoking about 21 months ago. His smoking use included cigarettes. he has never used smokeless tobacco. He reports that he drinks alcohol. He reports that he does not use drugs.   Family History:  The patient's family history includes Hypertension in his father, mother, sister, and sister; Sarcoidosis in his mother.    ROS:  Please see the history of present illness.   Otherwise, review of systems are positive for none.   All other systems are reviewed and negative.    PHYSICAL  EXAM: VS:  BP (!) 143/92   Pulse 75   Ht 5\' 10"  (1.778 m)   Wt 174 lb (78.9 kg)   SpO2 99%   BMI 24.97 kg/m  , BMI Body mass index is 24.97 kg/m. GENERAL:  Well appearing HEENT:  PERRL, EOMI, sclera are clear. Oropharynx is clear. NECK:  No jugular venous distention, carotid upstroke brisk and symmetric, no bruits, no thyromegaly or adenopathy LUNGS:  Clear to auscultation bilaterally CHEST:  Unremarkable HEART:  RRR,  PMI not displaced or sustained,S1 and S2 within normal limits, no S3, no S4: no clicks, no rubs, no murmurs ABD:  Soft, nontender. BS +, no masses or bruits. No hepatomegaly, no splenomegaly EXT:  2 + pulses throughout, no edema, no cyanosis no clubbing SKIN:  Warm and dry.  No rashes NEURO:  Alert and oriented x 3. Cranial nerves II through XII intact. PSYCH:  Cognitively intact     Recent Labs: 08/24/2017: ALT 69 08/25/2017: BUN 9; Creatinine, Ser 0.99; Hemoglobin 9.6; Platelets 276; Potassium 3.1; Sodium 140    Lipid Panel    Component Value Date/Time   CHOL 143 09/27/2017 0855   TRIG 647 (HH) 09/27/2017 0855   HDL 32 (L) 09/27/2017 0855   CHOLHDL 6.5 03/17/2016 0407   VLDL 76 (H) 03/17/2016 0407   LDLCALC Comment 09/27/2017 0855      Wt Readings from Last 3 Encounters:  12/26/17 174 lb (78.9 kg)  10/11/17 167 lb (75.8 kg)  08/16/17 161 lb 9.6 oz (73.3 kg)      Other studies Reviewed: Echo: 03/17/16: Study Conclusions  - Left ventricle: Posterior and lateral wall hypokinesis The cavity   size was moderately dilated. Systolic function was mildly to   moderately reduced. The estimated ejection fraction was in the   range of 40% to 45%. Wall motion was normal; there were no   regional wall motion abnormalities. - Mitral valve: There was mild regurgitation. - Left atrium: The atrium was mildly dilated. - Atrial septum: No defect or patent foramen ovale was identified.   Cardiac cath 03/16/16: Conclusion    Prox RCA to Mid RCA lesion, 20%  stenosed.  1st Mrg lesion, 20% stenosed.  The left ventricular systolic function is normal.  2nd Mrg lesion, 100% stenosed. Post intervention, there is a 0% residual stenosis.   1. Single vessel occlusive CAD 2. Low normal LV function 3. Successful stenting of the second OM with a DES  Plan: DAPT for one year. Patient may be a candidate for fast track discharge if no complications.       ASSESSMENT AND PLAN:  1. CAD s/p STEMI in March 2017 with DES of OM. Clinically doing very well.  Continue ASA, statin, beta blocker.  Follow up  in 6 months.  2. Hyperlipidemia- mixed. On high dose statin and Lopid. Triglycerides are still high. Focus on healthy eating and weight loss.  3. HTN consistently elevated. Recommend switching metoprolol to Coreg 25 mg bid to see if this will do better.   Disposition:   FU with me in 6 months  Signed, Man Bonneau Martinique, MD  12/26/2017 10:06 AM    [facility] [address] [place], [place], [address] Phone [phone], Fax [phone]

## 2017-12-26 ENCOUNTER — Encounter: Payer: Self-pay | Admitting: Cardiology

## 2017-12-26 ENCOUNTER — Ambulatory Visit: Payer: No Typology Code available for payment source | Admitting: Cardiology

## 2017-12-26 VITALS — BP 143/92 | HR 75 | Ht 70.0 in | Wt 174.0 lb

## 2017-12-26 DIAGNOSIS — I10 Essential (primary) hypertension: Secondary | ICD-10-CM

## 2017-12-26 DIAGNOSIS — I2510 Atherosclerotic heart disease of native coronary artery without angina pectoris: Secondary | ICD-10-CM

## 2017-12-26 DIAGNOSIS — E785 Hyperlipidemia, unspecified: Secondary | ICD-10-CM

## 2017-12-26 MED ORDER — CARVEDILOL 25 MG PO TABS: 25.0000 mg | ORAL_TABLET | Freq: Two times a day (BID) | ORAL | 3 refills | Status: DC

## 2017-12-26 MED ORDER — ASPIRIN EC 81 MG PO TBEC: 81.0000 mg | DELAYED_RELEASE_TABLET | Freq: Every day | ORAL | 3 refills | Status: DC

## 2017-12-26 NOTE — Patient Instructions (Signed)
Reduce ASA to 81 mg daily  We will switch metoprolol to Coreg 25 mg twice a day for blood pressure  Continue your other therapy  Try and keep weight down and eat healthy.

## 2018-04-10 ENCOUNTER — Ambulatory Visit: Payer: Self-pay | Admitting: Internal Medicine

## 2018-04-10 ENCOUNTER — Encounter: Payer: Self-pay | Admitting: Internal Medicine

## 2018-04-10 VITALS — BP 120/82 | HR 78 | Resp 12 | Ht 69.0 in | Wt 165.0 lb

## 2018-04-10 DIAGNOSIS — I10 Essential (primary) hypertension: Secondary | ICD-10-CM

## 2018-04-10 DIAGNOSIS — Z96641 Presence of right artificial hip joint: Secondary | ICD-10-CM

## 2018-04-10 DIAGNOSIS — E785 Hyperlipidemia, unspecified: Secondary | ICD-10-CM

## 2018-04-10 DIAGNOSIS — I2510 Atherosclerotic heart disease of native coronary artery without angina pectoris: Secondary | ICD-10-CM

## 2018-04-10 MED ORDER — FISH OIL 1000 MG PO CAPS: ORAL_CAPSULE | ORAL | 0 refills | Status: DC

## 2018-04-10 MED ORDER — LISINOPRIL-HYDROCHLOROTHIAZIDE 20-25 MG PO TABS: ORAL_TABLET | ORAL | 11 refills | Status: DC

## 2018-04-10 NOTE — Progress Notes (Signed)
Subjective:    Patient ID: Kenneth Long, male    DOB: 1966-05-13, 52 y.o.   MRN: [id number]  HPI   1.  Hyperlipidemia with high triglycerides: did not improve with Gemfibrozil back in May 2018.  Switched to high dose Atorvastatin and Lovaza, but [employer] no longer offers the medication assistance program, so has been on and off fish oil caps OTC.  Thinks these are 1000 mg each.   Has been putting his fish oil caps in the freezer and taking 1 cap daily. Taking this and Atorvastatin he believes since January.    2.  CAD:  Following with Dr. Martinique every 6 months.  No chest pain, just gas in chest.  If belches, feels better--when he gets the fish taste as well.  3.  OA of right hip:  Har no way to get to [facility] PT and the PT he was set up with through Ortho is closed now he thinks.    He was supposed to get home PT initially after his surgery, which never occurred.  He has never received regular outpatient PT as well.  Called and was speaking to triage nurse apparently at [facility] ortho, was disconnected.  When called back, could only leave a message about my concerns now 7 months post operatively without and PT and continued right hip pain.  3.  Essential Hypertension:  Switched to Carvedilol from Metoprolol and seems to have helped with bp control.   Continues also on Amlodipine. States taking Lisinopril/HCTZ daily, but has not been prescribed since 2017.   Does have some ankle swelling on days when he is on his feet a lot.  Limits sodium intake.  Current Meds  Medication Sig  . albuterol (PROVENTIL HFA;VENTOLIN HFA) 108 (90 Base) MCG/ACT inhaler Inhale 2 puffs into the lungs every 6 (six) hours as needed for wheezing or shortness of breath.  [name] amLODipine (NORVASC) 10 MG tablet Take 1 tablet (10 mg total) by mouth daily.  [name] aspirin EC 81 MG tablet Take 1 tablet (81 mg total) by mouth daily.  [name] atorvastatin (LIPITOR) 80 MG tablet Take 80 mg by mouth daily.  . carvedilol (COREG) 25  MG tablet Take 1 tablet (25 mg total) by mouth 2 (two) times daily.  . fexofenadine (ALLEGRA) 180 MG tablet Take 1 tablet (180 mg total) by mouth daily.  [name] lisinopril-hydrochlorothiazide (PRINZIDE,ZESTORETIC) 20-25 MG tablet Take 1 tablet by mouth daily.  . [DISCONTINUED] Omega-3 Fatty Acids (FISH OIL PO) Take by mouth daily.    Allergies  Allergen Reactions  . Peanut-Containing Drug Products Anaphylaxis and Swelling  . Shellfish Allergy Anaphylaxis and Swelling     Review of Systems     Objective:   Physical Exam Lungs:  CTA CV:  RRR with normal S1 and S2, No S3, S4 or murmur.  Radial and DP pulses normal and equal.  No ankle edema. Limps a bit, favoring left hip.       Assessment & Plan:  1.  Hyperlipidemia with high triglycerides:  Taking low dose fish oil at 1000 mg max daily.  Will have him go to [facility] and get 1000 mg to take twice daily When follows up in  3 months, will have FLP and CMP prior. Continue Atorvastatin.  2.  CAD:  Stable:  Working on improving risk factors still.  Continue ASA 81 mg daily.  3.  Essential Hypertension:  Check with [facility] as to whether he is taking Lisinopril/HCTZ daily as should have  run out sometime in 2018 based on last Rx in system.   To pay attention to sodium intake with intermittent ankle swelling.  4.  Right hip pain:  S/P right hip replacement 08/2017:  Call into Dr. Phoebe Sharps office to find out what is going on with PT.  Message left after disconnected during transfer of call.  Fasting labs in 3 months followed by CPE.

## 2018-04-10 NOTE — Patient Instructions (Signed)
[facility] [address]  [place], [place] [address] [phone]

## 2018-04-24 ENCOUNTER — Other Ambulatory Visit: Payer: Self-pay | Admitting: *Deleted

## 2018-04-24 ENCOUNTER — Other Ambulatory Visit: Payer: Self-pay

## 2018-04-24 DIAGNOSIS — I10 Essential (primary) hypertension: Secondary | ICD-10-CM

## 2018-04-24 MED ORDER — ATORVASTATIN CALCIUM 80 MG PO TABS: 80.0000 mg | ORAL_TABLET | Freq: Every day | ORAL | 7 refills | Status: DC

## 2018-04-24 MED ORDER — AMLODIPINE BESYLATE 10 MG PO TABS: 10.0000 mg | ORAL_TABLET | Freq: Every day | ORAL | 11 refills | Status: DC

## 2018-05-04 ENCOUNTER — Telehealth: Payer: Self-pay

## 2018-05-04 NOTE — Telephone Encounter (Signed)
Patient stopped in the office stating he needs a letter for DSS stating that he is not working so he can renew his food stamps. Patient states Dr. Amil Amen has done this for him in the past.  To Dr. Amil Amen for further direction.

## 2018-05-05 NOTE — Telephone Encounter (Signed)
Please let patient know that he really should obtain this letter now from his orthopedic surgeon, who is aware of where he is from a medical standpoint following his surgery and what he can and cannot do regarding employment. I previously wrote a note as he had not had the surgery yet.

## 2018-05-08 NOTE — Telephone Encounter (Signed)
Spoke with patient. Will contact ortho for letter

## 2018-06-14 ENCOUNTER — Ambulatory Visit (INDEPENDENT_AMBULATORY_CARE_PROVIDER_SITE_OTHER): Payer: Self-pay

## 2018-06-14 ENCOUNTER — Ambulatory Visit (INDEPENDENT_AMBULATORY_CARE_PROVIDER_SITE_OTHER): Payer: Self-pay | Admitting: Orthopaedic Surgery

## 2018-06-14 DIAGNOSIS — M25551 Pain in right hip: Secondary | ICD-10-CM

## 2018-06-14 MED ORDER — BUPIVACAINE HCL 0.5 % IJ SOLN
3.0000 mL | INTRAMUSCULAR | Status: AC | PRN
  Administered 2018-06-14: 3 mL via INTRA_ARTICULAR

## 2018-06-14 MED ORDER — METHYLPREDNISOLONE ACETATE 40 MG/ML IJ SUSP
40.0000 mg | INTRAMUSCULAR | Status: AC | PRN
  Administered 2018-06-14: 40 mg via INTRA_ARTICULAR

## 2018-06-14 MED ORDER — LIDOCAINE HCL 1 % IJ SOLN
3.0000 mL | INTRAMUSCULAR | Status: AC | PRN
  Administered 2018-06-14: 3 mL

## 2018-06-14 NOTE — Progress Notes (Signed)
Office Visit Note   Patient: Kenneth Long           Date of Birth: Sep 21, 1966           MRN: [id number] Visit Date: 06/14/2018              Requested by: Mack Hook, MD [place], [place] [id number] PCP: Mack Hook, MD   Assessment & Plan: Visit Diagnoses:  1. Pain in right hip     Plan: Impression is right hip trochanteric bursitis and adductor tendinitis.  I recommend physical therapy and home exercise program.  Trochanteric bursa injection was performed today.  Questions encouraged and answered.  Follow-up as needed.  Follow-Up Instructions: Return in about 1 year (around 06/15/2019).   Orders:  Orders Placed This Encounter  Procedures  . XR Pelvis 1-2 Views  . Ambulatory referral to Physical Therapy   No orders of the defined types were placed in this encounter.     Procedures: Large Joint Inj: R greater trochanter on 06/14/2018 3:06 PM Indications: pain Details: 22 G needle  Arthrogram: No  Medications: 3 mL lidocaine 1 %; 3 mL bupivacaine 0.5 %; 40 mg methylPREDNISolone acetate 40 MG/ML Patient was prepped and draped in the usual sterile fashion.       Clinical Data: No additional findings.   Subjective: Chief Complaint  Patient presents with  . Right Hip - Follow-up    Right total hip arthroplasty, anterior approach 08/24/17    Patient is a 52 year old gentleman who is 10 months status post right total hip replacement.  He is overall doing well.  He was not able to do physical therapy due to scheduling issues that will not exactly clear on.  He states that his old pain is gone but he has knee pain on the inner thigh and the lateral hip.  Denies any groin pain.   Review of Systems  Constitutional: Negative.   All other systems reviewed and are negative.    Objective: Vital Signs: There were no vitals taken for this visit.  Physical Exam  Constitutional: He is oriented to person, place, and time. He appears  well-developed and well-nourished.  Pulmonary/Chest: Effort normal.  Abdominal: Soft.  Neurological: He is alert and oriented to person, place, and time.  Skin: Skin is warm.  Psychiatric: He has a normal mood and affect. His behavior is normal. Judgment and thought content normal.  Nursing note and vitals reviewed.   Ortho Exam Right hip exam shows tenderness of the trochanteric bursa.  He does not have any significant pain with hip range of motion.  Tenderness of the abductor muscles. Specialty Comments:  No specialty comments available.  Imaging: Xr Pelvis 1-2 Views  Result Date: 06/14/2018 Stable right total hip replacement in good alignment.    PMFS History: Patient Active Problem List   Diagnosis Date Noted  . History of hip replacement 08/24/2017  . Primary osteoarthritis of right hip 07/11/2017  . Coronary artery disease 04/25/2017  . Seasonal allergies 04/25/2017  . Hyperlipidemia LDL goal <70 03/25/2016  . Cardiomyopathy, ischemic 03/25/2016  . ST elevation (STEMI) myocardial infarction involving left circumflex coronary artery ([place]) 03/16/2016  . Essential hypertension 03/09/2016  . Decreased visual acuity 03/09/2016  . Dental decay 03/09/2016   Past Medical History:  Diagnosis Date  . Arthritis   . Asthma   . Coronary artery disease   . Dyspnea    "sometimes" with exertion   . Headache   . Heart  murmur   . Hypertension   . Sleep apnea    no CPAP  . ST elevation (STEMI) myocardial infarction involving left circumflex coronary artery ([place]) 03/16/2016   DES CFX  . Umbilical hernia     Family History  Problem Relation Age of Onset  . Hypertension Mother   . Sarcoidosis Mother   . Hypertension Father   . Hypertension Sister   . Hypertension Sister     Past Surgical History:  Procedure Laterality Date  . CARDIAC CATHETERIZATION N/A 03/16/2016   Procedure: Left Heart Cath and Coronary Angiography;  Surgeon: Peter M Martinique, MD;  LAD OK, CFX 100%, OM1  20%, RCA 20%, EF nl  . CARDIAC CATHETERIZATION N/A 03/16/2016   Procedure: Coronary Stent Intervention;  Surgeon: Peter M Martinique, MD; PROMUS PREM MR 3.0X16 mm DES   . TOTAL HIP ARTHROPLASTY Right 08/24/2017   Procedure: RIGHT TOTAL HIP ARTHROPLASTY ANTERIOR APPROACH;  Surgeon: Leandrew Koyanagi, MD;  Location: [facility];  Service: Orthopedics;  Laterality: Right;  RIGHT TOTAL HIP ARTHROPLASTY ANTERIOR APPROACH  . UMBILICAL HERNIA REPAIR  9935   umbilical hernia repair   Social History   Occupational History  . Occupation: Previously supervised Solicitor at [employer]  . Smoking status: Former Smoker    Types: Cigarettes    Last attempt to quit: 03/18/2016    Years since quitting: 2.2  . Smokeless tobacco: Never Used  Substance and Sexual Activity  . Alcohol use: Yes    Alcohol/week: 0.0 oz    Comment: 2 beers every few days  . Drug use: No  . Sexual activity: Not on file

## 2018-07-04 ENCOUNTER — Ambulatory Visit: Payer: No Typology Code available for payment source | Admitting: Physical Therapy

## 2018-07-10 ENCOUNTER — Other Ambulatory Visit: Payer: Self-pay

## 2018-07-10 DIAGNOSIS — E785 Hyperlipidemia, unspecified: Secondary | ICD-10-CM

## 2018-07-10 DIAGNOSIS — R972 Elevated prostate specific antigen [PSA]: Secondary | ICD-10-CM

## 2018-07-10 DIAGNOSIS — N400 Enlarged prostate without lower urinary tract symptoms: Secondary | ICD-10-CM

## 2018-07-10 DIAGNOSIS — Z79899 Other long term (current) drug therapy: Secondary | ICD-10-CM

## 2018-07-11 ENCOUNTER — Other Ambulatory Visit: Payer: Self-pay

## 2018-07-11 LAB — COMPREHENSIVE METABOLIC PANEL
ALT: 28 IU/L (ref 0–44)
AST: 25 IU/L (ref 0–40)
Albumin/Globulin Ratio: 1.6 (ref 1.2–2.2)
Albumin: 4.7 g/dL (ref 3.5–5.5)
Alkaline Phosphatase: 75 IU/L (ref 39–117)
BUN/Creatinine Ratio: 10 (ref 9–20)
BUN: 11 mg/dL (ref 6–24)
Bilirubin Total: 0.2 mg/dL (ref 0.0–1.2)
CALCIUM: 9.3 mg/dL (ref 8.7–10.2)
CO2: 20 mmol/L (ref 20–29)
Chloride: 105 mmol/L (ref 96–106)
Creatinine, Ser: 1.07 mg/dL (ref 0.76–1.27)
GFR calc Af Amer: 93 mL/min/{1.73_m2} (ref >59)
GFR, EST NON AFRICAN AMERICAN: 81 mL/min/{1.73_m2} (ref >59)
Globulin, Total: 2.9 g/dL (ref 1.5–4.5)
Glucose: 82 mg/dL (ref 65–99)
Potassium: 3.5 mmol/L (ref 3.5–5.2)
Sodium: 144 mmol/L (ref 134–144)
TOTAL PROTEIN: 7.6 g/dL (ref 6.0–8.5)

## 2018-07-11 LAB — LIPID PANEL W/O CHOL/HDL RATIO
Cholesterol, Total: 149 mg/dL (ref 100–199)
HDL: 32 mg/dL — AB (ref >39)
Triglycerides: 705 mg/dL (ref 0–149)

## 2018-07-11 LAB — PSA: Prostate Specific Ag, Serum: 0.3 ng/mL (ref 0.0–4.0)

## 2018-07-14 ENCOUNTER — Ambulatory Visit: Payer: Self-pay | Admitting: Internal Medicine

## 2018-07-14 ENCOUNTER — Encounter: Payer: Self-pay | Admitting: Internal Medicine

## 2018-07-14 VITALS — BP 142/90 | HR 86 | Resp 12 | Ht 69.0 in | Wt 169.0 lb

## 2018-07-14 DIAGNOSIS — N390 Urinary tract infection, site not specified: Secondary | ICD-10-CM

## 2018-07-14 DIAGNOSIS — I10 Essential (primary) hypertension: Secondary | ICD-10-CM

## 2018-07-14 DIAGNOSIS — I2510 Atherosclerotic heart disease of native coronary artery without angina pectoris: Secondary | ICD-10-CM

## 2018-07-14 DIAGNOSIS — K029 Dental caries, unspecified: Secondary | ICD-10-CM

## 2018-07-14 DIAGNOSIS — R079 Chest pain, unspecified: Secondary | ICD-10-CM

## 2018-07-14 DIAGNOSIS — F439 Reaction to severe stress, unspecified: Secondary | ICD-10-CM

## 2018-07-14 DIAGNOSIS — Z7251 High risk heterosexual behavior: Secondary | ICD-10-CM

## 2018-07-14 DIAGNOSIS — N3943 Post-void dribbling: Secondary | ICD-10-CM

## 2018-07-14 DIAGNOSIS — N401 Enlarged prostate with lower urinary tract symptoms: Secondary | ICD-10-CM

## 2018-07-14 DIAGNOSIS — Z0000 Encounter for general adult medical examination without abnormal findings: Secondary | ICD-10-CM

## 2018-07-14 DIAGNOSIS — R319 Hematuria, unspecified: Secondary | ICD-10-CM

## 2018-07-14 DIAGNOSIS — E785 Hyperlipidemia, unspecified: Secondary | ICD-10-CM

## 2018-07-14 LAB — POCT URINALYSIS DIPSTICK
Bilirubin, UA: NEGATIVE
Glucose, UA: NEGATIVE
Ketones, UA: NEGATIVE
Nitrite, UA: POSITIVE
Protein, UA: NEGATIVE
Spec Grav, UA: 1.015 (ref 1.010–1.025)
Urobilinogen, UA: 0.2 E.U./dL
pH, UA: 5 (ref 5.0–8.0)

## 2018-07-14 MED ORDER — NITROGLYCERIN 0.4 MG SL SUBL: 0.4000 mg | SUBLINGUAL_TABLET | SUBLINGUAL | 12 refills | Status: DC | PRN

## 2018-07-14 MED ORDER — FISH OIL 1000 MG PO CAPS: ORAL_CAPSULE | ORAL | 0 refills | Status: DC

## 2018-07-14 MED ORDER — CETIRIZINE HCL 10 MG PO TABS: 10.0000 mg | ORAL_TABLET | Freq: Every day | ORAL | 11 refills | Status: DC

## 2018-07-14 MED ORDER — DOXYCYCLINE HYCLATE 100 MG PO TABS: ORAL_TABLET | ORAL | 0 refills | Status: DC

## 2018-07-14 MED ORDER — MOMETASONE FUROATE 50 MCG/ACT NA SUSP: NASAL | 12 refills | Status: DC

## 2018-07-14 MED ORDER — TAMSULOSIN HCL 0.4 MG PO CAPS: ORAL_CAPSULE | ORAL | 11 refills | Status: DC

## 2018-07-14 MED ORDER — CEFTRIAXONE SODIUM 250 MG IJ SOLR
250.0000 mg | Freq: Once | INTRAMUSCULAR | Status: AC
Start: 2018-07-14 — End: 2018-07-14
  Administered 2018-07-14: 250 mg via INTRAMUSCULAR

## 2018-07-14 NOTE — Progress Notes (Signed)
Subjective:    Patient ID: Kenneth Long, male    DOB: [birth year], 52 y.o.   MRN: [id number]  HPI   Here for Male CPE:  1.  STE:  Has he believes a right inguinal hernia.  He does probably check in shower once monthly or so.  No family history of testicular cancer.  2.  PSA/DRE:  PSA recently at 0.3, normal.  Has never had DRE per patient.  No history of prostate cancer.  Having some difficulties with urine flow since beginning of year.  May be a bit worse since having intercourse a couple of weeks ago with a new male partner.  3.  Guaiac Cards:  Never.  No definite family history of colon cancer.  4.  Colonoscopy: Never.  5.  Cholesterol/Glucose:  Fasting glucose 07/10/18 was 82.  Lipids still not at goal with triglycerides high and HDL low.  Physical activity limited by hip.  Lipid Panel     Component Value Date/Time   CHOL 149 07/10/2018 0911   TRIG 705 (HH) 07/10/2018 0911   HDL 32 (L) 07/10/2018 0911   CHOLHDL 6.5 03/17/2016 0407   VLDL 76 (H) 03/17/2016 0407   LDLCALC Comment 07/10/2018 0911    6.  Immunizations:  Immunization History  Administered Date(s) Administered  . DTaP 04/04/1979  . IPV 04/04/1979  . Influenza Inj Mdck Quad Pf 10/11/2017  . MMR 04/04/1979  . Tdap 07/27/2016    Hypertension:  States took bp med late today.  Hyperlipidemia:  Recent lipids without improvement.  Has not missed meds.  States has been taking fish oil 1000 mg twice daily. Was to just start PT through ortho, but very expensive.  Difficulty getting physical activity increase with hip pain issues following replacement.  States now with arthritis about the replacement.  CAD:  Started having chest pain about 3 weeks ago.  Gets this usually when outside walking when really hot recently.  If he sits down and rest, resolves.  Has not tried NTG SL as he never has with him with pain.  Feels like indigestion, which is what he felt with his MI, the latter was much more severe and did  not go away. He has contacted Cardiology 3 days ago and waiting to hear back about being worked in.  Was not to be seen by cardiology until September.  Current Meds  Medication Sig  . albuterol (PROVENTIL HFA;VENTOLIN HFA) 108 (90 Base) MCG/ACT inhaler Inhale 2 puffs into the lungs every 6 (six) hours as needed for wheezing or shortness of breath.  [name] amLODipine (NORVASC) 10 MG tablet Take 1 tablet (10 mg total) by mouth daily.  [name] aspirin EC 81 MG tablet Take 1 tablet (81 mg total) by mouth daily.  [name] atorvastatin (LIPITOR) 80 MG tablet Take 1 tablet (80 mg total) by mouth daily.  . carvedilol (COREG) 25 MG tablet Take 1 tablet (25 mg total) by mouth 2 (two) times daily.  . cetirizine (ZYRTEC) 10 MG tablet Take 1 tablet (10 mg total) by mouth daily.  [name] lisinopril-hydrochlorothiazide (PRINZIDE,ZESTORETIC) 20-25 MG tablet 1 tab by mouth daily  . nitroGLYCERIN (NITROSTAT) 0.4 MG SL tablet Place 1 tablet (0.4 mg total) under the tongue every 5 (five) minutes as needed for chest pain.  . Omega-3 Fatty Acids (FISH OIL) 1000 MG CAPS 1000 mg twice daily  . [DISCONTINUED] cetirizine (ZYRTEC) 10 MG tablet Take 10 mg by mouth daily.  . [DISCONTINUED] fexofenadine (ALLEGRA) 180 MG tablet Take 1 tablet (  180 mg total) by mouth daily.  . [DISCONTINUED] nitroGLYCERIN (NITROSTAT) 0.4 MG SL tablet Place 1 tablet (0.4 mg total) under the tongue every 5 (five) minutes as needed for chest pain.  . [DISCONTINUED] nitroGLYCERIN (NITROSTAT) 0.4 MG SL tablet Place 1 tablet (0.4 mg total) under the tongue every 5 (five) minutes as needed for chest pain.  . [DISCONTINUED] Omega-3 Fatty Acids (FISH OIL) 1000 MG CAPS 1000 mg twice daily    Allergies  Allergen Reactions  . Peanut-Containing Drug Products Anaphylaxis and Swelling  . Shellfish Allergy Anaphylaxis and Swelling    Past Medical History:  Diagnosis Date  . Arthritis   . Asthma   . Coronary artery disease   . Dyspnea    "sometimes" with exertion   .  Headache   . Heart murmur   . Hypertension   . Sleep apnea    no CPAP  . ST elevation (STEMI) myocardial infarction involving left circumflex coronary artery ([place]) 03/16/2016   DES CFX  . Umbilical hernia     Past Surgical History:  Procedure Laterality Date  . CARDIAC CATHETERIZATION N/A 03/16/2016   Procedure: Left Heart Cath and Coronary Angiography;  Surgeon: Peter M Martinique, MD;  LAD OK, CFX 100%, OM1 20%, RCA 20%, EF nl  . CARDIAC CATHETERIZATION N/A 03/16/2016   Procedure: Coronary Stent Intervention;  Surgeon: Peter M Martinique, MD; PROMUS PREM MR 3.0X16 mm DES   . TOTAL HIP ARTHROPLASTY Right 08/24/2017   Procedure: RIGHT TOTAL HIP ARTHROPLASTY ANTERIOR APPROACH;  Surgeon: Leandrew Koyanagi, MD;  Location: [facility];  Service: Orthopedics;  Laterality: Right;  RIGHT TOTAL HIP ARTHROPLASTY ANTERIOR APPROACH  . UMBILICAL HERNIA REPAIR  6578   umbilical hernia repair   Social History   Socioeconomic History  . Marital status: Single    Spouse name: Not on file  . Number of children: 2  . Years of education: Not on file  . Highest education level: Not on file  Occupational History  . Occupation: Previously supervised Solicitor at [employer]  . Financial resource strain: Not on file  . Food insecurity:    Worry: Not on file    Inability: Not on file  . Transportation needs:    Medical: Not on file    Non-medical: Not on file  Tobacco Use  . Smoking status: Former Smoker    Types: Cigarettes    Last attempt to quit: 03/18/2016    Years since quitting: 2.3  . Smokeless tobacco: Never Used  Substance and Sexual Activity  . Alcohol use: Yes    Alcohol/week: 0.0 oz    Comment: 2 beers every few days  . Drug use: No  . Sexual activity: Yes    Comment: since 2019, problems with decreased erection.  Lifestyle  . Physical activity:    Days per week: Not on file    Minutes per session: Not on file  . Stress: Not on file  Relationships  . Social  connections:    Talks on phone: Not on file    Gets together: Not on file    Attends religious service: Not on file    Active member of club or organization: Not on file    Attends meetings of clubs or organizations: Not on file    Relationship status: Not on file  . Intimate partner violence:    Fear of current or ex partner: Not on file    Emotionally abused: Not on file  Physically abused: Not on file    Forced sexual activity: Not on file  Other Topics Concern  . Not on file  Social History Narrative   Lives by himself   Unemployed due to hip problems   Born and raised in [place] since early 1980s.   Family History  Problem Relation Age of Onset  . Hypertension Mother   . Sarcoidosis Mother   . Hypertension Father   . Hypertension Sister   . Hypertension Sister      Review of Systems  Constitutional: Positive for fatigue (since MI--no change). Negative for appetite change.  HENT: Positive for dental problem (losing many teeth.  ), rhinorrhea (Has corticosteroid nasal spray of his mother's--does not use.), sinus pressure and sneezing. Negative for ear pain, hearing loss and sore throat.   Eyes: Positive for visual disturbance (Reading glasses).  Respiratory: Positive for shortness of breath (With recent indigestion symptoms).   Cardiovascular: Positive for chest pain (See HPI and indigestion) and leg swelling (when up on his feet more so.  Comes and goes.  Not every day.  Bilateral).       No definite PND or orthopnea.  Sleeps on 2 pillows, but has done so since a child.  Gastrointestinal: Positive for diarrhea (Frequent). Negative for abdominal pain, blood in stool (No melena), nausea and vomiting.  Genitourinary: Positive for decreased urine volume. Negative for discharge and dysuria.       Urinary hesitancy and difficulties fully emptying.  For past 2 months.  Musculoskeletal: Positive for arthralgias (knee and shoulders with pain  at times).  Skin: Negative for rash.  Neurological: Positive for dizziness (when goes out when hot outside). Negative for weakness and numbness.  Psychiatric/Behavioral: Negative for dysphoric mood. The patient is nervous/anxious.        Objective:   Physical Exam  Constitutional: He is oriented to person, place, and time. He appears well-developed and well-nourished.  HENT:  Head: Normocephalic and atraumatic.  Right Ear: Hearing, tympanic membrane, external ear and ear canal normal.  Left Ear: Hearing, tympanic membrane, external ear and ear canal normal.  Nose: Mucosal edema and rhinorrhea present.  Mouth/Throat: Uvula is midline, oropharynx is clear and moist and mucous membranes are normal.  Multiple teeth missing.  Significant gingival recession  Eyes: Pupils are equal, round, and reactive to light. Conjunctivae and EOM are normal.  Discs sharp bilaterally  Neck: Normal range of motion and full passive range of motion without pain. Neck supple. No thyroid mass and no thyromegaly present.  Cardiovascular: Normal rate, regular rhythm, S1 normal and S2 normal. Exam reveals no S3, no S4 and no friction rub.  No murmur heard. No carotid bruits.  Carotid, radial, femoral, DP and PT pulses normal and equal.   Pulmonary/Chest: Effort normal and breath sounds normal.  Abdominal: Soft. Bowel sounds are normal. He exhibits no mass. There is no hepatosplenomegaly. There is no tenderness. No hernia. Hernia confirmed negative in the right inguinal area and confirmed negative in the left inguinal area.  Genitourinary: Rectum normal and penis normal. Rectal exam shows no mass and guaiac negative stool. Prostate is enlarged (a bit boggy, but nontender per patient). Right testis shows no mass, no swelling and no tenderness. Right testis is descended. Left testis shows no mass, no swelling and no tenderness. Left testis is descended. Circumcised.  Musculoskeletal: Normal range of motion.    Lymphadenopathy:       Head (right side): No submental  and no submandibular adenopathy present.       Head (left side): No submental and no submandibular adenopathy present.    He has no cervical adenopathy.    He has no axillary adenopathy.       Right: No inguinal and no supraclavicular adenopathy present.       Left: No inguinal and no supraclavicular adenopathy present.  Neurological: He is alert and oriented to person, place, and time. He has normal strength and normal reflexes. No cranial nerve deficit or sensory deficit. Coordination and gait normal.  Skin: Skin is warm, dry and intact. Capillary refill takes less than 2 seconds. No rash noted.  Psychiatric: He has a normal mood and affect. His behavior is normal. Thought content normal.         ECG:  NSR elevated J point.  No significant change from 05/2017 ECG Assessment & Plan:  1.  CPE Recommend influenza vaccine in September--to call for dates. He likely will not do so as continues to feel it makes him ill. PSA normal recently.  2.  Indigestion/CP with exertion:  Similar to when had MI in past, though not as severe. Reminded him to carry SL NTG with him and use it. Waiting for work in with Cardiology ECG does not show new changes to suggest ongoing ischemia or recent injury.  3.  Essential Hypertension:  No improvement with recheck.  Adding Flomax, which may help a bit.  Quite anxious today as well with GU complaints, which may explain increased bp. Recheck with UA in 2 weeks.  4.  BPH:  Symptoms started well before recent intercourse with new partner.  Start Flomax at bedtime 0.4 mg..  5.  Decrease urine caliber, incomplete emptying, etc with abnormal UA:  + blood, leuks and nitrites:  Some related to #4,but treating for prostatitis and treat for GC/chlamydia.  Ceftriaxone 250 mg IM and Doxycycline 100 mg 2 times daily for 10 days.  Repeat UA in 2 weeks. Urine for culture and GC/chlamydia. May need to treat for  trichomonas or chronic prostatitis if UA remains abnormal/consider imaging of urinary system. Addendum:  Urine culture returned positive for E coli.  Sensitive to Tetracycline, so if he has picked up Doxycycline, will have him complete the 10 day course and then switch to Cipro 500 mg twice daily for 32 days.  If he has not, will complete a 6 week course of the Cipro only.  6.  Right hip pain and disability:  Recommended reconsidering [facility] for cost and just paying for uber to get there.  He will let me know.   Needs letter for disability.  7.  Hyperlipidemia with very high trigs and low HDL: increase fish oil to 4 g total daily. Will recheck in 3 months with follow up  8.  Allergies:  Nasonex added.

## 2018-07-14 NOTE — Patient Instructions (Signed)
[facility] [address]  [place], [place] [address] [phone]  See if can increase fish oil to 2000 mg twice daily.

## 2018-07-16 DIAGNOSIS — F439 Reaction to severe stress, unspecified: Secondary | ICD-10-CM | POA: Insufficient documentation

## 2018-07-16 LAB — GC/CHLAMYDIA PROBE AMP
CHLAMYDIA, DNA PROBE: NEGATIVE
Neisseria gonorrhoeae by PCR: NEGATIVE

## 2018-07-16 LAB — URINE CULTURE

## 2018-07-17 ENCOUNTER — Ambulatory Visit: Payer: No Typology Code available for payment source | Attending: Orthopaedic Surgery | Admitting: Physical Therapy

## 2018-07-17 ENCOUNTER — Encounter: Payer: Self-pay | Admitting: Physical Therapy

## 2018-07-17 ENCOUNTER — Other Ambulatory Visit: Payer: Self-pay

## 2018-07-17 DIAGNOSIS — M6281 Muscle weakness (generalized): Secondary | ICD-10-CM

## 2018-07-17 DIAGNOSIS — R2689 Other abnormalities of gait and mobility: Secondary | ICD-10-CM

## 2018-07-17 DIAGNOSIS — M25551 Pain in right hip: Secondary | ICD-10-CM

## 2018-07-17 NOTE — Therapy (Signed)
[place], [place], [address] Phone: [phone]   Fax:  [phone]  Physical Therapy Evaluation  Patient Details  Name: Kenneth Long MRN: [id number] Date of Birth: 1966-09-15 Referring Provider: Leandrew Koyanagi, MD   Encounter Date: 07/17/2018  PT End of Session - 07/17/18 1551    Visit Number  1    Number of Visits  13    Date for PT Re-Evaluation  08/28/18    PT Start Time  2353    PT Stop Time  1632    PT Time Calculation (min)  48 min    Activity Tolerance  Patient tolerated treatment well    Behavior During Therapy  [facility] for tasks assessed/performed       Past Medical History:  Diagnosis Date  . Arthritis   . Asthma   . Coronary artery disease   . Dyspnea    "sometimes" with exertion   . Headache   . Heart murmur   . Hypertension   . Sleep apnea    no CPAP  . ST elevation (STEMI) myocardial infarction involving left circumflex coronary artery ([place]) 03/16/2016   DES CFX  . Umbilical hernia     Past Surgical History:  Procedure Laterality Date  . CARDIAC CATHETERIZATION N/A 03/16/2016   Procedure: Left Heart Cath and Coronary Angiography;  Surgeon: Peter M Martinique, MD;  LAD OK, CFX 100%, OM1 20%, RCA 20%, EF nl  . CARDIAC CATHETERIZATION N/A 03/16/2016   Procedure: Coronary Stent Intervention;  Surgeon: Peter M Martinique, MD; PROMUS PREM MR 3.0X16 mm DES   . TOTAL HIP ARTHROPLASTY Right 08/24/2017   Procedure: RIGHT TOTAL HIP ARTHROPLASTY ANTERIOR APPROACH;  Surgeon: Leandrew Koyanagi, MD;  Location: [facility];  Service: Orthopedics;  Laterality: Right;  RIGHT TOTAL HIP ARTHROPLASTY ANTERIOR APPROACH  . UMBILICAL HERNIA REPAIR  6144   umbilical hernia repair    There were no vitals filed for this visit.   Subjective Assessment - 07/17/18 1553    Subjective  Pt reports that he is having R hip pain. The physician gave him an injection last week, but says he is developing arthritis because he  hasn't been working out. He hasn't been working out because of his heart attack (2 years ago). He had a R THA in September 2018 and has had pain ever since, but the pain is different. Pain bothers him the most at night and if he sits in the car for too long.     Limitations  Sitting;Lifting;Standing;Walking    How long can you sit comfortably?  45 min    How long can you stand comfortably?  20 min    How long can you walk comfortably?  30 min    Diagnostic tests  x-ray    Patient Stated Goals  get back to playing basketball and working    Currently in Pain?  Yes    Pain Score  7  pain is worst at night 9/10    Pain Location  Hip    Pain Orientation  Right;Lateral    Pain Descriptors / Indicators  Discomfort    Pain Radiating Towards  travel to R foot    Pain Onset  More than a month ago a month or two after surgery    Pain Frequency  Constant    Aggravating Factors   walking, standing, sitting, can't lay on one side for too long    Pain Relieving Factors  soak in  bathtub, injection (only lasted for a couple of hours)         [facility] PT Assessment - 07/17/18 0001      Assessment   Medical Diagnosis  R hip pain    Referring Provider  Leandrew Koyanagi, MD    Onset Date/Surgical Date  -- october or november 2018    Hand Dominance  Right    Next MD Visit  -- august 2019    Prior Therapy  none      Precautions   Precautions  None      Restrictions   Weight Bearing Restrictions  No      Balance Screen   Has the patient fallen in the past 6 months  Yes    How many times?  1    Has the patient had a decrease in activity level because of a fear of falling?   No    Is the patient reluctant to leave their home because of a fear of falling?   No      Home Environment   Living Environment  Private residence    Living Arrangements  Alone    Type of [place] Access  Stairs to enter    Entrance Stairs-Number of Steps  6    Entrance Stairs-Rails  Right    Home Layout  Two level     Alternate Level Stairs-Number of Steps  8    Alternate Level Stairs-Rails  Right    Home Equipment  Walker - 2 wheels;Crutches      Prior Function   Level of Independence  Independent    Vocation  On disability in september    Leisure  basketball      Cognition   Overall Cognitive Status  Within Functional Limits for tasks assessed      Observation/Other Assessments   Focus on Therapeutic Outcomes (FOTO)   69% limited 45% limited      Posture/Postural Control   Posture/Postural Control  Postural limitations    Postural Limitations  Rounded Shoulders;Forward head      ROM / Strength   AROM / PROM / Strength  AROM;Strength      AROM   AROM Assessment Site  Hip    Right/Left Hip  Right;Left    Right Hip Extension  5 painful    Right Hip Flexion  55    Right Hip External Rotation   35 painful    Right Hip Internal Rotation   41 painful    Right Hip ABduction  11 painful    Left Hip Extension  5    Left Hip Flexion  91    Left Hip External Rotation   40    Left Hip Internal Rotation   41    Left Hip ABduction  25      Strength   Strength Assessment Site  Hip    Right/Left Hip  Right;Left    Right Hip Flexion  3/5    Right Hip Extension  3-/5    Right Hip External Rotation   3-/5    Right Hip Internal Rotation  3-/5    Right Hip ABduction  3-/5    Left Hip Flexion  5/5    Left Hip Extension  3+/5    Left Hip External Rotation  3+/5    Left Hip Internal Rotation  3+/5    Left Hip ABduction  3+/5      Palpation   Palpation  comment  TTP R greater trochanter, ITB, proximal R adductors, R piriformis, mild ttp R glute med      Ober's Test   Findings  Positive    Side  Right    Comments  mild ITB tightness on R; painful       Ambulation/Gait   Gait Pattern  Step-through pattern;Antalgic;Trendelenburg;Decreased hip/knee flexion - right                Objective measurements completed on examination: See above findings.              PT Education  - 07/17/18 1634    Education Details  examination findings, HEP, POC    Person(s) Educated  Patient    Methods  Explanation;Handout    Comprehension  Verbalized understanding       PT Short Term Goals - 07/17/18 1652      PT SHORT TERM GOAL #1   Title  Pt will be I with HEP    Time  3    Period  Weeks    Status  New    Target Date  08/07/18      PT SHORT TERM GOAL #2   Title  Pt will verbalize techniques to decrease pain in hip in order to increase quality of life    Time  3    Period  Weeks    Status  New    Target Date  08/07/18        PT Long Term Goals - 07/17/18 1654      PT LONG TERM GOAL #1   Title  Pt will demonstrate increased R hip strength >/= 4/5 to decrease pain to </= 2/10 and improve functional mobility    Time  6    Period  Weeks    Status  New    Target Date  08/28/18      PT LONG TERM GOAL #2   Title  Pt will increase R hip flexion AROM to >/= 90 degrees and R hip extension AROM to >/= 15 degree to improve functional mobility     Time  6    Period  Weeks    Status  New    Target Date  08/28/18      PT LONG TERM GOAL #3   Title  Pt will decrease FOTO score to </= 45% limited to improve function and quality of life    Time  6    Period  Weeks    Status  New    Target Date  08/28/18      PT LONG TERM GOAL #4   Title  Pt will be able to stand and walk >/= 60 minutes to meet pt's goals of returning to work and playing basketball    Time  6    Period  Weeks    Status  New    Target Date  08/28/18             Plan - 07/17/18 1548    Clinical Impression Statement  Pt is a 53 yo male who is referred to physical therapy for R hip pain including R hip trochanteric bursitis and adductor tendinitis. He has a history of SOB, HTN, and an MI. Pt reports he has been experiencing the pain since a few months following his R THA (sept 2018). Upon evaluation, pt is ttp over his R greater trochanter, ITB, piriformis, and proximal adductors. He  demonstrates significant decreases in hip strength bilaterally (  R>L). He has decreased AROM in his R hip. Pt would benefit from OPPT services to address pain, ROM, strength, posture, gait abnormalities, and overall functional mobility.     History and Personal Factors relevant to plan of care:  Asthma, SOB, CAD, h/o MI, HTN, R THA (sept 2018)    Clinical Presentation  Evolving    Clinical Presentation due to:  dec AROM, dec strength, increased pain, abnormal posture    Clinical Decision Making  Moderate    Rehab Potential  Good    PT Frequency  2x / week    PT Duration  6 weeks    PT Treatment/Interventions  ADLs/Self Care Home Management;Cryotherapy;Electrical Stimulation;Iontophoresis 4mg /ml Dexamethasone;Moist Heat;Ultrasound;Gait training;Stair training;Functional mobility training;Therapeutic activities;Therapeutic exercise;Balance training;Neuromuscular re-education;Patient/family education;Manual techniques;Passive range of motion;Dry needling;Energy conservation;Taping    PT Next Visit Plan  review/update HEP, 6 MWT, BERG, manual therapy R ITB and piriformis prn, hip strenghening    PT Home Exercise Plan  ITB stretch, adductor stretch, sidelying clamshells, bridges    Consulted and Agree with Plan of Care  Patient       Patient will benefit from skilled therapeutic intervention in order to improve the following deficits and impairments:  Abnormal gait, Pain, Decreased mobility, Postural dysfunction, Decreased activity tolerance, Decreased endurance, Decreased strength, Decreased range of motion, Hypomobility, Decreased balance, Difficulty walking, Impaired flexibility  Visit Diagnosis: Pain in right hip  Other abnormalities of gait and mobility  Muscle weakness (generalized)     Problem List Patient Active Problem List   Diagnosis Date Noted  . Stress 07/16/2018  . History of hip replacement 08/24/2017  . Primary osteoarthritis of right hip 07/11/2017  . Coronary artery  disease 04/25/2017  . Seasonal allergies 04/25/2017  . Hyperlipidemia LDL goal <70 03/25/2016  . Cardiomyopathy, ischemic 03/25/2016  . ST elevation (STEMI) myocardial infarction involving left circumflex coronary artery ([place]) 03/16/2016  . Essential hypertension 03/09/2016  . Decreased visual acuity 03/09/2016  . Dental decay 03/09/2016   Worthy Flank, SPT 07/17/18 5:13 PM   [facility] [address] [place], [place], [address] Phone: [phone]   Fax:  [phone]  Name: Kenneth Long MRN: [id number] Date of Birth: 09/07/1966

## 2018-07-18 ENCOUNTER — Telehealth: Payer: Self-pay | Admitting: Licensed Clinical Social Worker

## 2018-07-18 NOTE — Telephone Encounter (Signed)
LCSW called patient to schedule a counseling session, per referral from Dr. Amil Amen. Left voicemail.

## 2018-07-19 ENCOUNTER — Other Ambulatory Visit: Payer: Self-pay

## 2018-07-19 MED ORDER — CIPROFLOXACIN HCL 500 MG PO TABS: 500.0000 mg | ORAL_TABLET | Freq: Two times a day (BID) | ORAL | 0 refills | Status: DC

## 2018-07-24 ENCOUNTER — Other Ambulatory Visit: Payer: Self-pay

## 2018-07-25 ENCOUNTER — Encounter: Payer: Self-pay | Admitting: Physical Therapy

## 2018-07-25 ENCOUNTER — Ambulatory Visit: Payer: No Typology Code available for payment source | Attending: Orthopaedic Surgery | Admitting: Physical Therapy

## 2018-07-25 DIAGNOSIS — M25551 Pain in right hip: Secondary | ICD-10-CM

## 2018-07-25 DIAGNOSIS — R2689 Other abnormalities of gait and mobility: Secondary | ICD-10-CM

## 2018-07-25 DIAGNOSIS — M6281 Muscle weakness (generalized): Secondary | ICD-10-CM | POA: Insufficient documentation

## 2018-07-25 NOTE — Therapy (Signed)
[place], [place], [address] Phone: [phone]   Fax:  [phone]  Physical Therapy Treatment  Patient Details  Name: JAMAN ARO MRN: [id number] Date of Birth: 1966/06/06 Referring Provider: Leandrew Koyanagi, MD   Encounter Date: 07/25/2018  PT End of Session - 07/25/18 1502    Visit Number  2    Number of Visits  13    Date for PT Re-Evaluation  08/28/18    PT Start Time  3664    PT Stop Time  1539    PT Time Calculation (min)  42 min    Activity Tolerance  Patient tolerated treatment well    Behavior During Therapy  [facility] for tasks assessed/performed       Past Medical History:  Diagnosis Date  . Arthritis   . Asthma   . Coronary artery disease   . Dyspnea    "sometimes" with exertion   . Headache   . Heart murmur   . Hypertension   . Sleep apnea    no CPAP  . ST elevation (STEMI) myocardial infarction involving left circumflex coronary artery ([place]) 03/16/2016   DES CFX  . Umbilical hernia     Past Surgical History:  Procedure Laterality Date  . CARDIAC CATHETERIZATION N/A 03/16/2016   Procedure: Left Heart Cath and Coronary Angiography;  Surgeon: Peter M Martinique, MD;  LAD OK, CFX 100%, OM1 20%, RCA 20%, EF nl  . CARDIAC CATHETERIZATION N/A 03/16/2016   Procedure: Coronary Stent Intervention;  Surgeon: Peter M Martinique, MD; PROMUS PREM MR 3.0X16 mm DES   . TOTAL HIP ARTHROPLASTY Right 08/24/2017   Procedure: RIGHT TOTAL HIP ARTHROPLASTY ANTERIOR APPROACH;  Surgeon: Leandrew Koyanagi, MD;  Location: [place];  Service: Orthopedics;  Laterality: Right;  RIGHT TOTAL HIP ARTHROPLASTY ANTERIOR APPROACH  . UMBILICAL HERNIA REPAIR  4034   umbilical hernia repair    There were no vitals filed for this visit.  Subjective Assessment - 07/25/18 1458    Subjective  "I am feeling fine today. I have been doing my exercises and I think they are helping."    Currently in Pain?  Yes    Pain Score  5     Pain Location  Hip    Pain Orientation  Right;Lateral    Pain Descriptors / Indicators  Sharp    Pain Type  Chronic pain    Pain Onset  More than a month ago    Pain Frequency  Constant    Aggravating Factors   walking, standing, sitting    Pain Relieving Factors  soak in bathtub                       [facility] Adult PT Treatment/Exercise - 07/25/18 0001      Knee/Hip Exercises: Stretches   ITB Stretch  2 reps;30 seconds    Piriformis Stretch  2 reps;30 seconds;Right      Knee/Hip Exercises: Aerobic   Nustep  L4 x 5 min UE and LE      Knee/Hip Exercises: Seated   Long Arc Quad  10 reps;3 sets 1st set no weight; 2lb weight 2nd and 3rd set    [employer] Weight  2 lbs.    Other Seated Knee/Hip Exercises  seated ER with red theraband 3 x 10-15 reps    Hamstring Curl  15 reps;Right;AROM;Strengthening;3 sets    Sit to Sand  2 sets;10 reps;without UE support cues  to increase weight through RLE      Knee/Hip Exercises: Supine   Bridges  2 sets;10 reps;Both    Straight Leg Raises  AROM;2 sets;10 reps;Right;Strengthening    Other Supine Knee/Hip Exercises  pelvic tilts with 5-10 sec hold x 10      Knee/Hip Exercises: Sidelying   Clams  2 x 10; cues needed for correct technique               PT Short Term Goals - 07/17/18 1652      PT SHORT TERM GOAL #1   Title  Pt will be I with HEP    Time  3    Period  Weeks    Status  New    Target Date  08/07/18      PT SHORT TERM GOAL #2   Title  Pt will verbalize techniques to decrease pain in hip in order to increase quality of life    Time  3    Period  Weeks    Status  New    Target Date  08/07/18        PT Long Term Goals - 07/17/18 1654      PT LONG TERM GOAL #1   Title  Pt will demonstrate increased R hip strength >/= 4/5 to decrease pain to </= 2/10 and improve functional mobility    Time  6    Period  Weeks    Status  New    Target Date  08/28/18      PT LONG TERM GOAL #2   Title  Pt will  increase R hip flexion AROM to >/= 90 degrees and R hip extension AROM to >/= 15 degree to improve functional mobility     Time  6    Period  Weeks    Status  New    Target Date  08/28/18      PT LONG TERM GOAL #3   Title  Pt will decrease FOTO score to </= 45% limited to improve function and quality of life    Time  6    Period  Weeks    Status  New    Target Date  08/28/18      PT LONG TERM GOAL #4   Title  Pt will be able to stand and walk >/= 60 minutes to meet pt's goals of returning to work and playing basketball    Time  6    Period  Weeks    Status  New    Target Date  08/28/18            Plan - 07/25/18 1515    Clinical Impression Statement  Treatment today focused on stretching and STM of R ITB and core/hip/knee strenghthening. Pt fatigues quickly and is quite weak. Pt needed moderate cueing when reviewing previously assigned HEP. Updated HEP to include sit to stands without UE support, SLR, and pelvic tilts.     PT Treatment/Interventions  ADLs/Self Care Home Management;Cryotherapy;Electrical Stimulation;Iontophoresis 4mg /ml Dexamethasone;Moist Heat;Ultrasound;Gait training;Stair training;Functional mobility training;Therapeutic activities;Therapeutic exercise;Balance training;Neuromuscular re-education;Patient/family education;Manual techniques;Passive range of motion;Dry needling;Energy conservation;Taping    PT Next Visit Plan  review/update HEP, BERG, manual therapy R ITB and piriformis prn, hip strenghening, core strengthening    PT Home Exercise Plan  ITB stretch, adductor stretch, sidelying clamshells, bridges, sit to stands, SLR, pelvic tilts    Consulted and Agree with Plan of Care  Patient       Patient will benefit from  skilled therapeutic intervention in order to improve the following deficits and impairments:  Abnormal gait, Pain, Decreased mobility, Postural dysfunction, Decreased activity tolerance, Decreased endurance, Decreased strength, Decreased range  of motion, Hypomobility, Decreased balance, Difficulty walking, Impaired flexibility  Visit Diagnosis: Pain in right hip  Other abnormalities of gait and mobility  Muscle weakness (generalized)     Problem List Patient Active Problem List   Diagnosis Date Noted  . Stress 07/16/2018  . History of hip replacement 08/24/2017  . Primary osteoarthritis of right hip 07/11/2017  . Coronary artery disease 04/25/2017  . Seasonal allergies 04/25/2017  . Hyperlipidemia LDL goal <70 03/25/2016  . Cardiomyopathy, ischemic 03/25/2016  . ST elevation (STEMI) myocardial infarction involving left circumflex coronary artery ([place]) 03/16/2016  . Essential hypertension 03/09/2016  . Decreased visual acuity 03/09/2016  . Dental decay 03/09/2016   Worthy Flank, SPT 07/25/18 3:45 PM   [facility] [address] [place], [place], [address] Phone: [phone]   Fax:  [phone]  Name: JAECION DEMPSTER MRN: [id number] Date of Birth: 02/08/1966

## 2018-07-28 ENCOUNTER — Encounter

## 2018-07-28 ENCOUNTER — Other Ambulatory Visit: Payer: Self-pay

## 2018-07-28 VITALS — BP 132/82 | HR 76

## 2018-07-28 DIAGNOSIS — R319 Hematuria, unspecified: Secondary | ICD-10-CM

## 2018-07-28 DIAGNOSIS — I10 Essential (primary) hypertension: Secondary | ICD-10-CM

## 2018-07-28 DIAGNOSIS — N390 Urinary tract infection, site not specified: Secondary | ICD-10-CM

## 2018-07-28 LAB — POCT URINALYSIS DIPSTICK
BILIRUBIN UA: NEGATIVE
Glucose, UA: NEGATIVE
Ketones, UA: NEGATIVE
Nitrite, UA: NEGATIVE
Protein, UA: NEGATIVE
Spec Grav, UA: 1.015 (ref 1.010–1.025)
Urobilinogen, UA: 0.2 E.U./dL
pH, UA: 5 (ref 5.0–8.0)

## 2018-07-28 NOTE — Progress Notes (Signed)
Patient BP now in normal range. Informed to continue current dose of medication. Spoke with Dr. Amil Amen regarding UA. Symptoms about the same. UA still Mod blood and a trace of leuks. Per Dr. Amil Amen have patient recheck UA in 1 month. Patient notified.

## 2018-08-01 ENCOUNTER — Ambulatory Visit: Payer: Self-pay | Admitting: Physical Therapy

## 2018-08-01 ENCOUNTER — Encounter: Payer: Self-pay | Admitting: Physical Therapy

## 2018-08-01 DIAGNOSIS — M6281 Muscle weakness (generalized): Secondary | ICD-10-CM

## 2018-08-01 DIAGNOSIS — R2689 Other abnormalities of gait and mobility: Secondary | ICD-10-CM

## 2018-08-01 DIAGNOSIS — M25551 Pain in right hip: Secondary | ICD-10-CM

## 2018-08-01 NOTE — Patient Instructions (Signed)
Hip Flexor Stretch    Lying on back near edge of bed, bend one leg, foot flat. Hang other leg over edge, relaxed, thigh resting entirely on bed for _1/5 to 1 minute___ minutes. Repeat _3___ times. Do __1-2__ sessions per day. Advanced Exercise: Bend knee back keeping thigh in contact with bed.  http://gt2.exer.us/346   Copyright  VHI. All rights reserved.

## 2018-08-01 NOTE — Therapy (Signed)
[place], [place], [address] Phone: [phone]   Fax:  [phone]  Physical Therapy Treatment  Patient Details  Name: Kenneth Long MRN: [id number] Date of Birth: August 26, 1966 Referring Provider: Leandrew Koyanagi, MD   Encounter Date: 08/01/2018  PT End of Session - 08/01/18 1636    Visit Number  3    Number of Visits  13    Date for PT Re-Evaluation  08/28/18    PT Start Time  2585    PT Stop Time  1630    PT Time Calculation (min)  45 min    Activity Tolerance  Patient tolerated treatment well    Behavior During Therapy  [facility] for tasks assessed/performed       Past Medical History:  Diagnosis Date  . Arthritis   . Asthma   . Coronary artery disease   . Dyspnea    "sometimes" with exertion   . Headache   . Heart murmur   . Hypertension   . Sleep apnea    no CPAP  . ST elevation (STEMI) myocardial infarction involving left circumflex coronary artery ([place]) 03/16/2016   DES CFX  . Umbilical hernia     Past Surgical History:  Procedure Laterality Date  . CARDIAC CATHETERIZATION N/A 03/16/2016   Procedure: Left Heart Cath and Coronary Angiography;  Surgeon: Peter M Martinique, MD;  LAD OK, CFX 100%, OM1 20%, RCA 20%, EF nl  . CARDIAC CATHETERIZATION N/A 03/16/2016   Procedure: Coronary Stent Intervention;  Surgeon: Peter M Martinique, MD; PROMUS PREM MR 3.0X16 mm DES   . TOTAL HIP ARTHROPLASTY Right 08/24/2017   Procedure: RIGHT TOTAL HIP ARTHROPLASTY ANTERIOR APPROACH;  Surgeon: Leandrew Koyanagi, MD;  Location: [place];  Service: Orthopedics;  Laterality: Right;  RIGHT TOTAL HIP ARTHROPLASTY ANTERIOR APPROACH  . UMBILICAL HERNIA REPAIR  2778   umbilical hernia repair    There were no vitals filed for this visit.  Subjective Assessment - 08/01/18 1549    Subjective  nO PAIN RIGHT NOW.  he is doing the exercises and he was able to have less trouble at the grocery store. Foot has not swollen since the  first visit with PT.     Pain Orientation  Right;Anterior;Posterior    Pain Descriptors / Indicators  Sharp    Pain Type  Chronic pain    Pain Radiating Towards  to mid thigh    Pain Frequency  Intermittent    Aggravating Factors   sitting long time  gets up 1-2 X with 1.5 hour movie,  standing limites 20-30 minutes.     Pain Relieving Factors  limits  sleeping, walking,  slouching while sitting                       [facility] Adult PT Treatment/Exercise - 08/01/18 0001      Berg Balance Test   Sit to Stand  Able to stand  independently using hands    Standing Unsupported  Able to stand safely 2 minutes    Sitting with Back Unsupported but Feet Supported on Floor or Stool  Able to sit safely and securely 2 minutes    Stand to Sit  Sits safely with minimal use of hands    Transfers  Able to transfer safely, definite need of hands    Standing Unsupported with Eyes Closed  Able to stand 10 seconds safely    Standing Ubsupported with Feet Together  Able to place feet together independently and stand 1 minute safely    From Standing, Reach Forward with Outstretched Arm  Can reach forward >12 cm safely (5")    From Standing Position, Pick up Object from [place] to pick up shoe safely and easily    From Standing Position, Turn to Look Behind Over each Shoulder  Looks behind one side only/other side shows less weight shift    Turn 360 Degrees  Able to turn 360 degrees safely but slowly   5 seconds    Standing Unsupported, Alternately Place Feet on Step/Stool  Able to complete >2 steps/needs minimal assist    Standing Unsupported, One Foot in Front  Able to place foot tandem independently and hold 30 seconds    Standing on One Leg  Able to lift leg independently and hold 5-10 seconds    Total Score  46      Knee/Hip Exercises: Stretches   Hip Flexor Stretch  3 reps;30 seconds    Hip Flexor Stretch Limitations  DECREASED rom,  HEP      Manual Therapy   Manual therapy comments   soft tissue work anterior lateral proximal hip  softened tissue and decreased pain,  instrument assist  tennis ballk issued after instruction on how to use.              PT Education - 08/01/18 1634    Education Details  hep,  ANATOMY    Person(s) Educated  Patient    Methods  Explanation;Verbal cues;Handout;Tactile cues    Comprehension  Verbalized understanding;Returned demonstration       PT Short Term Goals - 07/17/18 1652      PT SHORT TERM GOAL #1   Title  Pt will be I with HEP    Time  3    Period  Weeks    Status  New    Target Date  08/07/18      PT SHORT TERM GOAL #2   Title  Pt will verbalize techniques to decrease pain in hip in order to increase quality of life    Time  3    Period  Weeks    Status  New    Target Date  08/07/18        PT Long Term Goals - 07/17/18 1654      PT LONG TERM GOAL #1   Title  Pt will demonstrate increased R hip strength >/= 4/5 to decrease pain to </= 2/10 and improve functional mobility    Time  6    Period  Weeks    Status  New    Target Date  08/28/18      PT LONG TERM GOAL #2   Title  Pt will increase R hip flexion AROM to >/= 90 degrees and R hip extension AROM to >/= 15 degree to improve functional mobility     Time  6    Period  Weeks    Status  New    Target Date  08/28/18      PT LONG TERM GOAL #3   Title  Pt will decrease FOTO score to </= 45% limited to improve function and quality of life    Time  6    Period  Weeks    Status  New    Target Date  08/28/18      PT LONG TERM GOAL #4   Title  Pt will be able to stand and walk >/=  60 minutes to meet pt's goals of returning to work and playing basketball    Time  6    Period  Weeks    Status  New    Target Date  08/28/18            Plan - 08/01/18 1637    Clinical Impression Statement  Patient scored a 46/56 BERG.  Hip flexor tight right, able to address this with HEP stretching .less pain noted at end of session.     PT Next Visit Plan   review/update HEP,Interperet  BERG and inform patient of results. , manual therapy R ITB and piriformis prn, hip strenghening, core strengthening,  check leg length if not already done    PT Home Exercise Plan  ITB stretch, adductor stretch, sidelying clamshells, bridges, sit to stands, SLR, pelvic tilts,  hip flexor stretch    Consulted and Agree with Plan of Care  Patient       Patient will benefit from skilled therapeutic intervention in order to improve the following deficits and impairments:     Visit Diagnosis: Pain in right hip  Other abnormalities of gait and mobility  Muscle weakness (generalized)     Problem List Patient Active Problem List   Diagnosis Date Noted  . Stress 07/16/2018  . History of hip replacement 08/24/2017  . Primary osteoarthritis of right hip 07/11/2017  . Coronary artery disease 04/25/2017  . Seasonal allergies 04/25/2017  . Hyperlipidemia LDL goal <70 03/25/2016  . Cardiomyopathy, ischemic 03/25/2016  . ST elevation (STEMI) myocardial infarction involving left circumflex coronary artery ([place]) 03/16/2016  . Essential hypertension 03/09/2016  . Decreased visual acuity 03/09/2016  . Dental decay 03/09/2016    Ayat Drenning PTA 08/01/2018, 4:47 PM  [facility] [address] [place], [place], [address] Phone: [phone]   Fax:  [phone]  Name: Kenneth Long MRN: [id number] Date of Birth: 04/05/1966

## 2018-08-03 ENCOUNTER — Encounter: Payer: Self-pay | Admitting: Physical Therapy

## 2018-08-03 ENCOUNTER — Ambulatory Visit: Payer: Self-pay | Admitting: Physical Therapy

## 2018-08-03 DIAGNOSIS — M25551 Pain in right hip: Secondary | ICD-10-CM

## 2018-08-03 DIAGNOSIS — R2689 Other abnormalities of gait and mobility: Secondary | ICD-10-CM

## 2018-08-03 DIAGNOSIS — M6281 Muscle weakness (generalized): Secondary | ICD-10-CM

## 2018-08-03 NOTE — Therapy (Signed)
[place], [place], [address] Phone: [phone]   Fax:  [phone]  Physical Therapy Treatment  Patient Details  Name: Kenneth Long MRN: [id number] Date of Birth: 1966-02-07 Referring Provider: Leandrew Koyanagi, MD   Encounter Date: 08/03/2018  PT End of Session - 08/03/18 1434    Visit Number  4    Number of Visits  13    Date for PT Re-Evaluation  08/28/18    PT Start Time  0230    PT Stop Time  0312    PT Time Calculation (min)  42 min       Past Medical History:  Diagnosis Date  . Arthritis   . Asthma   . Coronary artery disease   . Dyspnea    "sometimes" with exertion   . Headache   . Heart murmur   . Hypertension   . Sleep apnea    no CPAP  . ST elevation (STEMI) myocardial infarction involving left circumflex coronary artery ([place]) 03/16/2016   DES CFX  . Umbilical hernia     Past Surgical History:  Procedure Laterality Date  . CARDIAC CATHETERIZATION N/A 03/16/2016   Procedure: Left Heart Cath and Coronary Angiography;  Surgeon: Peter M Martinique, MD;  LAD OK, CFX 100%, OM1 20%, RCA 20%, EF nl  . CARDIAC CATHETERIZATION N/A 03/16/2016   Procedure: Coronary Stent Intervention;  Surgeon: Peter M Martinique, MD; PROMUS PREM MR 3.0X16 mm DES   . TOTAL HIP ARTHROPLASTY Right 08/24/2017   Procedure: RIGHT TOTAL HIP ARTHROPLASTY ANTERIOR APPROACH;  Surgeon: Leandrew Koyanagi, MD;  Location: [place];  Service: Orthopedics;  Laterality: Right;  RIGHT TOTAL HIP ARTHROPLASTY ANTERIOR APPROACH  . UMBILICAL HERNIA REPAIR  2505   umbilical hernia repair    There were no vitals filed for this visit.  Subjective Assessment - 08/03/18 1432    Subjective  Doing exercises. Felt fine after last visit.     Currently in Pain?  Yes    Pain Score  5     Pain Location  Hip    Pain Orientation  Right    Pain Descriptors / Indicators  Aching                       [facility] Adult PT  Treatment/Exercise - 08/03/18 0001      Knee/Hip Exercises: Stretches   Hip Flexor Stretch  3 reps;30 seconds    ITB Stretch  2 reps;30 seconds    Piriformis Stretch  2 reps;30 seconds;Right    Other Knee/Hip Stretches  figure 4 stretch modified 2 x 30 sec      Knee/Hip Exercises: Seated   Long Arc Quad  10 reps;3 sets    [employer] Weight  3 lbs.    Other Seated Knee/Hip Exercises  --    Hamstring Curl  15 reps;Right;AROM;Strengthening;3 sets   red band   Sit to Sand  2 sets;10 reps;without UE support   cues to increase weight through RLE      Knee/Hip Exercises: Supine   Bridges  2 sets;10 reps;Both    Straight Leg Raises  AROM;2 sets;10 reps;Right;Strengthening    Other Supine Knee/Hip Exercises  pelvic tilts with 5-10 sec hold x 10    Other Supine Knee/Hip Exercises  clams red 15 x 2       Knee/Hip Exercises: Sidelying   Clams  2 x 10; cues needed for correct technique  Other Sidelying Knee/Hip Exercises  reverse clam 10 x 2       Manual Therapy   Manual therapy comments  massage roller to Rt ITB and quad during hip flexor stretch                PT Short Term Goals - 07/17/18 1652      PT SHORT TERM GOAL #1   Title  Pt will be I with HEP    Time  3    Period  Weeks    Status  New    Target Date  08/07/18      PT SHORT TERM GOAL #2   Title  Pt will verbalize techniques to decrease pain in hip in order to increase quality of life    Time  3    Period  Weeks    Status  New    Target Date  08/07/18        PT Long Term Goals - 07/17/18 1654      PT LONG TERM GOAL #1   Title  Pt will demonstrate increased R hip strength >/= 4/5 to decrease pain to </= 2/10 and improve functional mobility    Time  6    Period  Weeks    Status  New    Target Date  08/28/18      PT LONG TERM GOAL #2   Title  Pt will increase R hip flexion AROM to >/= 90 degrees and R hip extension AROM to >/= 15 degree to improve functional mobility     Time  6    Period  Weeks     Status  New    Target Date  08/28/18      PT LONG TERM GOAL #3   Title  Pt will decrease FOTO score to </= 45% limited to improve function and quality of life    Time  6    Period  Weeks    Status  New    Target Date  08/28/18      PT LONG TERM GOAL #4   Title  Pt will be able to stand and walk >/= 60 minutes to meet pt's goals of returning to work and playing basketball    Time  6    Period  Weeks    Status  New    Target Date  08/28/18            Plan - 08/03/18 1505    Clinical Impression Statement  Continued with stretching and strengthening as tolerated. Pt reports tennis ball for self massage helpful from last visit. Compliant with HEP.     PT Next Visit Plan  review/update HEP,Interperet  BERG and inform patient of results. , manual therapy R ITB and piriformis prn, hip strenghening, core strengthening,  check leg length if not already done    PT Home Exercise Plan  ITB stretch, adductor stretch, sidelying clamshells, bridges, sit to stands, SLR, pelvic tilts,  hip flexor stretch    Consulted and Agree with Plan of Care  Patient       Patient will benefit from skilled therapeutic intervention in order to improve the following deficits and impairments:  Abnormal gait, Pain, Decreased mobility, Postural dysfunction, Decreased activity tolerance, Decreased endurance, Decreased strength, Decreased range of motion, Hypomobility, Decreased balance, Difficulty walking, Impaired flexibility  Visit Diagnosis: Pain in right hip  Other abnormalities of gait and mobility  Muscle weakness (generalized)     Problem List  Patient Active Problem List   Diagnosis Date Noted  . Stress 07/16/2018  . History of hip replacement 08/24/2017  . Primary osteoarthritis of right hip 07/11/2017  . Coronary artery disease 04/25/2017  . Seasonal allergies 04/25/2017  . Hyperlipidemia LDL goal <70 03/25/2016  . Cardiomyopathy, ischemic 03/25/2016  . ST elevation (STEMI) myocardial  infarction involving left circumflex coronary artery ([place]) 03/16/2016  . Essential hypertension 03/09/2016  . Decreased visual acuity 03/09/2016  . Dental decay 03/09/2016    [name], PTA 08/03/2018, 3:09 PM  [facility] [address] [place], [place], [address] Phone: [phone]   Fax:  [phone]  Name: Kenneth Long MRN: [id number] Date of Birth: 10-02-1966

## 2018-08-08 ENCOUNTER — Ambulatory Visit: Payer: Self-pay | Admitting: Physical Therapy

## 2018-08-08 ENCOUNTER — Encounter: Payer: Self-pay | Admitting: Physical Therapy

## 2018-08-08 DIAGNOSIS — M25551 Pain in right hip: Secondary | ICD-10-CM

## 2018-08-08 DIAGNOSIS — M6281 Muscle weakness (generalized): Secondary | ICD-10-CM

## 2018-08-08 DIAGNOSIS — R2689 Other abnormalities of gait and mobility: Secondary | ICD-10-CM

## 2018-08-08 NOTE — Patient Instructions (Signed)
RICE for Routine Care of Injuries Many injuries can be cared for using rest, ice, compression, and elevation (RICE therapy). Using RICE therapy can help to lessen pain and swelling. It can help your body to heal. Rest Reduce your normal activities and avoid using the injured part of your body. You can go back to your normal activities when you feel okay and your doctor says it is okay. Ice Do not put ice on your bare skin.  Put ice in a plastic bag.  Place a towel between your skin and the bag.  Leave the ice on for 20 minutes, 2-3 times a day.  Do this for as long as told by your doctor. Compression Compression means putting pressure on the injured area. This can be done with an elastic bandage. If an elastic bandage has been applied:  Remove and reapply the bandage every 3-4 hours or as told by your doctor.  Make sure the bandage is not wrapped too tight. Wrap the bandage more loosely if part of your body beyond the bandage is blue, swollen, cold, painful, or loses feeling (numb).  See your doctor if the bandage seems to make your problems worse.  Elevation Elevation means keeping the injured area raised. Raise the injured area above your heart or the center of your chest if you can. When should I get help? You should get help if:  You keep having pain and swelling.  Your symptoms get worse.  Get help right away if: You should get help right away if:  You have sudden bad pain at or below the area of your injury.  You have redness or more swelling around your injury.  You have tingling or numbness at or below the injury that does not go away when you take off the bandage.  This information is not intended to replace advice given to you by your health care provider. Make sure you discuss any questions you have with your health care provider. Document Released: 05/24/2008 Document Revised: 11/02/2016 Document Reviewed: 11/13/2014 Elsevier Interactive Patient Education  2017  [employer]  

## 2018-08-08 NOTE — Therapy (Signed)
[place], [place], [address] Phone: [phone]   Fax:  [phone]  Physical Therapy Treatment  Patient Details  Name: Kenneth [place] MRN: [id number] Date of Birth: [birth year] Referring Provider: Leandrew Koyanagi, MD   Encounter Date: 08/08/2018  PT End of Session - 08/08/18 1813    Visit Number  5    Number of Visits  13    Date for PT Re-Evaluation  08/28/18    PT Start Time  6237    PT Stop Time  1630    PT Time Calculation (min)  45 min    Activity Tolerance  Patient tolerated treatment well;Patient limited by pain    Behavior During Therapy  [facility] for tasks assessed/performed       Past Medical History:  Diagnosis Date  . Arthritis   . Asthma   . Coronary artery disease   . Dyspnea    "sometimes" with exertion   . Headache   . Heart murmur   . Hypertension   . Sleep apnea    no CPAP  . ST elevation (STEMI) myocardial infarction involving left circumflex coronary artery ([place]) 03/16/2016   DES CFX  . Umbilical hernia     Past Surgical History:  Procedure Laterality Date  . CARDIAC CATHETERIZATION N/A 03/16/2016   Procedure: Left Heart Cath and Coronary Angiography;  Surgeon: Peter M Martinique, MD;  LAD OK, CFX 100%, OM1 20%, RCA 20%, EF nl  . CARDIAC CATHETERIZATION N/A 03/16/2016   Procedure: Coronary Stent Intervention;  Surgeon: Peter M Martinique, MD; PROMUS PREM MR 3.0X16 mm DES   . TOTAL HIP ARTHROPLASTY Right 08/24/2017   Procedure: RIGHT TOTAL HIP ARTHROPLASTY ANTERIOR APPROACH;  Surgeon: Leandrew Koyanagi, MD;  Location: [place];  Service: Orthopedics;  Laterality: Right;  RIGHT TOTAL HIP ARTHROPLASTY ANTERIOR APPROACH  . UMBILICAL HERNIA REPAIR  6283   umbilical hernia repair    There were no vitals filed for this visit.  Subjective Assessment - 08/08/18 1559    Subjective  Pain is worse.  It hurts to walk  8/10.  Numbness on bottom of right foot when getting out of bed,  Noted right  foot swelling.    Currently in Pain?  Yes    Pain Score  8     Pain Location  Hip    Pain Orientation  Right    Pain Descriptors / Indicators  Numbness;Sharp    Pain Frequency  Constant    Aggravating Factors   worse with walking, worse in am worse with sitting    Pain Relieving Factors  bath with epson salts helps,  tremadol to help sleeping                       [facility] Adult PT Treatment/Exercise - 08/08/18 0001      Ambulation/Gait   Ambulation/Gait  Yes    Gait Comments  suggested single crutch  for gait   patient able to improve gait  and decrease pain  with decreased rotation. Practiced with walker and 1 crutch both were helpful.       Self-Care   Self-Care  RICE  Use compression shorts,  Rest Ice 3 x a day until numb to block pain.  Use 1 crutch     Knee/Hip Exercises: Stretches   Other Knee/Hip Stretches  Warrior's pose for Pictineous( Sp?) stretch  Kris instructed patient how to do      Cryotherapy  Number Minutes Cryotherapy  10 Minutes    Cryotherapy Location  Hip   groin   Type of Cryotherapy  --   cold pack     Manual Therapy   Manual therapy comments  tender with palpation.  PT Carlus Pavlov  checked patient and determines Pectineous (Sp)  was likely the sore muscle.               PT Education - 08/08/18 1803    Education Details  RICE,  gait    Person(s) Educated  Patient    Methods  Explanation;Demonstration;Verbal cues;Handout    Comprehension  Verbalized understanding;Returned demonstration       PT Short Term Goals - 07/17/18 1652      PT SHORT TERM GOAL #1   Title  Pt will be I with HEP    Time  3    Period  Weeks    Status  New    Target Date  08/07/18      PT SHORT TERM GOAL #2   Title  Pt will verbalize techniques to decrease pain in hip in order to increase quality of life    Time  3    Period  Weeks    Status  New    Target Date  08/07/18        PT Long Term Goals - 07/17/18 1654      PT LONG TERM GOAL #1    Title  Pt will demonstrate increased R hip strength >/= 4/5 to decrease pain to </= 2/10 and improve functional mobility    Time  6    Period  Weeks    Status  New    Target Date  08/28/18      PT LONG TERM GOAL #2   Title  Pt will increase R hip flexion AROM to >/= 90 degrees and R hip extension AROM to >/= 15 degree to improve functional mobility     Time  6    Period  Weeks    Status  New    Target Date  08/28/18      PT LONG TERM GOAL #3   Title  Pt will decrease FOTO score to </= 45% limited to improve function and quality of life    Time  6    Period  Weeks    Status  New    Target Date  08/28/18      PT LONG TERM GOAL #4   Title  Pt will be able to stand and walk >/= 60 minutes to meet pt's goals of returning to work and playing basketball    Time  6    Period  Weeks    Status  New    Target Date  08/28/18            Plan - 08/08/18 1814    Clinical Impression Statement  Pain flare in hip.  Hip adductor flared most likely from altered gait pattern .  Pain improved with use of crutch .  RICE highly suggested.     PT Next Visit Plan  see if he is using crutch for gait.  gradual strengthening consider prone hip extension,  supine ball squeeze, bridges.    PT Home Exercise Plan  ITB stretch, adductor stretch, sidelying clamshells, bridges, sit to stands, SLR, pelvic tilts,  hip flexor stretch,  warrior pose for anterior hip stretch    Consulted and Agree with Plan of Care  Patient  Patient will benefit from skilled therapeutic intervention in order to improve the following deficits and impairments:     Visit Diagnosis: Pain in right hip  Other abnormalities of gait and mobility  Muscle weakness (generalized)     Problem List Patient Active Problem List   Diagnosis Date Noted  . Stress 07/16/2018  . History of hip replacement 08/24/2017  . Primary osteoarthritis of right hip 07/11/2017  . Coronary artery disease 04/25/2017  . Seasonal allergies  04/25/2017  . Hyperlipidemia LDL goal <70 03/25/2016  . Cardiomyopathy, ischemic 03/25/2016  . ST elevation (STEMI) myocardial infarction involving left circumflex coronary artery ([place]) 03/16/2016  . Essential hypertension 03/09/2016  . Decreased visual acuity 03/09/2016  . Dental decay 03/09/2016    Cherly Erno  PTA 08/08/2018, 6:22 PM  [facility] [address] [place], [place], [address] Phone: [phone]   Fax:  [phone]  Name: Kenneth [place] MRN: [id number] Date of Birth: 1966/09/10

## 2018-08-10 ENCOUNTER — Encounter: Payer: Self-pay | Admitting: Physical Therapy

## 2018-08-10 ENCOUNTER — Ambulatory Visit: Payer: Self-pay | Admitting: Physical Therapy

## 2018-08-10 DIAGNOSIS — M25551 Pain in right hip: Secondary | ICD-10-CM

## 2018-08-10 DIAGNOSIS — R2689 Other abnormalities of gait and mobility: Secondary | ICD-10-CM

## 2018-08-10 DIAGNOSIS — M6281 Muscle weakness (generalized): Secondary | ICD-10-CM

## 2018-08-10 NOTE — Therapy (Signed)
[place], [place], [address] Phone: [phone]   Fax:  [phone]  Physical Therapy Treatment  Patient Details  Name: Kenneth [place] MRN: [id number] Date of Birth: 03/28/1966 Referring Provider: Leandrew Koyanagi, MD   Encounter Date: 08/10/2018  PT End of Session - 08/10/18 1501    Visit Number  6    Number of Visits  13    Date for PT Re-Evaluation  08/28/18    PT Start Time  0230    PT Stop Time  0308    PT Time Calculation (min)  38 min       Past Medical History:  Diagnosis Date  . Arthritis   . Asthma   . Coronary artery disease   . Dyspnea    "sometimes" with exertion   . Headache   . Heart murmur   . Hypertension   . Sleep apnea    no CPAP  . ST elevation (STEMI) myocardial infarction involving left circumflex coronary artery ([place]) 03/16/2016   DES CFX  . Umbilical hernia     Past Surgical History:  Procedure Laterality Date  . CARDIAC CATHETERIZATION N/A 03/16/2016   Procedure: Left Heart Cath and Coronary Angiography;  Surgeon: Kenneth M Martinique, MD;  LAD OK, CFX 100%, OM1 20%, RCA 20%, EF nl  . CARDIAC CATHETERIZATION N/A 03/16/2016   Procedure: Coronary Stent Intervention;  Surgeon: Kenneth M Martinique, MD; PROMUS PREM MR 3.0X16 mm DES   . TOTAL HIP ARTHROPLASTY Right 08/24/2017   Procedure: RIGHT TOTAL HIP ARTHROPLASTY ANTERIOR APPROACH;  Surgeon: [place] Koyanagi, MD;  Location: [facility];  Service: Orthopedics;  Laterality: Right;  RIGHT TOTAL HIP ARTHROPLASTY ANTERIOR APPROACH  . UMBILICAL HERNIA REPAIR  1856   umbilical hernia repair    There were no vitals filed for this visit.  Subjective Assessment - 08/10/18 1434    Subjective  Pain 4 or 5/10 now. Stretching, ice and heat were helpful to reduce pain and swelling in hip.     Currently in Pain?  Yes    Pain Score  5     Pain Location  Hip    Pain Orientation  Right    Pain Descriptors / Indicators  Aching;Sore                        [facility] Adult PT Treatment/Exercise - 08/10/18 0001      Knee/Hip Exercises: Stretches   Hip Flexor Stretch  3 reps;30 seconds    Piriformis Stretch  2 reps;30 seconds;Right    Other Knee/Hip Stretches  Warrior's pose for Pectineous stretch  Kris instructed patient how to do, figure 4 stretch increased pain at groin      Knee/Hip Exercises: Aerobic   Nustep  L4 reduced to L2 x 4 minutes       Knee/Hip Exercises: Seated   Long Arc Quad  10 reps;3 sets    [employer] Weight  3 lbs.    Other Seated Knee/Hip Exercises  ball squeeze     Hamstring Curl  15 reps;Right;AROM;Strengthening;3 sets   red band     Knee/Hip Exercises: Supine   Bridges  2 sets;10 reps;Both    Other Supine Knee/Hip Exercises  pelvic tilts with 5-10 sec hold x 10    Other Supine Knee/Hip Exercises  clams red 15 x 2 , ball squeeze       Knee/Hip Exercises: Sidelying   Clams  2 x  10; cues needed for correct technique    Other Sidelying Knee/Hip Exercises  reverse clam x 5                PT Short Term Goals - 07/17/18 1652      PT SHORT TERM GOAL #1   Title  Pt will be I with HEP    Time  3    Period  Weeks    Status  New    Target Date  08/07/18      PT SHORT TERM GOAL #2   Title  Pt will verbalize techniques to decrease pain in hip in order to increase quality of life    Time  3    Period  Weeks    Status  New    Target Date  08/07/18        PT Long Term Goals - 07/17/18 1654      PT LONG TERM GOAL #1   Title  Pt will demonstrate increased R hip strength >/= 4/5 to decrease pain to </= 2/10 and improve functional mobility    Time  6    Period  Weeks    Status  New    Target Date  08/28/18      PT LONG TERM GOAL #2   Title  Pt will increase R hip flexion AROM to >/= 90 degrees and R hip extension AROM to >/= 15 degree to improve functional mobility     Time  6    Period  Weeks    Status  New    Target Date  08/28/18      PT LONG TERM GOAL #3    Title  Pt will decrease FOTO score to </= 45% limited to improve function and quality of life    Time  6    Period  Weeks    Status  New    Target Date  08/28/18      PT LONG TERM GOAL #4   Title  Pt will be able to stand and walk >/= 60 minutes to meet pt's goals of returning to work and playing basketball    Time  6    Period  Weeks    Status  New    Target Date  08/28/18            Plan - 08/10/18 1513    Clinical Impression Statement  Pt arrives reporting decreased pain. He did not use AD; He stayed off his feet, used ice and heat as well as performed Warrior Pose recommended lat visit. He reports swelling in hip and foot much improved. Performed gentle exercises carefully monitoring for increased pain. Ice post session.     PT Next Visit Plan  gradual strengthening consider prone hip extension,  supine ball squeeze, bridges.    PT Home Exercise Plan  ITB stretch, adductor stretch, sidelying clamshells, bridges, sit to stands, SLR, pelvic tilts,  hip flexor stretch,  warrior pose for anterior hip stretch    Consulted and Agree with Plan of Care  Patient       Patient will benefit from skilled therapeutic intervention in order to improve the following deficits and impairments:  Abnormal gait, Pain, Decreased mobility, Postural dysfunction, Decreased activity tolerance, Decreased endurance, Decreased strength, Decreased range of motion, Hypomobility, Decreased balance, Difficulty walking, Impaired flexibility  Visit Diagnosis: Pain in right hip  Other abnormalities of gait and mobility  Muscle weakness (generalized)     Problem List Patient  Active Problem List   Diagnosis Date Noted  . Stress 07/16/2018  . History of hip replacement 08/24/2017  . Primary osteoarthritis of right hip 07/11/2017  . Coronary artery disease 04/25/2017  . Seasonal allergies 04/25/2017  . Hyperlipidemia LDL goal <70 03/25/2016  . Cardiomyopathy, ischemic 03/25/2016  . ST elevation (STEMI)  myocardial infarction involving left circumflex coronary artery ([place]) 03/16/2016  . Essential hypertension 03/09/2016  . Decreased visual acuity 03/09/2016  . Dental decay 03/09/2016    Kenneth [place], PTA 08/10/2018, 3:29 PM  [facility] [address] [place], [place], [address] Phone: [phone]   Fax:  [phone]  Name: Kenneth [place] MRN: [id number] Date of Birth: [birth year]

## 2018-08-15 ENCOUNTER — Encounter: Payer: Self-pay | Admitting: Physical Therapy

## 2018-08-15 ENCOUNTER — Ambulatory Visit: Payer: Self-pay | Admitting: Physical Therapy

## 2018-08-15 DIAGNOSIS — M25551 Pain in right hip: Secondary | ICD-10-CM

## 2018-08-15 DIAGNOSIS — R2689 Other abnormalities of gait and mobility: Secondary | ICD-10-CM

## 2018-08-15 DIAGNOSIS — M6281 Muscle weakness (generalized): Secondary | ICD-10-CM

## 2018-08-15 NOTE — Patient Instructions (Signed)
Strengthening: Hip Extension (Prone)    Tighten muscles on front of left thigh, then lift leg __1__ inches from surface, keeping knee locked. Repeat __10__ times per set. Do 1-3____ sets per session. Do __1-2Quadriceps Set (Prone)    With toes supporting lower legs, tighten thigh muscles to straighten knees. Hold _0 to 3___ seconds. Relax. Repeat _10___ times per set. Do __1-3__ sets per session. Do ____ sessions per day. 1-2 http://orth.exer.us/726   Copyright  VHI. All rights reserved.  __ sessions per day.  http://orth.exer.us/620   Copyright  VHI. All rights reserved.

## 2018-08-15 NOTE — Therapy (Signed)
[place], [place], [address] Phone: [phone]   Fax:  [phone]  Physical Therapy Treatment  Patient Details  Name: Kenneth [place] MRN: [id number] Date of Birth: June 24, 1966 Referring Provider: Leandrew Koyanagi, MD   Encounter Date: 08/15/2018  PT End of Session - 08/15/18 1559    Visit Number  7    Number of Visits  13    Date for PT Re-Evaluation  08/28/18    PT Start Time  8416    PT Stop Time  1545    PT Time Calculation (min)  39 min    Activity Tolerance  Patient tolerated treatment well    Behavior During Therapy  [facility] for tasks assessed/performed       Past Medical History:  Diagnosis Date  . Arthritis   . Asthma   . Coronary artery disease   . Dyspnea    "sometimes" with exertion   . Headache   . Heart murmur   . Hypertension   . Sleep apnea    no CPAP  . ST elevation (STEMI) myocardial infarction involving left circumflex coronary artery ([place]) 03/16/2016   DES CFX  . Umbilical hernia     Past Surgical History:  Procedure Laterality Date  . CARDIAC CATHETERIZATION N/A 03/16/2016   Procedure: Left Heart Cath and Coronary Angiography;  Surgeon: Peter M Martinique, MD;  LAD OK, CFX 100%, OM1 20%, RCA 20%, EF nl  . CARDIAC CATHETERIZATION N/A 03/16/2016   Procedure: Coronary Stent Intervention;  Surgeon: Peter M Martinique, MD; PROMUS PREM MR 3.0X16 mm DES   . TOTAL HIP ARTHROPLASTY Right 08/24/2017   Procedure: RIGHT TOTAL HIP ARTHROPLASTY ANTERIOR APPROACH;  Surgeon: Leandrew Koyanagi, MD;  Location: [facility];  Service: Orthopedics;  Laterality: Right;  RIGHT TOTAL HIP ARTHROPLASTY ANTERIOR APPROACH  . UMBILICAL HERNIA REPAIR  6063   umbilical hernia repair    There were no vitals filed for this visit.  Subjective Assessment - 08/15/18 1510    Subjective  No pain now,  The warrior stretch really helped.,    Currently in Pain?  No/denies    Pain Score  6     Pain Location  Hip    Pain  Orientation  Right;Anterior    Pain Descriptors / Indicators  Aching;Sore    Pain Frequency  Intermittent    Aggravating Factors   worse with walkin, sitting, and in am    Pain Relieving Factors  stretch,  heat,  epson salts 1 x a week ,  ice                       [facility] Adult PT Treatment/Exercise - 08/15/18 0001      Self-Care   Self-Care  Other Self-Care Comments    Other Self-Care Comments   return to single crutch until you can get a cane.  RICE,  avoid this turning into a chronic pain      Knee/Hip Exercises: Stretches   Sports administrator  2 reps;30 seconds    Quad Stretch Limitations  thigh needed support,  not quit yet able to rest thigh on bed for this,  supine     Hip Flexor Stretch  2 reps;20 seconds    Hip Flexor Stretch Limitations  gentle  cuew pain free    Other Knee/Hip Stretches  review      Knee/Hip Exercises: Supine   Bridges  2 sets;10 reps;Both   cued for  a higher lift,  no pain   Other Supine Knee/Hip Exercises  AA hip flexion,  showed hoe to work on at home with resting leg on stool then lifting into flexion a few inches to get to 90 degrees     Other Supine Knee/Hip Exercises  ball squeeze 10 x 2 sets      Knee/Hip Exercises: Sidelying   Clams  --   too sore to do     Knee/Hip Exercises: Prone   Hip Extension  10 reps;2 sets             PT Education - 08/15/18 1551    Education Details  HEP,  self care    Person(s) Educated  Patient    Methods  Explanation;Tactile cues;Verbal cues    Comprehension  Verbalized understanding       PT Short Term Goals - 08/15/18 1513      PT SHORT TERM GOAL #1   Title  Pt will be I with HEP    Baseline  yes    Time  3    Period  Weeks    Status  Achieved      PT SHORT TERM GOAL #2   Title  Pt will verbalize techniques to decrease pain in hip in order to increase quality of life    Baseline  able to list 4 things to help pain.     Time  3    Period  Weeks    Status  Achieved        PT  Long Term Goals - 07/17/18 1654      PT LONG TERM GOAL #1   Title  Pt will demonstrate increased R hip strength >/= 4/5 to decrease pain to </= 2/10 and improve functional mobility    Time  6    Period  Weeks    Status  New    Target Date  08/28/18      PT LONG TERM GOAL #2   Title  Pt will increase R hip flexion AROM to >/= 90 degrees and R hip extension AROM to >/= 15 degree to improve functional mobility     Time  6    Period  Weeks    Status  New    Target Date  08/28/18      PT LONG TERM GOAL #3   Title  Pt will decrease FOTO score to </= 45% limited to improve function and quality of life    Time  6    Period  Weeks    Status  New    Target Date  08/28/18      PT LONG TERM GOAL #4   Title  Pt will be able to stand and walk >/= 60 minutes to meet pt's goals of returning to work and playing basketball    Time  6    Period  Weeks    Status  New    Target Date  08/28/18            Plan - 08/15/18 1515    Clinical Impression Statement  All STG'S met. Patient plans to purchase or find a cane to assist walking. Pain flared easily during session with exercises.  Pain 4/10 at end of session.  he plans to use ice at home.  I encouraged him to  use assistive device to decrease pain ( pain increased with walking in grocery store ,  able to walk 30 minutes with 7/10 pain  PT Next Visit Plan  gradual strengthening review prone hip extension exercses,  supine ball squeeze, bridges.    PT Home Exercise Plan  ITB stretch, adductor stretch, sidelying clamshells, bridges, sit to stands, SLR, pelvic tilts,  hip flexor stretch,  warrior pose for anterior hip stretch,  prone quad set,  prone hip extension.    Consulted and Agree with Plan of Care  Patient       Patient will benefit from skilled therapeutic intervention in order to improve the following deficits and impairments:     Visit Diagnosis: Pain in right hip  Other abnormalities of gait and mobility  Muscle weakness  (generalized)     Problem List Patient Active Problem List   Diagnosis Date Noted  . Stress 07/16/2018  . History of hip replacement 08/24/2017  . Primary osteoarthritis of right hip 07/11/2017  . Coronary artery disease 04/25/2017  . Seasonal allergies 04/25/2017  . Hyperlipidemia LDL goal <70 03/25/2016  . Cardiomyopathy, ischemic 03/25/2016  . ST elevation (STEMI) myocardial infarction involving left circumflex coronary artery ([place]) 03/16/2016  . Essential hypertension 03/09/2016  . Decreased visual acuity 03/09/2016  . Dental decay 03/09/2016    Terrilee Dudzik PTA 08/15/2018, 4:03 PM  [facility] [address] [place], [place], [address] Phone: [phone]   Fax:  [phone]  Name: Kenneth [place] MRN: [id number] Date of Birth: 30-Nov-1966

## 2018-08-17 ENCOUNTER — Encounter: Payer: Self-pay | Admitting: Physical Therapy

## 2018-08-17 ENCOUNTER — Ambulatory Visit: Payer: Self-pay | Admitting: Physical Therapy

## 2018-08-17 DIAGNOSIS — M6281 Muscle weakness (generalized): Secondary | ICD-10-CM

## 2018-08-17 DIAGNOSIS — R2689 Other abnormalities of gait and mobility: Secondary | ICD-10-CM

## 2018-08-17 DIAGNOSIS — M25551 Pain in right hip: Secondary | ICD-10-CM

## 2018-08-17 NOTE — Therapy (Signed)
[place], [place], [address] Phone: [phone]   Fax:  [phone]  Physical Therapy Treatment  Patient Details  Name: MARSHAWN NORMOYLE MRN: [id number] Date of Birth: [birth year] Referring Provider: Leandrew Koyanagi, MD   Encounter Date: 08/17/2018  PT End of Session - 08/17/18 1757    Visit Number  8    Number of Visits  13    Date for PT Re-Evaluation  08/28/18    PT Start Time  1418    PT Stop Time  1458    PT Time Calculation (min)  40 min    Activity Tolerance  Patient tolerated treatment well    Behavior During Therapy  [facility] for tasks assessed/performed       Past Medical History:  Diagnosis Date  . Arthritis   . Asthma   . Coronary artery disease   . Dyspnea    "sometimes" with exertion   . Headache   . Heart murmur   . Hypertension   . Sleep apnea    no CPAP  . ST elevation (STEMI) myocardial infarction involving left circumflex coronary artery ([place]) 03/16/2016   DES CFX  . Umbilical hernia     Past Surgical History:  Procedure Laterality Date  . CARDIAC CATHETERIZATION N/A 03/16/2016   Procedure: Left Heart Cath and Coronary Angiography;  Surgeon: Peter M Martinique, MD;  LAD OK, CFX 100%, OM1 20%, RCA 20%, EF nl  . CARDIAC CATHETERIZATION N/A 03/16/2016   Procedure: Coronary Stent Intervention;  Surgeon: Peter M Martinique, MD; PROMUS PREM MR 3.0X16 mm DES   . TOTAL HIP ARTHROPLASTY Right 08/24/2017   Procedure: RIGHT TOTAL HIP ARTHROPLASTY ANTERIOR APPROACH;  Surgeon: Leandrew Koyanagi, MD;  Location: [place];  Service: Orthopedics;  Laterality: Right;  RIGHT TOTAL HIP ARTHROPLASTY ANTERIOR APPROACH  . UMBILICAL HERNIA REPAIR  3428   umbilical hernia repair    There were no vitals filed for this visit.  Subjective Assessment - 08/17/18 1422    Subjective  Foot swollen when I got up this morning.  Bottomof foot felt like pins and needles.  I have been doing the exercises they hurt and then I  stop.    Currently in Pain?  Yes    Pain Score  6     Pain Location  Hip    Pain Orientation  Right;Anterior    Pain Descriptors / Indicators  Aching;Sore    Pain Radiating Towards  to groin    Pain Frequency  Intermittent    Aggravating Factors   as on last visit    Pain Relieving Factors  as on last visit                       [place] [facility] PT Treatment/Exercise - 08/17/18 0001      Ambulation/Gait   Gait Comments  practiced with Single cane able to decrease pain.       Self-Care   Other Self-Care Comments   cane,   Avoid a lot of walking,  Take shorter steps,  Be sure to stretch,  Anatomy how tightness effects.  Use of ice     Knee/Hip Exercises: Stretches   Hip Flexor Stretch  3 reps;10 seconds   7/10 pain   Gastroc Stretch  3 reps;30 seconds   incline board.     Knee/Hip Exercises: Aerobic   Nustep  L1 5 minutes UE/LE      Knee/Hip Exercises: Supine  Other Supine Knee/Hip Exercises  hip flexion AA   7/10 pain      Kinesiotix   Edema  proximal thigh    Inhibit Muscle   anterior hip             PT Education - 08/17/18 1434    Education Details  HEP  Tape    Person(s) Educated  Patient    Methods  Explanation;Demonstration;Verbal cues;Handout    Comprehension  Verbalized understanding;Returned demonstration       PT Short Term Goals - 08/15/18 1513      PT SHORT TERM GOAL #1   Title  Pt will be I with HEP    Baseline  yes    Time  3    Period  Weeks    Status  Achieved      PT SHORT TERM GOAL #2   Title  Pt will verbalize techniques to decrease pain in hip in order to increase quality of life    Baseline  able to list 4 things to help pain.     Time  3    Period  Weeks    Status  Achieved        PT Long Term Goals - 08/17/18 1449      PT LONG TERM GOAL #3   Title  Pt will decrease FOTO score to </= 45% limited to improve function and quality of life    Time  6    Period  Weeks    Status  Unable to assess      PT LONG TERM  GOAL #4   Title  Pt will be able to stand and walk >/= 60 minutes to meet pt's goals of returning to work and playing basketball    Baseline  30    Time  6    Period  Weeks    Status  On-going            Plan - 08/17/18 1758    Clinical Impression Statement  Patient 's pain continues.  He can get hip to 85 degrees and with 7/10 pain.  Gait with cane was able to decrease pain. Trial of tape for pain. he declined the need for modalities.    PT Next Visit Plan  assess tape,  consider [place]??  Ice massage?    PT Home Exercise Plan  ITB stretch, adductor stretch, sidelying clamshells, bridges, sit to stands, SLR, pelvic tilts,  hip flexor stretch,  warrior pose for anterior hip stretch,  prone quad set,  prone hip extension.    Consulted and Agree with Plan of Care  Patient       Patient will benefit from skilled therapeutic intervention in order to improve the following deficits and impairments:     Visit Diagnosis: Pain in right hip  Other abnormalities of gait and mobility  Muscle weakness (generalized)     Problem List Patient Active Problem List   Diagnosis Date Noted  . Stress 07/16/2018  . History of hip replacement 08/24/2017  . Primary osteoarthritis of right hip 07/11/2017  . Coronary artery disease 04/25/2017  . Seasonal allergies 04/25/2017  . Hyperlipidemia LDL goal <70 03/25/2016  . Cardiomyopathy, ischemic 03/25/2016  . ST elevation (STEMI) myocardial infarction involving left circumflex coronary artery ([place]) 03/16/2016  . Essential hypertension 03/09/2016  . Decreased visual acuity 03/09/2016  . Dental decay 03/09/2016    Tyller Bowlby 08/17/2018, 6:02 PM  [facility] [place],  [place], [address] Phone: [phone]   Fax:  [phone]  Name: MAYA SCHOLER MRN: [id number] Date of Birth: [birth year]

## 2018-08-17 NOTE — Patient Instructions (Addendum)
Gastroc Stretch    Stand with right foot back, leg straight, forward leg bent. Keeping heel on floor, turned slightly out, lean into wall until stretch is felt in calf. Hold _30___ seconds. Repeat __3__ times per set. Do ___1_ sets per session. Do __1__ sessions per day.Pain free.,  Gentle.  http://orth.exer.us/26   Copyright  VHI. All rights reserved.

## 2018-08-17 NOTE — Therapy (Signed)
[place], [place], [address] Phone: [phone]   Fax:  [phone]  Physical Therapy Treatment  Patient Details  Name: Kenneth Long MRN: [id number] Date of Birth: 10-01-1966 Referring Provider: Leandrew Koyanagi, MD   Encounter Date: 08/17/2018  PT End of Session - 08/17/18 1757    Visit Number  8    Number of Visits  13    Date for PT Re-Evaluation  08/28/18    PT Start Time  1418    PT Stop Time  1458    PT Time Calculation (min)  40 min    Activity Tolerance  Patient tolerated treatment well    Behavior During Therapy  [facility] for tasks assessed/performed       Past Medical History:  Diagnosis Date  . Arthritis   . Asthma   . Coronary artery disease   . Dyspnea    "sometimes" with exertion   . Headache   . Heart murmur   . Hypertension   . Sleep apnea    no CPAP  . ST elevation (STEMI) myocardial infarction involving left circumflex coronary artery ([place]) 03/16/2016   DES CFX  . Umbilical hernia     Past Surgical History:  Procedure Laterality Date  . CARDIAC CATHETERIZATION N/A 03/16/2016   Procedure: Left Heart Cath and Coronary Angiography;  Surgeon: Peter M Martinique, MD;  LAD OK, CFX 100%, OM1 20%, RCA 20%, EF nl  . CARDIAC CATHETERIZATION N/A 03/16/2016   Procedure: Coronary Stent Intervention;  Surgeon: Peter M Martinique, MD; PROMUS PREM MR 3.0X16 mm DES   . TOTAL HIP ARTHROPLASTY Right 08/24/2017   Procedure: RIGHT TOTAL HIP ARTHROPLASTY ANTERIOR APPROACH;  Surgeon: Leandrew Koyanagi, MD;  Location: [place];  Service: Orthopedics;  Laterality: Right;  RIGHT TOTAL HIP ARTHROPLASTY ANTERIOR APPROACH  . UMBILICAL HERNIA REPAIR  5732   umbilical hernia repair    There were no vitals filed for this visit.  Subjective Assessment - 08/17/18 1422    Subjective  Foot swollen when I got up this morning.  Bottomof foot felt like pins and needles.  I have been doing the exercises they hurt and then I  stop.    Currently in Pain?  Yes    Pain Score  6     Pain Location  Hip    Pain Orientation  Right;Anterior    Pain Descriptors / Indicators  Aching;Sore    Pain Radiating Towards  to groin    Pain Frequency  Intermittent    Aggravating Factors   as on last visit    Pain Relieving Factors  as on last visit                       [place] Adult PT Treatment/Exercise - 08/17/18 0001      Ambulation/Gait   Gait Comments  practiced with Single cane able to decrease pain.       Self-Care   Other Self-Care Comments   cane,        Knee/Hip Exercises: Stretches   Hip Flexor Stretch  3 reps;10 seconds   7/10 pain   Gastroc Stretch  3 reps;30 seconds   incline board.     Knee/Hip Exercises: Aerobic   Nustep  L1 5 minutes UE/LE      Knee/Hip Exercises: Supine   Other Supine Knee/Hip Exercises  hip flexion AA   7/10 pain      Kinesiotix   Edema  proximal thigh    Inhibit Muscle   anterior hip             PT Education - 08/17/18 1434    Education Details  HEP  Tape    Person(s) Educated  Patient    Methods  Explanation;Demonstration;Verbal cues;Handout    Comprehension  Verbalized understanding;Returned demonstration       PT Short Term Goals - 08/15/18 1513      PT SHORT TERM GOAL #1   Title  Pt will be I with HEP    Baseline  yes    Time  3    Period  Weeks    Status  Achieved      PT SHORT TERM GOAL #2   Title  Pt will verbalize techniques to decrease pain in hip in order to increase quality of life    Baseline  able to list 4 things to help pain.     Time  3    Period  Weeks    Status  Achieved        PT Long Term Goals - 08/17/18 1449      PT LONG TERM GOAL #3   Title  Pt will decrease FOTO score to </= 45% limited to improve function and quality of life    Time  6    Period  Weeks    Status  Unable to assess      PT LONG TERM GOAL #4   Title  Pt will be able to stand and walk >/= 60 minutes to meet pt's goals of returning to work  and playing basketball    Baseline  30    Time  6    Period  Weeks    Status  On-going            Plan - 08/17/18 1758    Clinical Impression Statement  Patient 's pain continues.  He can get hip to 85 degrees and with 7/10 pain.  Gait with cane was able to decrease pain. Trial of tape for pain. he declined the need for modalities.    PT Next Visit Plan  assess tape,  consider [place]??  Ice massage?    PT Home Exercise Plan  ITB stretch, adductor stretch, sidelying clamshells, bridges, sit to stands, SLR, pelvic tilts,  hip flexor stretch,  warrior pose for anterior hip stretch,  prone quad set,  prone hip extension.    Consulted and Agree with Plan of Care  Patient       Patient will benefit from skilled therapeutic intervention in order to improve the following deficits and impairments:     Visit Diagnosis: Pain in right hip  Other abnormalities of gait and mobility  Muscle weakness (generalized)     Problem List Patient Active Problem List   Diagnosis Date Noted  . Stress 07/16/2018  . History of hip replacement 08/24/2017  . Primary osteoarthritis of right hip 07/11/2017  . Coronary artery disease 04/25/2017  . Seasonal allergies 04/25/2017  . Hyperlipidemia LDL goal <70 03/25/2016  . Cardiomyopathy, ischemic 03/25/2016  . ST elevation (STEMI) myocardial infarction involving left circumflex coronary artery ([place]) 03/16/2016  . Essential hypertension 03/09/2016  . Decreased visual acuity 03/09/2016  . Dental decay 03/09/2016    Shaakira Borrero PTA 08/17/2018, 6:04 PM  [facility] [address] [place], [place], [address] Phone: [phone]   Fax:  [phone]  Name: Kenneth Long MRN: [id number] Date of Birth: 06-30-1966

## 2018-08-18 NOTE — Progress Notes (Signed)
Cardiology Office Note   Date:  08/22/2018   ID:  Kenneth Long, DOB [birth year], MRN [id number]  PCP:  Mack Hook, MD  Cardiologist:   Peter Martinique, MD   Chief Complaint  Patient presents with  . Coronary Artery Disease      History of Present Illness: Kenneth Long is a 52 y.o. male who presents for follow up CAD with STEMI. He was admitted in March 2017 with an inferior STEMI. He underwent cardiac cath which demonstrated occlusion of the second OM. Otherwise nonobstructive CAD. The OM was stented with a DES. EF by cath was low normal. Echo showed EF 40-45% without regional WMA.  He also has a history of HTN, asthma, and tobacco abuse.  Brilinta was stopped one year post MI.   He underwent right THR in September 3570 without complications.   On follow up today he states he is generally doing well. Since early July he has noted symptoms of chest pain when he is outside in the hot sun. If he sits and rests it goes away. Has not used sl Ntg. Tends to stay inside more now because of this. Saw Dr. Amil Amen in July and Ecg was unchanged. He has persistent symptoms intermittently since then. States it feels like heartburn but this is also what his MI felt like only this pain isn't as bad. He is still doing rehab for his hip.    Past Medical History:  Diagnosis Date  . Arthritis   . Asthma   . Coronary artery disease   . Dyspnea    "sometimes" with exertion   . Headache   . Heart murmur   . Hypertension   . Sleep apnea    no CPAP  . ST elevation (STEMI) myocardial infarction involving left circumflex coronary artery ([place]) 03/16/2016   DES CFX  . Umbilical hernia     Past Surgical History:  Procedure Laterality Date  . CARDIAC CATHETERIZATION N/A 03/16/2016   Procedure: Left Heart Cath and Coronary Angiography;  Surgeon: Peter M Martinique, MD;  LAD OK, CFX 100%, OM1 20%, RCA 20%, EF nl  . CARDIAC CATHETERIZATION N/A 03/16/2016   Procedure: Coronary Stent  Intervention;  Surgeon: Peter M Martinique, MD; PROMUS PREM MR 3.0X16 mm DES   . TOTAL HIP ARTHROPLASTY Right 08/24/2017   Procedure: RIGHT TOTAL HIP ARTHROPLASTY ANTERIOR APPROACH;  Surgeon: Leandrew Koyanagi, MD;  Location: [place];  Service: Orthopedics;  Laterality: Right;  RIGHT TOTAL HIP ARTHROPLASTY ANTERIOR APPROACH  . UMBILICAL HERNIA REPAIR  1779   umbilical hernia repair     Current Outpatient Medications  Medication Sig Dispense Refill  . albuterol (PROVENTIL HFA;VENTOLIN HFA) 108 (90 Base) MCG/ACT inhaler Inhale 2 puffs into the lungs every 6 (six) hours as needed for wheezing or shortness of breath. 1 Inhaler 0  . amLODipine (NORVASC) 10 MG tablet Take 1 tablet (10 mg total) by mouth daily. 30 tablet 11  . aspirin EC 81 MG tablet Take 1 tablet (81 mg total) by mouth daily. 90 tablet 3  . atorvastatin (LIPITOR) 80 MG tablet Take 1 tablet (80 mg total) by mouth daily. 30 tablet 7  . carvedilol (COREG) 25 MG tablet Take 1 tablet (25 mg total) by mouth 2 (two) times daily. 180 tablet 3  . cetirizine (ZYRTEC) 10 MG tablet Take 1 tablet (10 mg total) by mouth daily. 30 tablet 11  . ciprofloxacin (CIPRO) 500 MG tablet Take 1 tablet (500 mg total) by mouth 2 (two)  times daily. 60 tablet 0  . doxycycline (VIBRA-TABS) 100 MG tablet 1 tab by mouth twice daily for 10 days 20 tablet 0  . lisinopril-hydrochlorothiazide (PRINZIDE,ZESTORETIC) 20-25 MG tablet 1 tab by mouth daily 30 tablet 11  . mometasone (NASONEX) 50 MCG/ACT nasal spray 2 sprays each nostril daily 17 g 12  . nitroGLYCERIN (NITROSTAT) 0.4 MG SL tablet Place 1 tablet (0.4 mg total) under the tongue every 5 (five) minutes as needed for chest pain. 25 tablet 12  . Omega-3 Fatty Acids (FISH OIL) 1000 MG CAPS 1000 mg twice daily  0  . tamsulosin (FLOMAX) 0.4 MG CAPS capsule 1 cap by mouth at bedtime. 30 capsule 11   No current facility-administered medications for this visit.     Allergies:   Peanut-containing drug products and Shellfish  allergy    Social History:  The patient  reports that he quit smoking about 2 years ago. His smoking use included cigarettes. He has never used smokeless tobacco. He reports that he drinks alcohol. He reports that he does not use drugs.   Family History:  The patient's family history includes Hypertension in his father, mother, sister, and sister; Sarcoidosis in his mother.    ROS:  Please see the history of present illness.   Otherwise, review of systems are positive for none.   All other systems are reviewed and negative.    PHYSICAL EXAM: VS:  BP 126/82 (BP Location: Left Arm, Patient Position: Sitting, Cuff Size: Normal)   Pulse 84   Ht 5\' 10"  (1.778 m)   Wt 173 lb (78.5 kg)   BMI 24.82 kg/m  , BMI Body mass index is 24.82 kg/m.  GENERAL:  Well appearing BM in NAD HEENT:  PERRL, EOMI, sclera are clear. Oropharynx is clear. NECK:  No jugular venous distention, carotid upstroke brisk and symmetric, no bruits, no thyromegaly or adenopathy LUNGS:  Clear to auscultation bilaterally CHEST:  Unremarkable HEART:  RRR,  PMI not displaced or sustained,S1 and S2 within normal limits, no S3, no S4: no clicks, no rubs, no murmurs ABD:  Soft, nontender. BS +, no masses or bruits. No hepatomegaly, no splenomegaly EXT:  2 + pulses throughout, no edema, no cyanosis no clubbing SKIN:  Warm and dry.  No rashes NEURO:  Alert and oriented x 3. Cranial nerves II through XII intact. PSYCH:  Cognitively intact       Recent Labs: 08/25/2017: Hemoglobin 9.6; Platelets 276 07/10/2018: ALT 28; BUN 11; Creatinine, Ser 1.07; Potassium 3.5; Sodium 144    Lipid Panel    Component Value Date/Time   CHOL 149 07/10/2018 0911   TRIG 705 (HH) 07/10/2018 0911   HDL 32 (L) 07/10/2018 0911   CHOLHDL 6.5 03/17/2016 0407   VLDL 76 (H) 03/17/2016 0407   LDLCALC Comment 07/10/2018 0911      Wt Readings from Last 3 Encounters:  08/22/18 173 lb (78.5 kg)  07/14/18 169 lb (76.7 kg)  04/10/18 165 lb (74.8  kg)      Other studies Reviewed: Echo: 03/17/16: Study Conclusions  - Left ventricle: Posterior and lateral wall hypokinesis The cavity   size was moderately dilated. Systolic function was mildly to   moderately reduced. The estimated ejection fraction was in the   range of 40% to 45%. Wall motion was normal; there were no   regional wall motion abnormalities. - Mitral valve: There was mild regurgitation. - Left atrium: The atrium was mildly dilated. - Atrial septum: No defect or patent foramen ovale  was identified.   Cardiac cath 03/16/16: Conclusion    Prox RCA to Mid RCA lesion, 20% stenosed.  1st Mrg lesion, 20% stenosed.  The left ventricular systolic function is normal.  2nd Mrg lesion, 100% stenosed. Post intervention, there is a 0% residual stenosis.   1. Single vessel occlusive CAD 2. Low normal LV function 3. Successful stenting of the second OM with a DES  Plan: DAPT for one year. Patient may be a candidate for fast track discharge if no complications.       ASSESSMENT AND PLAN:  1. CAD s/p STEMI in March 2017 with DES of OM. He is experiencing some chest pain similar to MI but not as severe and somewhat atypical. Will arrange for a Lexiscan myoview study.  Continue ASA, statin, beta blocker.    2. Hyperlipidemia- mixed. On high dose statin and fish oil. Gemfibrozil not covered by insurance.  Focus on healthy eating and weight loss.  3. HTN improved with switch to Coreg. Continue current therapy.    Disposition:   FU with me in 6 months  Signed, Peter Martinique, MD  08/22/2018 2:13 PM    [facility] [address] [place], [place], [address] Phone [phone], Fax [phone]

## 2018-08-22 ENCOUNTER — Ambulatory Visit: Payer: Self-pay | Admitting: Physical Therapy

## 2018-08-22 ENCOUNTER — Encounter

## 2018-08-22 ENCOUNTER — Encounter: Payer: Self-pay | Admitting: Cardiology

## 2018-08-22 ENCOUNTER — Ambulatory Visit: Payer: No Typology Code available for payment source | Admitting: Cardiology

## 2018-08-22 VITALS — BP 126/82 | HR 84 | Ht 70.0 in | Wt 173.0 lb

## 2018-08-22 DIAGNOSIS — R079 Chest pain, unspecified: Secondary | ICD-10-CM

## 2018-08-22 DIAGNOSIS — I2510 Atherosclerotic heart disease of native coronary artery without angina pectoris: Secondary | ICD-10-CM

## 2018-08-22 DIAGNOSIS — I10 Essential (primary) hypertension: Secondary | ICD-10-CM

## 2018-08-22 DIAGNOSIS — E785 Hyperlipidemia, unspecified: Secondary | ICD-10-CM

## 2018-08-22 NOTE — Patient Instructions (Signed)
Continue your current therapy  We will schedule you for a nuclear stress test

## 2018-08-24 ENCOUNTER — Encounter: Payer: Self-pay | Admitting: Physical Therapy

## 2018-08-24 ENCOUNTER — Ambulatory Visit: Payer: Self-pay | Attending: Orthopaedic Surgery | Admitting: Physical Therapy

## 2018-08-24 DIAGNOSIS — R2689 Other abnormalities of gait and mobility: Secondary | ICD-10-CM

## 2018-08-24 DIAGNOSIS — M25551 Pain in right hip: Secondary | ICD-10-CM

## 2018-08-24 DIAGNOSIS — M6281 Muscle weakness (generalized): Secondary | ICD-10-CM

## 2018-08-24 NOTE — Therapy (Signed)
[place], [place], [address] Phone: [phone]   Fax:  [phone]  Physical Therapy Treatment  Patient Details  Name: Kenneth Long MRN: [id number] Date of Birth: 03-13-1966 Referring Provider: Leandrew Koyanagi, MD   Encounter Date: 08/24/2018  PT End of Session - 08/24/18 1505    Visit Number  9    Number of Visits  13    Date for PT Re-Evaluation  08/28/18    PT Start Time  1505    PT Stop Time  1543    PT Time Calculation (min)  38 min    Activity Tolerance  Patient tolerated treatment well    Behavior During Therapy  [facility] for tasks assessed/performed       Past Medical History:  Diagnosis Date  . Arthritis   . Asthma   . Coronary artery disease   . Dyspnea    "sometimes" with exertion   . Headache   . Heart murmur   . Hypertension   . Sleep apnea    no CPAP  . ST elevation (STEMI) myocardial infarction involving left circumflex coronary artery ([place]) 03/16/2016   DES CFX  . Umbilical hernia     Past Surgical History:  Procedure Laterality Date  . CARDIAC CATHETERIZATION N/A 03/16/2016   Procedure: Left Heart Cath and Coronary Angiography;  Surgeon: Peter M Martinique, MD;  LAD OK, CFX 100%, OM1 20%, RCA 20%, EF nl  . CARDIAC CATHETERIZATION N/A 03/16/2016   Procedure: Coronary Stent Intervention;  Surgeon: Peter M Martinique, MD; PROMUS PREM MR 3.0X16 mm DES   . TOTAL HIP ARTHROPLASTY Right 08/24/2017   Procedure: RIGHT TOTAL HIP ARTHROPLASTY ANTERIOR APPROACH;  Surgeon: Leandrew Koyanagi, MD;  Location: [place];  Service: Orthopedics;  Laterality: Right;  RIGHT TOTAL HIP ARTHROPLASTY ANTERIOR APPROACH  . UMBILICAL HERNIA REPAIR  0272   umbilical hernia repair    There were no vitals filed for this visit.  Subjective Assessment - 08/24/18 1505    Subjective  "The pain was really bad on Tuesday because I saw the MD before coming"    Currently in Pain?  Yes    Pain Score  5     Pain Location   Hip    Pain Orientation  Right    Pain Descriptors / Indicators  Aching;Sore    Pain Type  Chronic pain    Pain Radiating Towards  to groin.     Pain Onset  More than a month ago    Pain Frequency  Intermittent    Aggravating Factors   walking alot,     Pain Relieving Factors  soaking in the tube                       [facility] Adult PT Treatment/Exercise - 08/24/18 0001      Knee/Hip Exercises: Stretches   Hip Flexor Stretch  2 reps;30 seconds    Other Knee/Hip Stretches  continued warrior pose 2 x 30 sec hold      Knee/Hip Exercises: Aerobic   Nustep  L5 x 5 min LE only      Knee/Hip Exercises: Sidelying   Clams  2 x 10 in L sidelying      Manual Therapy   Manual Therapy  Soft tissue mobilization    Manual therapy comments  skilled palpation throughout TPDN     Soft tissue mobilization  IASTM along adductor longus  Trigger Point Dry Needling - 08/24/18 1512    Consent Given?  Yes    Education Handout Provided  Yes    Muscles Treated Lower Body  Adductor longus/brevius/maximus    Adductor Response  Twitch response elicited;Palpable increased muscle length   L longus           PT Education - 08/24/18 1510    Education Details  muscle anatomy and referral patterns, what TPDN is, benefis and what to expect. reviewed and updated HEP for adductor stretch and clam shell discussed that burning sensation when during exercsie is okay and not causing damage    Person(s) Educated  Patient    Methods  Explanation;Verbal cues;Handout    Comprehension  Verbalized understanding;Verbal cues required       PT Short Term Goals - 08/15/18 1513      PT SHORT TERM GOAL #1   Title  Pt will be I with HEP    Baseline  yes    Time  3    Period  Weeks    Status  Achieved      PT SHORT TERM GOAL #2   Title  Pt will verbalize techniques to decrease pain in hip in order to increase quality of life    Baseline  able to list 4 things to help pain.     Time  3     Period  Weeks    Status  Achieved        PT Long Term Goals - 08/17/18 1449      PT LONG TERM GOAL #3   Title  Pt will decrease FOTO score to </= 45% limited to improve function and quality of life    Time  6    Period  Weeks    Status  Unable to assess      PT LONG TERM GOAL #4   Title  Pt will be able to stand and walk >/= 60 minutes to meet pt's goals of returning to work and playing basketball    Baseline  30    Time  6    Period  Weeks    Status  On-going            Plan - 08/24/18 1543    Clinical Impression Statement  pt reports continued pain and soreness located along the adductor longus. educated and pt provided consent for TPDN along the l adductor longus followed with IASTM techniques. continued hp strengthening/ stretching. reinforced benefit of exercise with intermittent cues during treatment. end of session he reported pain decreased and he declined modalities.     PT Treatment/Interventions  ADLs/Self Care Home Management;Cryotherapy;Electrical Stimulation;Iontophoresis 4mg /ml Dexamethasone;Moist Heat;Ultrasound;Gait training;Stair training;Functional mobility training;Therapeutic activities;Therapeutic exercise;Balance training;Neuromuscular re-education;Patient/family education;Manual techniques;Passive range of motion;Dry needling;Energy conservation;Taping    PT Next Visit Plan  response to DN, continued hp strengtheing, consider [place]??  Ice massage?    PT Home Exercise Plan  ITB stretch, adductor stretch, sidelying clamshells, bridges, sit to stands, SLR, pelvic tilts,  hip flexor stretch,  warrior pose for anterior hip stretch,  prone quad set,  prone hip extension.    Consulted and Agree with Plan of Care  Patient       Patient will benefit from skilled therapeutic intervention in order to improve the following deficits and impairments:  Abnormal gait, Pain, Decreased mobility, Postural dysfunction, Decreased activity tolerance, Decreased endurance, Decreased  strength, Decreased range of motion, Hypomobility, Decreased balance, Difficulty walking, Impaired flexibility  Visit Diagnosis: Pain in  right hip  Other abnormalities of gait and mobility  Muscle weakness (generalized)     Problem List Patient Active Problem List   Diagnosis Date Noted  . Stress 07/16/2018  . History of hip replacement 08/24/2017  . Primary osteoarthritis of right hip 07/11/2017  . Coronary artery disease 04/25/2017  . Seasonal allergies 04/25/2017  . Hyperlipidemia LDL goal <70 03/25/2016  . Cardiomyopathy, ischemic 03/25/2016  . ST elevation (STEMI) myocardial infarction involving left circumflex coronary artery ([place]) 03/16/2016  . Essential hypertension 03/09/2016  . Decreased visual acuity 03/09/2016  . Dental decay 03/09/2016   Starr Lake PT, DPT, LAT, ATC  08/24/18  3:46 PM      [facility] [address] [place], [place], [address] Phone: [phone]   Fax:  [phone]  Name: JAELIN FACKLER MRN: [id number] Date of Birth: [birth year]

## 2018-08-25 ENCOUNTER — Telehealth (HOSPITAL_COMMUNITY): Payer: Self-pay

## 2018-08-25 NOTE — Telephone Encounter (Signed)
Encounter complete. 

## 2018-08-29 ENCOUNTER — Ambulatory Visit: Payer: Self-pay | Admitting: Physical Therapy

## 2018-08-29 ENCOUNTER — Encounter: Payer: Self-pay | Admitting: Physical Therapy

## 2018-08-29 DIAGNOSIS — M6281 Muscle weakness (generalized): Secondary | ICD-10-CM

## 2018-08-29 DIAGNOSIS — M25551 Pain in right hip: Secondary | ICD-10-CM

## 2018-08-29 DIAGNOSIS — R2689 Other abnormalities of gait and mobility: Secondary | ICD-10-CM

## 2018-08-29 NOTE — Therapy (Signed)
[place], [place], [address] Phone: [phone]   Fax:  [phone]  Physical Therapy Treatment / Re-certification  Patient Details  Name: Kenneth Long MRN: [id number] Date of Birth: 1966-10-09 Referring Provider: Leandrew Koyanagi, MD   Encounter Date: 08/29/2018  PT End of Session - 08/29/18 1542    Visit Number  10    Number of Visits  14    Date for PT Re-Evaluation  09/26/18    PT Start Time  5916    PT Stop Time  1631    PT Time Calculation (min)  48 min    Activity Tolerance  Patient tolerated treatment well    Behavior During Therapy  [facility] for tasks assessed/performed       Past Medical History:  Diagnosis Date  . Arthritis   . Asthma   . Coronary artery disease   . Dyspnea    "sometimes" with exertion   . Headache   . Heart murmur   . Hypertension   . Sleep apnea    no CPAP  . ST elevation (STEMI) myocardial infarction involving left circumflex coronary artery ([place]) 03/16/2016   DES CFX  . Umbilical hernia     Past Surgical History:  Procedure Laterality Date  . CARDIAC CATHETERIZATION N/A 03/16/2016   Procedure: Left Heart Cath and Coronary Angiography;  Surgeon: Peter M Martinique, MD;  LAD OK, CFX 100%, OM1 20%, RCA 20%, EF nl  . CARDIAC CATHETERIZATION N/A 03/16/2016   Procedure: Coronary Stent Intervention;  Surgeon: Peter M Martinique, MD; PROMUS PREM MR 3.0X16 mm DES   . TOTAL HIP ARTHROPLASTY Right 08/24/2017   Procedure: RIGHT TOTAL HIP ARTHROPLASTY ANTERIOR APPROACH;  Surgeon: Leandrew Koyanagi, MD;  Location: [facility];  Service: Orthopedics;  Laterality: Right;  RIGHT TOTAL HIP ARTHROPLASTY ANTERIOR APPROACH  . UMBILICAL HERNIA REPAIR  3846   umbilical hernia repair    There were no vitals filed for this visit.  Subjective Assessment - 08/29/18 1542    Subjective  "I am feeling pretty good, I did do too much today because I was so busy yesterday"     Patient Stated Goals  get back  to playing basketball and working    Currently in Pain?  Yes    Pain Score  4     Pain Location  Hip    Pain Orientation  Right;Anterior;Medial    Pain Descriptors / Indicators  Aching;Sore    Pain Type  Chronic pain    Pain Onset  More than a month ago    Pain Frequency  Intermittent    Aggravating Factors   walking alot, prolonged riding in a car,     Pain Relieving Factors  soaking in the tub, icing, stretch         [facility] PT Assessment - 08/29/18 1548      Observation/Other Assessments   Focus on Therapeutic Outcomes (FOTO)   61% limited      AROM   Right Hip Extension  5   pain at end range of availble motion   Right Hip Flexion  84   pain during movement   Right Hip External Rotation   22   pain during movement   Right Hip Internal Rotation   26   pain during movement   Right Hip ABduction  20   pain during movement, assessed in supine     Strength   Right Hip Flexion  4-/5  with in availble ROM, pain during testing   Right Hip Extension  3/5   in available ROM   Right Hip External Rotation   3+/5    Right Hip Internal Rotation  3+/5    Right Hip ABduction  3/5   pain during testing                  [facility] Adult PT Treatment/Exercise - 08/29/18 1603      Knee/Hip Exercises: Stretches   Hip Flexor Stretch  2 reps;Right;30 seconds   in standing with added isometric glute contraction   Gastroc Stretch  2 reps;30 seconds    Other Knee/Hip Stretches  continued warrior pose 2 x 30 sec hold      Knee/Hip Exercises: Aerobic   Nustep  L5 x 6 min LE only      Knee/Hip Exercises: Machines for Strengthening   Total Gym Leg Press  2 x 12 bil LE 20#   pushing throuhg bil LE     Knee/Hip Exercises: Standing   Heel Raises  1 set;Both;20 reps    Hip Abduction  2 sets;10 reps;Knee straight    Hip Extension  2 sets;10 reps;Knee straight             PT Education - 08/29/18 1737    Education Details  updated HEP for standing hip. discussed POC for 1  x a week for 4 weeks which at that point in time we plan to discharge or refer back to his MD if he continues to have pain and limited mobility, and our goal is to continue with functional strength and activities.     Person(s) Educated  Patient    Methods  Explanation;Verbal cues;Handout;Demonstration    Comprehension  Verbalized understanding;Verbal cues required;Returned demonstration       PT Short Term Goals - 08/15/18 1513      PT SHORT TERM GOAL #1   Title  Pt will be I with HEP    Baseline  yes    Time  3    Period  Weeks    Status  Achieved      PT SHORT TERM GOAL #2   Title  Pt will verbalize techniques to decrease pain in hip in order to increase quality of life    Baseline  able to list 4 things to help pain.     Time  3    Period  Weeks    Status  Achieved        PT Long Term Goals - 08/29/18 1726      PT LONG TERM GOAL #1   Title  Pt will demonstrate increased R hip strength >/= 4/5 to decrease pain to </= 2/10 and improve functional mobility    Time  6    Period  Weeks    Status  On-going    Target Date  09/26/18      PT LONG TERM GOAL #2   Title  Pt will increase R hip flexion AROM to >/= 90 degrees and R hip extension AROM to >/= 15 degree to improve functional mobility     Time  6    Period  Weeks    Status  On-going    Target Date  09/26/18      PT LONG TERM GOAL #3   Title  Pt will decrease FOTO score to </= 45% limited to improve function and quality of life    Time  6    Period  Weeks    Status  On-going    Target Date  09/26/18      PT LONG TERM GOAL #4   Title  Pt will be able to stand and walk >/= 60 minutes to meet pt's goals of returning to work and playing basketball    Time  6    Period  Weeks    Status  On-going    Target Date  09/26/18            Plan - 08/29/18 1741    Clinical Impression Statement  [name] has demonstrated improvement in hip rom and some progression with strength, however continues to exhibit  pain in the R hip with activity. He met all STG's and is progressing toward LTG's. continued hip strengthening progressing to standing and functional activities. Plan to continue with physical therapy dropping to 1 x a week for 4 weeks to work on functional activities.     Rehab Potential  Good    PT Frequency  1x / week    PT Duration  4 weeks    PT Treatment/Interventions  ADLs/Self Care Home Management;Cryotherapy;Electrical Stimulation;Iontophoresis 21m/ml Dexamethasone;Moist Heat;Ultrasound;Gait training;Stair training;Functional mobility training;Therapeutic activities;Therapeutic exercise;Balance training;Neuromuscular re-education;Patient/family education;Manual techniques;Passive range of motion;Dry needling;Energy conservation;Taping    PT Next Visit Plan  response to DN, continued hp strengtheing, functional strengthening and activity , consider [place]     PT Home Exercise Plan  ITB stretch, adductor stretch, sidelying clamshells, bridges, sit to stands, SLR, pelvic tilts,  hip flexor stretch,  warrior pose for anterior hip stretch,  prone quad set,  prone hip extension. standing hip abduction/ extension    Consulted and Agree with Plan of Care  Patient       Patient will benefit from skilled therapeutic intervention in order to improve the following deficits and impairments:  Abnormal gait, Pain, Decreased mobility, Postural dysfunction, Decreased activity tolerance, Decreased endurance, Decreased strength, Decreased range of motion, Hypomobility, Decreased balance, Difficulty walking, Impaired flexibility  Visit Diagnosis: Pain in right hip  Other abnormalities of gait and mobility  Muscle weakness (generalized)     Problem List Patient Active Problem List   Diagnosis Date Noted  . Stress 07/16/2018  . History of hip replacement 08/24/2017  . Primary osteoarthritis of right hip 07/11/2017  . Coronary artery disease 04/25/2017  . Seasonal allergies 04/25/2017  . Hyperlipidemia  LDL goal <70 03/25/2016  . Cardiomyopathy, ischemic 03/25/2016  . ST elevation (STEMI) myocardial infarction involving left circumflex coronary artery [place] 03/16/2016  . Essential hypertension 03/09/2016  . Decreased visual acuity 03/09/2016  . Dental decay 03/09/2016   KStarr LakePT, DPT, LAT, ATC  08/29/18  5:44 PM      [facility] [address]Phone: [phone]  Fax:  [phone] Name: SINOCENTE KRACHMRN: [id number] of Birth: [birth year]

## 2018-08-30 ENCOUNTER — Ambulatory Visit (HOSPITAL_COMMUNITY): Admission: RE | Admit: 2018-08-30 | Payer: No Typology Code available for payment source | Source: Ambulatory Visit

## 2018-08-31 ENCOUNTER — Ambulatory Visit: Payer: Self-pay | Admitting: Physical Therapy

## 2018-09-08 ENCOUNTER — Encounter

## 2018-09-12 ENCOUNTER — Encounter: Payer: Self-pay | Admitting: Physical Therapy

## 2018-09-12 ENCOUNTER — Telehealth (HOSPITAL_COMMUNITY): Payer: Self-pay

## 2018-09-12 ENCOUNTER — Ambulatory Visit: Payer: Self-pay | Admitting: Physical Therapy

## 2018-09-12 DIAGNOSIS — R2689 Other abnormalities of gait and mobility: Secondary | ICD-10-CM

## 2018-09-12 DIAGNOSIS — M25551 Pain in right hip: Secondary | ICD-10-CM

## 2018-09-12 DIAGNOSIS — M6281 Muscle weakness (generalized): Secondary | ICD-10-CM

## 2018-09-12 NOTE — Therapy (Signed)
[place], [place], [address] Phone: [phone]   Fax:  [phone]  Physical Therapy Treatment  Patient Details  Name: Kenneth Long MRN: [id number] Date of Birth: 12/04/1966 Referring Provider: Leandrew Koyanagi, MD   Encounter Date: 09/12/2018  PT End of Session - 09/12/18 1424    Visit Number  11    Number of Visits  14    Date for PT Re-Evaluation  09/26/18    PT Start Time  0220    PT Stop Time  0300    PT Time Calculation (min)  40 min       Past Medical History:  Diagnosis Date  . Arthritis   . Asthma   . Coronary artery disease   . Dyspnea    "sometimes" with exertion   . Headache   . Heart murmur   . Hypertension   . Sleep apnea    no CPAP  . ST elevation (STEMI) myocardial infarction involving left circumflex coronary artery ([place]) 03/16/2016   DES CFX  . Umbilical hernia     Past Surgical History:  Procedure Laterality Date  . CARDIAC CATHETERIZATION N/A 03/16/2016   Procedure: Left Heart Cath and Coronary Angiography;  Surgeon: Peter M Martinique, MD;  LAD OK, CFX 100%, OM1 20%, RCA 20%, EF nl  . CARDIAC CATHETERIZATION N/A 03/16/2016   Procedure: Coronary Stent Intervention;  Surgeon: Peter M Martinique, MD; PROMUS PREM MR 3.0X16 mm DES   . TOTAL HIP ARTHROPLASTY Right 08/24/2017   Procedure: RIGHT TOTAL HIP ARTHROPLASTY ANTERIOR APPROACH;  Surgeon: Leandrew Koyanagi, MD;  Location: [facility];  Service: Orthopedics;  Laterality: Right;  RIGHT TOTAL HIP ARTHROPLASTY ANTERIOR APPROACH  . UMBILICAL HERNIA REPAIR  0998   umbilical hernia repair    There were no vitals filed for this visit.  Subjective Assessment - 09/12/18 1423    Subjective  I've been walking more. Walked outside 30-40 minutes yesterday.     Currently in Pain?  Yes    Pain Score  4     Pain Location  Hip    Pain Orientation  Right    Pain Descriptors / Indicators  Aching    Aggravating Factors   walling, prolonged riding  in the car, sitting     Pain Relieving Factors  soaking in the rub, icing stretching                        [facility] Adult PT Treatment/Exercise - 09/12/18 0001      Exercises   Exercises  Knee/Hip      Knee/Hip Exercises: Stretches   Gastroc Stretch  2 reps;30 seconds   slant board     Knee/Hip Exercises: Aerobic   Nustep  L5 x 6 min LE only      Knee/Hip Exercises: Machines for Strengthening   Total Gym Leg Press  2 x 12 bil LE 20#   pushing throuhg bil LE     Knee/Hip Exercises: Standing   Heel Raises  1 set;Both;20 reps    Hip Abduction  2 sets;10 reps;Knee straight    Hip Extension  2 sets;10 reps;Knee straight    Forward Step Up  10 reps;Step Height: 6";Hand Hold: 1;Right      Knee/Hip Exercises: Supine   Bridges with Clamshell  10 reps   red   Other Supine Knee/Hip Exercises  red band clam x 20 then unilatera      Knee/Hip  Exercises: Sidelying   Clams  2 x 10 R/L  sidelying               PT Short Term Goals - 08/15/18 1513      PT SHORT TERM GOAL #1   Title  Pt will be I with HEP    Baseline  yes    Time  3    Period  Weeks    Status  Achieved      PT SHORT TERM GOAL #2   Title  Pt will verbalize techniques to decrease pain in hip in order to increase quality of life    Baseline  able to list 4 things to help pain.     Time  3    Period  Weeks    Status  Achieved        PT Long Term Goals - 08/29/18 1726      PT LONG TERM GOAL #1   Title  Pt will demonstrate increased R hip strength >/= 4/5 to decrease pain to </= 2/10 and improve functional mobility    Time  6    Period  Weeks    Status  On-going    Target Date  09/26/18      PT LONG TERM GOAL #2   Title  Pt will increase R hip flexion AROM to >/= 90 degrees and R hip extension AROM to >/= 15 degree to improve functional mobility     Time  6    Period  Weeks    Status  On-going    Target Date  09/26/18      PT LONG TERM GOAL #3   Title  Pt will decrease FOTO score to  </= 45% limited to improve function and quality of life    Time  6    Period  Weeks    Status  On-going    Target Date  09/26/18      PT LONG TERM GOAL #4   Title  Pt will be able to stand and walk >/= 60 minutes to meet pt's goals of returning to work and playing basketball    Time  6    Period  Weeks    Status  On-going    Target Date  09/26/18            Plan - 09/12/18 1447    Clinical Impression Statement  Pt reports improved activity tolerance as he was able to ambulate > 1 hour yesterday for shopping. He was fatigued afterward. He also went on a 30-40 minute walk yesterday. He ambulates up multiple stairs daily using RLE to lead.  Continued with sit-stands and standing hip series for closed chain strengthening. Progressing toward activity goals.     PT Next Visit Plan  no TPDN per pt;  continued hp strengtheing, functional strengthening and activity , consider [place]?     PT Home Exercise Plan  ITB stretch, adductor stretch, sidelying clamshells, bridges, sit to stands, SLR, pelvic tilts,  hip flexor stretch,  warrior pose for anterior hip stretch,  prone quad set,  prone hip extension. standing hip abduction/ extension       Patient will benefit from skilled therapeutic intervention in order to improve the following deficits and impairments:  Abnormal gait, Pain, Decreased mobility, Postural dysfunction, Decreased activity tolerance, Decreased endurance, Decreased strength, Decreased range of motion, Hypomobility, Decreased balance, Difficulty walking, Impaired flexibility  Visit Diagnosis: Pain in right hip  Other abnormalities of gait and  mobility  Muscle weakness (generalized)     Problem List Patient Active Problem List   Diagnosis Date Noted  . Stress 07/16/2018  . History of hip replacement 08/24/2017  . Primary osteoarthritis of right hip 07/11/2017  . Coronary artery disease 04/25/2017  . Seasonal allergies 04/25/2017  . Hyperlipidemia LDL goal <70  03/25/2016  . Cardiomyopathy, ischemic 03/25/2016  . ST elevation (STEMI) myocardial infarction involving left circumflex coronary artery ([place]) 03/16/2016  . Essential hypertension 03/09/2016  . Decreased visual acuity 03/09/2016  . Dental decay 03/09/2016    [name], PTA 09/12/2018, 3:08 PM  [facility] [address] [place], [place], [address] Phone: [phone]   Fax:  [phone]  Name: Kenneth Long MRN: [id number] Date of Birth: 02-18-1966

## 2018-09-12 NOTE — Telephone Encounter (Signed)
Encounter complete. 

## 2018-09-13 ENCOUNTER — Ambulatory Visit (HOSPITAL_COMMUNITY)
Admission: RE | Admit: 2018-09-13 | Discharge: 2018-09-13 | Disposition: A | Discharge status: Home / Self Care | Payer: Medicaid Other | Source: Ambulatory Visit | Attending: Cardiovascular Disease | Admitting: Cardiovascular Disease

## 2018-09-13 DIAGNOSIS — I10 Essential (primary) hypertension: Secondary | ICD-10-CM | POA: Diagnosis not present

## 2018-09-13 DIAGNOSIS — R079 Chest pain, unspecified: Secondary | ICD-10-CM | POA: Diagnosis not present

## 2018-09-13 DIAGNOSIS — E785 Hyperlipidemia, unspecified: Secondary | ICD-10-CM

## 2018-09-13 DIAGNOSIS — I2510 Atherosclerotic heart disease of native coronary artery without angina pectoris: Secondary | ICD-10-CM

## 2018-09-13 LAB — MYOCARDIAL PERFUSION IMAGING
CHL CUP NUCLEAR SRS: 9
LV dias vol: 171 mL (ref 62–150)
LV sys vol: 95 mL
Peak HR: 92 {beats}/min
Rest HR: 64 {beats}/min
SDS: 3
SSS: 12
TID: 1.06

## 2018-09-13 MED ORDER — TECHNETIUM TC 99M TETROFOSMIN IV KIT
8.1000 | PACK | Freq: Once | INTRAVENOUS | Status: AC | PRN
  Administered 2018-09-13: 8.1 via INTRAVENOUS
  Filled 2018-09-13: qty 9

## 2018-09-13 MED ORDER — TECHNETIUM TC 99M TETROFOSMIN IV KIT
30.1000 | PACK | Freq: Once | INTRAVENOUS | Status: AC | PRN
  Administered 2018-09-13: 30.1 via INTRAVENOUS
  Filled 2018-09-13: qty 31

## 2018-09-13 MED ORDER — AMINOPHYLLINE 25 MG/ML IV SOLN
75.0000 mg | Freq: Once | INTRAVENOUS | Status: AC
  Administered 2018-09-13: 75 mg via INTRAVENOUS

## 2018-09-13 MED ORDER — REGADENOSON 0.4 MG/5ML IV SOLN
0.4000 mg | Freq: Once | INTRAVENOUS | Status: AC
  Administered 2018-09-13: 0.4 mg via INTRAVENOUS

## 2018-09-19 ENCOUNTER — Encounter: Payer: Self-pay | Admitting: Physical Therapy

## 2018-09-19 ENCOUNTER — Ambulatory Visit: Payer: Self-pay | Attending: Orthopaedic Surgery | Admitting: Physical Therapy

## 2018-09-19 DIAGNOSIS — M6281 Muscle weakness (generalized): Secondary | ICD-10-CM

## 2018-09-19 DIAGNOSIS — R2689 Other abnormalities of gait and mobility: Secondary | ICD-10-CM

## 2018-09-19 DIAGNOSIS — M25551 Pain in right hip: Secondary | ICD-10-CM

## 2018-09-19 NOTE — Therapy (Signed)
[facility] [address] , [place], [address] Phone: [phone]   Fax:  [phone]  Physical Therapy Treatment  Patient Details  Name: Kenneth Long MRN: [id number] Date of Birth: 01/29/1966 Referring Provider (PT): Xu, Naiping M, MD   Encounter Date: 09/19/2018  PT End of Session - 09/19/18 1419    Visit Number  12    Number of Visits  14    Date for PT Re-Evaluation  09/26/18    PT Start Time  0216    PT Stop Time  0300    PT Time Calculation (min)  44 min       Past Medical History:  Diagnosis Date  . Arthritis   . Asthma   . Coronary artery disease   . Dyspnea    "sometimes" with exertion   . Headache   . Heart murmur   . Hypertension   . Sleep apnea    no CPAP  . ST elevation (STEMI) myocardial infarction involving left circumflex coronary artery (HCC) 03/16/2016   DES CFX  . Umbilical hernia     Past Surgical History:  Procedure Laterality Date  . CARDIAC CATHETERIZATION N/A 03/16/2016   Procedure: Left Heart Cath and Coronary Angiography;  Surgeon: Peter M Jordan, MD;  LAD OK, CFX 100%, OM1 20%, RCA 20%, EF nl  . CARDIAC CATHETERIZATION N/A 03/16/2016   Procedure: Coronary Stent Intervention;  Surgeon: Peter M Jordan, MD; PROMUS PREM MR 3.0X16 mm DES   . TOTAL HIP ARTHROPLASTY Right 08/24/2017   Procedure: RIGHT TOTAL HIP ARTHROPLASTY ANTERIOR APPROACH;  Surgeon: Xu, Naiping M, MD;  Location: [facility] OR;  Service: Orthopedics;  Laterality: Right;  RIGHT TOTAL HIP ARTHROPLASTY ANTERIOR APPROACH  . UMBILICAL HERNIA REPAIR  2003   umbilical hernia repair    There were no vitals filed for this visit.  Subjective Assessment - 09/19/18 1418    Subjective  Sore in arms and thighs/knees from working out with cousin at the gym yesterday. I am trying to get my endurance back.     Currently in Pain?  No/denies         [facility] PT Assessment - 09/19/18 0001      AROM   Right Hip Extension  10    Right  Hip Flexion  85   95 PROM     Strength   Right Hip Flexion  4-/5   full ROM, no pain   Right Hip Extension  3+/5    Right Hip External Rotation   4-/5    Right Hip Internal Rotation  4-/5    Right Hip ABduction  4-/5    Left Hip Flexion  4/5    Left Hip Extension  4-/5    Left Hip External Rotation  4+/5    Left Hip Internal Rotation  4+/5    Left Hip ABduction  4/5                   [facility] Adult PT Treatment/Exercise - 09/19/18 0001      Knee/Hip Exercises: Stretches   Hip Flexor Stretch  2 reps;Right;30 seconds   in standing with added isometric glute contraction   Gastroc Stretch  2 reps;30 seconds   slant board     Knee/Hip Exercises: Aerobic   Nustep  L5 x 6 min LE only      Knee/Hip Exercises: Standing   Heel Raises  1 set;Both;20 reps    Hip Abduction  2 sets;10 reps;Knee straight     yellow band added today   Hip Extension  2 sets;10 reps;Knee straight   yellow band added today     Knee/Hip Exercises: Seated   Sit to Sand  10 reps      Knee/Hip Exercises: Supine   Bridges  20 reps    Straight Leg Raises  10 reps;Right      Knee/Hip Exercises: Sidelying   Clams  --    Other Sidelying Knee/Hip Exercises  hip abduction  2 x 10  each       Knee/Hip Exercises: Prone   Hip Extension  20 reps;Right;Left               PT Short Term Goals - 08/15/18 1513      PT SHORT TERM GOAL #1   Title  Pt will be I with HEP    Baseline  yes    Time  3    Period  Weeks    Status  Achieved      PT SHORT TERM GOAL #2   Title  Pt will verbalize techniques to decrease pain in hip in order to increase quality of life    Baseline  able to list 4 things to help pain.     Time  3    Period  Weeks    Status  Achieved        PT Long Term Goals - 09/19/18 1444      PT LONG TERM GOAL #1   Title  Pt will demonstrate increased R hip strength >/= 4/5 to decrease pain to </= 2/10 and improve functional mobility    Baseline  4-/5 grossly     Time  6     Period  Weeks    Status  Partially Met      PT LONG TERM GOAL #2   Title  Pt will increase R hip flexion AROM to >/= 90 degrees and R hip extension AROM to >/= 15 degree to improve functional mobility     Time  6    Period  Weeks    Status  On-going      PT LONG TERM GOAL #3   Title  Pt will decrease FOTO score to </= 45% limited to improve function and quality of life    Time  6    Period  Weeks    Status  On-going      PT LONG TERM GOAL #4   Title  Pt will be able to stand and walk >/= 60 minutes to meet pt's goals of returning to work and playing basketball    Time  6    Period  Weeks    Status  On-going            Plan - 09/19/18 1421    Clinical Impression Statement  Pt reports walking 2-3 times per week for 10 minutes in neighborhood. He plans to join gym. He reports overall improvement in endurance and tolerance to activity on his feet. His MMT has improved in most planes and is grossly 4-/5 on right. His AROM of right hip for flexion have improved slightly. Added yellow band for standing hip exercise in HEP. One more wewek in [place] then probable DC to HEP.     PT Next Visit Plan  no TPDN per pt;  continued hp strengtheing, functional strengthening and activity , dc next visit? FOTO     PT Home Exercise Plan  ITB stretch, adductor stretch, sidelying clamshells, bridges, sit  to stands, SLR, pelvic tilts,  hip flexor stretch,  warrior pose for anterior hip stretch,  prone quad set,  prone hip extension. standing hip abduction/ extension-added yellow band    Consulted and Agree with Plan of Care  Patient       Patient will benefit from skilled therapeutic intervention in order to improve the following deficits and impairments:  Abnormal gait, Pain, Decreased mobility, Postural dysfunction, Decreased activity tolerance, Decreased endurance, Decreased strength, Decreased range of motion, Hypomobility, Decreased balance, Difficulty walking, Impaired flexibility  Visit  Diagnosis: Other abnormalities of gait and mobility  Pain in right hip  Muscle weakness (generalized)     Problem List Patient Active Problem List   Diagnosis Date Noted  . Stress 07/16/2018  . History of hip replacement 08/24/2017  . Primary osteoarthritis of right hip 07/11/2017  . Coronary artery disease 04/25/2017  . Seasonal allergies 04/25/2017  . Hyperlipidemia LDL goal <70 03/25/2016  . Cardiomyopathy, ischemic 03/25/2016  . ST elevation (STEMI) myocardial infarction involving left circumflex coronary artery (HCC) 03/16/2016  . Essential hypertension 03/09/2016  . Decreased visual acuity 03/09/2016  . Dental decay 03/09/2016    Donoho, Jessica McGee, PTA 09/19/2018, 2:58 PM  [facility] [address] [place], [place], [address] Phone: [phone]   Fax:  [phone]  Name: Kenneth Long MRN: [id number] Date of Birth: 01/15/1966   

## 2018-09-26 ENCOUNTER — Encounter: Payer: Self-pay | Admitting: Physical Therapy

## 2018-09-26 ENCOUNTER — Ambulatory Visit: Payer: Self-pay | Admitting: Physical Therapy

## 2018-09-26 DIAGNOSIS — M25551 Pain in right hip: Secondary | ICD-10-CM

## 2018-09-26 DIAGNOSIS — R2689 Other abnormalities of gait and mobility: Secondary | ICD-10-CM

## 2018-09-26 DIAGNOSIS — M6281 Muscle weakness (generalized): Secondary | ICD-10-CM

## 2018-09-26 NOTE — Therapy (Signed)
[place], [place], [address] Phone: [phone]   Fax:  [phone]  Physical Therapy Treatment / Discharge Summary  Patient Details  Name: Kenneth Long MRN: [id number] Date of Birth: 25-Sep-1966 Referring Provider (PT): Leandrew Koyanagi, MD   Encounter Date: 09/26/2018  PT End of Session - 09/26/18 1418    Visit Number  13    Number of Visits  14    Date for PT Re-Evaluation  09/26/18    PT Start Time  7322    PT Stop Time  1456    PT Time Calculation (min)  39 min    Activity Tolerance  Patient tolerated treatment well    Behavior During Therapy  [facility] for tasks assessed/performed       Past Medical History:  Diagnosis Date  . Arthritis   . Asthma   . Coronary artery disease   . Dyspnea    "sometimes" with exertion   . Headache   . Heart murmur   . Hypertension   . Sleep apnea    no CPAP  . ST elevation (STEMI) myocardial infarction involving left circumflex coronary artery ([place]) 03/16/2016   DES CFX  . Umbilical hernia     Past Surgical History:  Procedure Laterality Date  . CARDIAC CATHETERIZATION N/A 03/16/2016   Procedure: Left Heart Cath and Coronary Angiography;  Surgeon: Peter M Martinique, MD;  LAD OK, CFX 100%, OM1 20%, RCA 20%, EF nl  . CARDIAC CATHETERIZATION N/A 03/16/2016   Procedure: Coronary Stent Intervention;  Surgeon: Peter M Martinique, MD; PROMUS PREM Kenneth 3.0X16 mm DES   . TOTAL HIP ARTHROPLASTY Right 08/24/2017   Procedure: RIGHT TOTAL HIP ARTHROPLASTY ANTERIOR APPROACH;  Surgeon: Leandrew Koyanagi, MD;  Location: [facility];  Service: Orthopedics;  Laterality: Right;  RIGHT TOTAL HIP ARTHROPLASTY ANTERIOR APPROACH  . UMBILICAL HERNIA REPAIR  0254   umbilical hernia repair    There were no vitals filed for this visit.  Subjective Assessment - 09/26/18 1418    Subjective  "My hip is doing pretty good, my feet are swollen today but I think its my diet"     Patient Stated Goals  get  back to playing basketball and working    Currently in Pain?  No/denies    Pain Orientation  Right    Aggravating Factors   N/A         [facility] PT Assessment - 09/26/18 0001      Observation/Other Assessments   Focus on Therapeutic Outcomes (FOTO)   60% limited      AROM   Right Hip Extension  10    Right Hip Flexion  92      Strength   Right Hip Flexion  4/5    Right Hip Extension  4-/5    Right Hip External Rotation   4/5    Right Hip Internal Rotation  4+/5    Right Hip ABduction  4-/5   in available ROM                  [facility] Adult PT Treatment/Exercise - 09/26/18 0001      Knee/Hip Exercises: Stretches   Other Knee/Hip Stretches  warrior pose 2 x 30 sec hold      Knee/Hip Exercises: Aerobic   Nustep  L6 x 6 min LE only      Knee/Hip Exercises: Machines for Strengthening   Total Gym Leg Press  2 x 12 bil LE  40#      Knee/Hip Exercises: Standing   Heel Raises  1 set;20 reps    Hip Abduction  2 sets;10 reps;Knee straight    Hip Extension  2 sets;10 reps;Knee straight      Knee/Hip Exercises: Seated   Sit to Sand  1 set;5 reps             PT Education - 09/26/18 1452    Education Details  reviewed previously provided HEP and importance of continued strengthening to increase reps/ sets to promote endurance. Trial getting into a gym as he feels comfortable and perhaps trial working with a Engineer, civil (consulting)) Educated  Patient    Methods  Explanation;Verbal cues    Comprehension  Verbalized understanding;Verbal cues required       PT Short Term Goals - 08/15/18 1513      PT SHORT TERM GOAL #1   Title  Pt will be I with HEP    Baseline  yes    Time  3    Period  Weeks    Status  Achieved      PT SHORT TERM GOAL #2   Title  Pt will verbalize techniques to decrease pain in hip in order to increase quality of life    Baseline  able to list 4 things to help pain.     Time  3    Period  Weeks    Status  Achieved        PT  Long Term Goals - 09/26/18 1454      PT LONG TERM GOAL #1   Title  Pt will demonstrate increased R hip strength >/= 4/5 to decrease pain to </= 2/10 and improve functional mobility    Time  6    Period  Weeks    Status  Partially Met      PT LONG TERM GOAL #2   Title  Pt will increase R hip flexion AROM to >/= 90 degrees and R hip extension AROM to >/= 15 degree to improve functional mobility     Baseline  10 degrees of extension    Time  6    Period  Weeks    Status  Partially Met      PT LONG TERM GOAL #3   Title  Pt will decrease FOTO score to </= 45% limited to improve function and quality of life    Baseline  60% limited    Time  6    Period  Weeks    Status  Not Met      PT LONG TERM GOAL #4   Title  Pt will be able to stand and walk >/= 60 minutes to meet pt's goals of returning to work and playing basketball    Baseline  able to walk 40 min    Time  6    Period  Weeks    Status  Partially Met            Plan - 09/26/18 1455    Clinical Impression Statement  Kenneth Long demonstrates significant improvement in hip ROM and strength since starting physical therapy. he continues to demosntrate limited hip extension to 10 degrees. he was able to do all exercises reporting no pain just fatigue. he met or partially met all goals except for LTG #3. He is able to maintain and progress his current level of function independelty and will be discharged from PT today.  PT Next Visit Plan  DC    PT Home Exercise Plan  ITB stretch, adductor stretch, sidelying clamshells, bridges, sit to stands, SLR, pelvic tilts,  hip flexor stretch,  warrior pose for anterior hip stretch,  prone quad set,  prone hip extension. standing hip abduction/ extension-added yellow band    Consulted and Agree with Plan of Care  Patient       Patient will benefit from skilled therapeutic intervention in order to improve the following deficits and impairments:     Visit Diagnosis: Other  abnormalities of gait and mobility  Pain in right hip  Muscle weakness (generalized)     Problem List Patient Active Problem List   Diagnosis Date Noted  . Stress 07/16/2018  . History of hip replacement 08/24/2017  . Primary osteoarthritis of right hip 07/11/2017  . Coronary artery disease 04/25/2017  . Seasonal allergies 04/25/2017  . Hyperlipidemia LDL goal <70 03/25/2016  . Cardiomyopathy, ischemic 03/25/2016  . ST elevation (STEMI) myocardial infarction involving left circumflex coronary artery ([place]) 03/16/2016  . Essential hypertension 03/09/2016  . Decreased visual acuity 03/09/2016  . Dental decay 03/09/2016    [place] 09/26/2018, 2:58 PM  [facility] [address] [place], [place], [address] Phone: [phone]   Fax:  [phone]  Name: Kenneth Long MRN: [id number] Date of Birth: 03-18-1966         PHYSICAL THERAPY DISCHARGE SUMMARY  Visits from Start of Care: 13  Current functional level related to goals / functional outcomes: See goals, FOTO 40% limited   Remaining deficits: Intermittent pain in the hip, limited endurance with prolonged walking / standing.    Education / Equipment: HEP, theraband, posture  Plan: Patient agrees to discharge.  Patient goals were partially met. Patient is being discharged due to being pleased with the current functional level.  ?????         Daralyn Bert PT, DPT, LAT, ATC  09/26/18  3:00 PM

## 2018-10-09 ENCOUNTER — Other Ambulatory Visit: Payer: Self-pay

## 2018-10-09 DIAGNOSIS — E785 Hyperlipidemia, unspecified: Secondary | ICD-10-CM

## 2018-10-09 IMAGING — DX DG PORTABLE PELVIS
2 series · 2 of 2 positions shown · non-contrast
Comparison: Intra op radiographs from the same day.

CLINICAL DATA: Right total hip replacement.

EXAM:
PORTABLE PELVIS 1-2 VIEWS

[pelvis ap (1 of 2)]
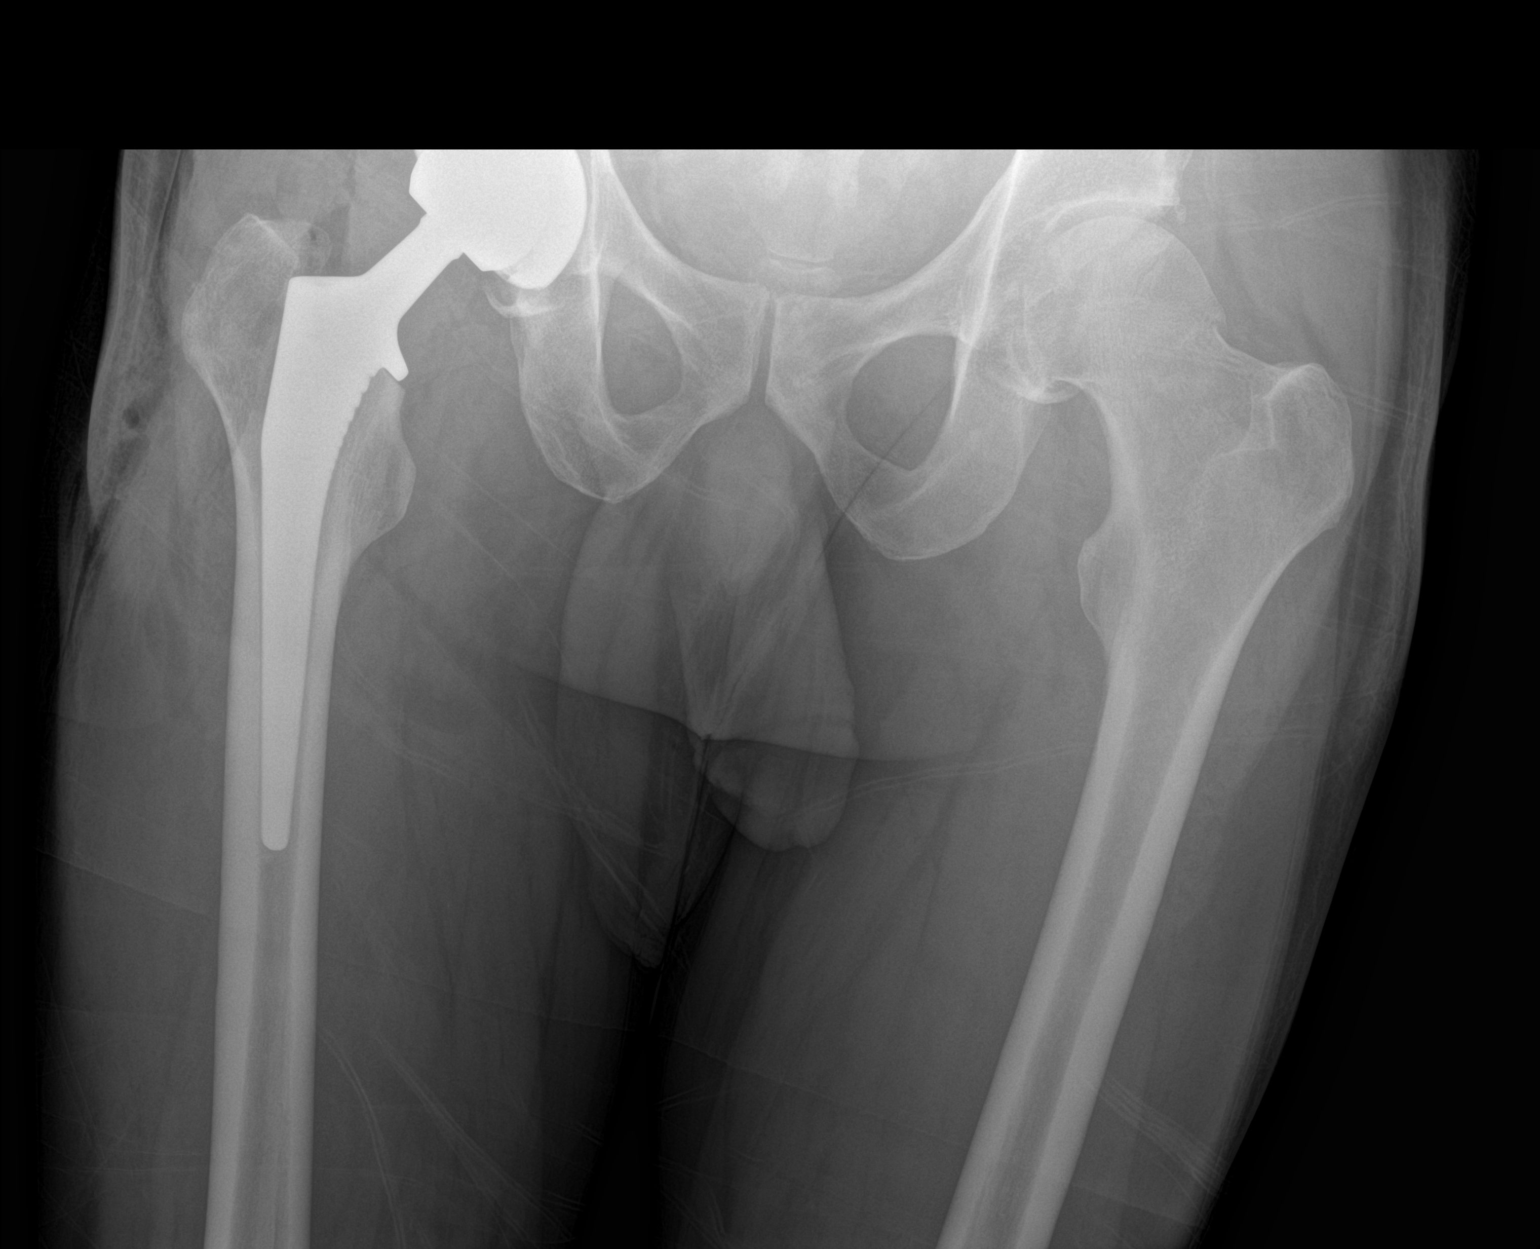

[pelvis ap (2 of 2)]
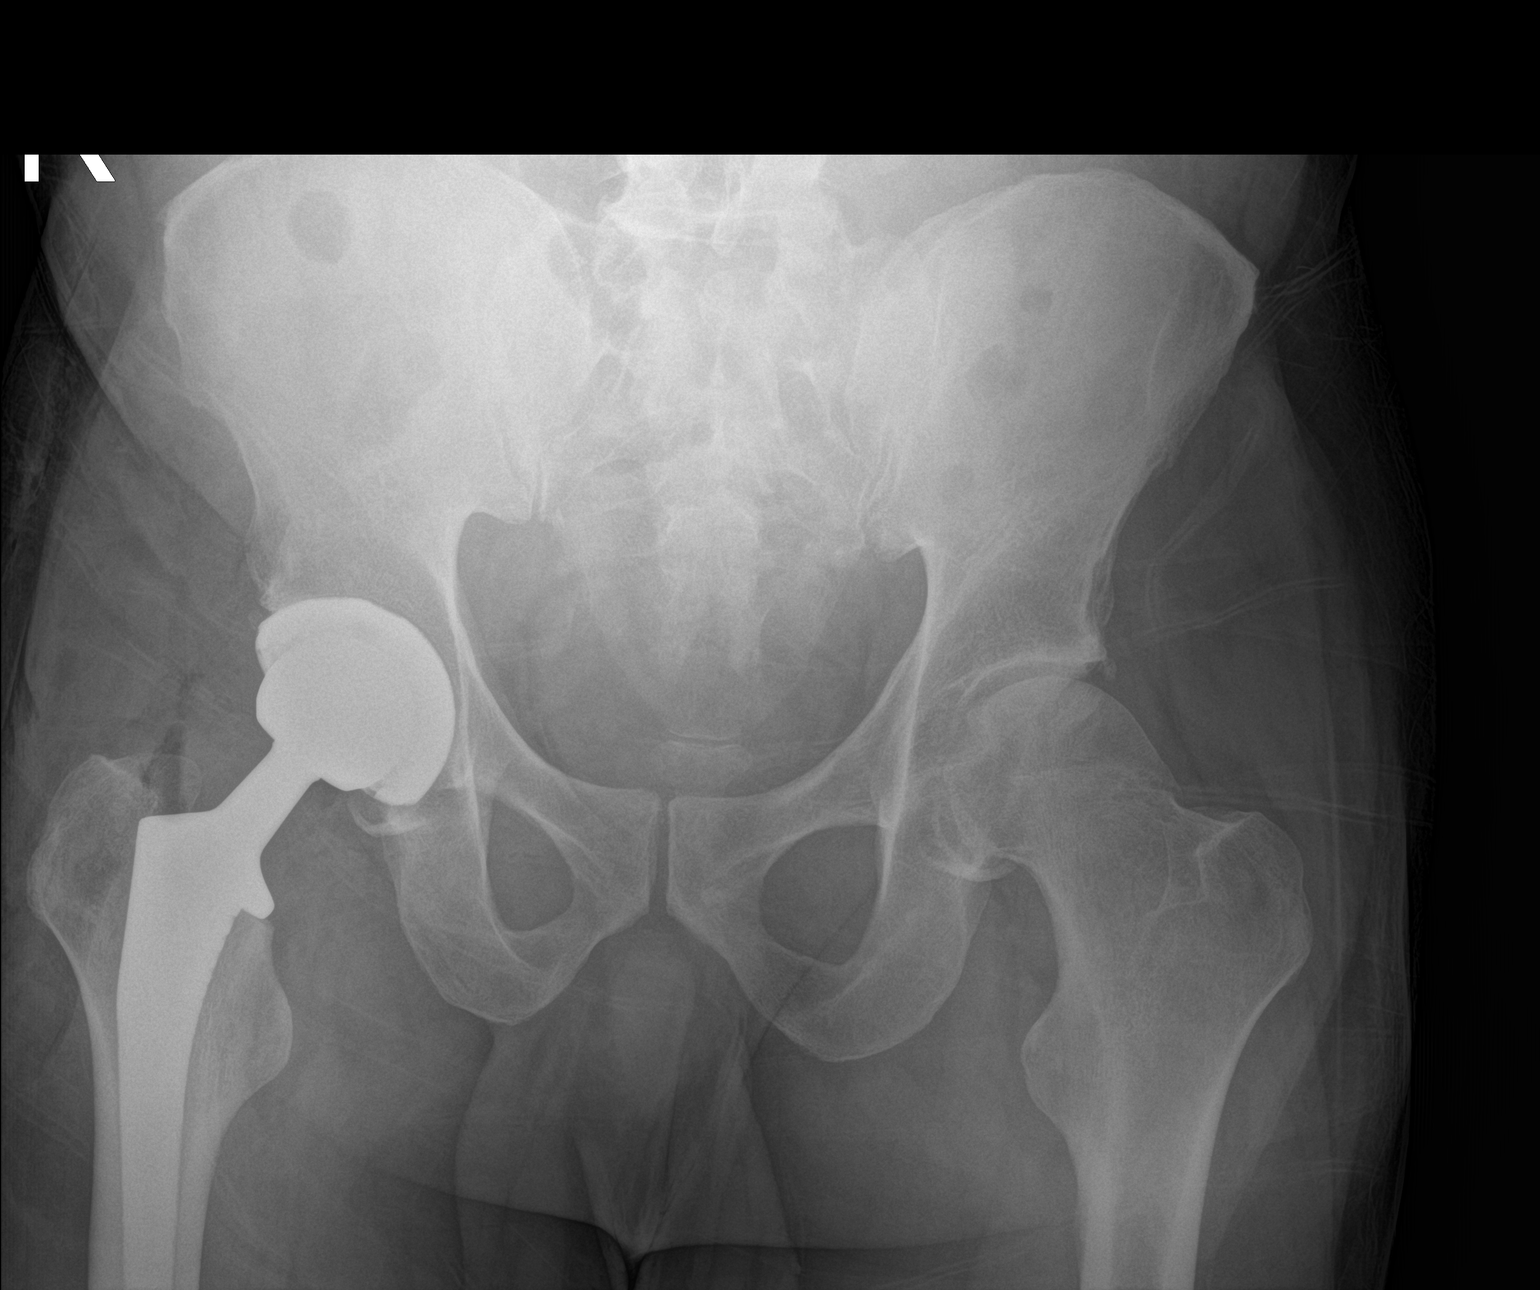

[2 of 2 positions shown; findings below may reference images not displayed]

FINDINGS: Right total hip arthroplasty is present. The femoral and acetabular
components are well seated. No acute fractures present. Gas is
present joint.
IMPRESSION: Right total hip arthroplasty without radiographic evidence for
complication.

## 2018-10-10 LAB — LIPID PANEL W/O CHOL/HDL RATIO
Cholesterol, Total: 97 mg/dL — ABNORMAL LOW (ref 100–199)
HDL: 27 mg/dL — ABNORMAL LOW (ref >39)
LDL Calculated: 21 mg/dL (ref 0–99)
Triglycerides: 244 mg/dL — ABNORMAL HIGH (ref 0–149)
VLDL Cholesterol Cal: 49 mg/dL — ABNORMAL HIGH (ref 5–40)

## 2018-10-13 ENCOUNTER — Encounter: Payer: Self-pay | Admitting: Internal Medicine

## 2018-10-13 ENCOUNTER — Ambulatory Visit: Payer: Self-pay | Admitting: Internal Medicine

## 2018-10-13 VITALS — BP 122/88 | HR 74 | Resp 12 | Ht 69.0 in | Wt 170.0 lb

## 2018-10-13 DIAGNOSIS — E785 Hyperlipidemia, unspecified: Secondary | ICD-10-CM

## 2018-10-13 DIAGNOSIS — M79671 Pain in right foot: Secondary | ICD-10-CM

## 2018-10-13 NOTE — Progress Notes (Signed)
   Subjective:    Patient ID: Kenneth Long, male    DOB: [birth year], 52 y.o.   MRN: [id number]  HPI   1. Hyperlipidemia:  Has changed his diet.  Not eating late.  Staying away from fried foods. Eating a lot of fish.  Staying away from pork and red meat. Also, went back to [employer] and picked up more fish oil.  Taking 2000 mg twice daily of fish oil. Triglycerides much improved, though not quite at goal.  LDL at goal.  HDL limited by inability to exercise with hip and now foot pain.  Lipid Panel     Component Value Date/Time   CHOL 97 (L) 10/09/2018 1038   TRIG 244 (H) 10/09/2018 1038   HDL 27 (L) 10/09/2018 1038   CHOLHDL 6.5 03/17/2016 0407   VLDL 76 (H) 03/17/2016 0407   LDLCALC 21 10/09/2018 1038   2.  Right foot pain:  Has had problems with swelling intermittently for some time.  Last PT visit, he was doing heel lifts where he goes up on ball of foot and tightens calf muscles and the foot popped.  The next day, the foot was huge with swelling.  Foot hurts at proximal dorsal area--this is where it popped.  Also some discomfort at the MTP joint area.    Current Meds  Medication Sig  . albuterol (PROVENTIL HFA;VENTOLIN HFA) 108 (90 Base) MCG/ACT inhaler Inhale 2 puffs into the lungs every 6 (six) hours as needed for wheezing or shortness of breath.  [name] amLODipine (NORVASC) 10 MG tablet Take 1 tablet (10 mg total) by mouth daily.  [name] aspirin EC 81 MG tablet Take 1 tablet (81 mg total) by mouth daily.  [name] atorvastatin (LIPITOR) 80 MG tablet Take 1 tablet (80 mg total) by mouth daily.  . carvedilol (COREG) 25 MG tablet Take 1 tablet (25 mg total) by mouth 2 (two) times daily.  . cetirizine (ZYRTEC) 10 MG tablet Take 1 tablet (10 mg total) by mouth daily.  [name] lisinopril-hydrochlorothiazide (PRINZIDE,ZESTORETIC) 20-25 MG tablet 1 tab by mouth daily  . mometasone (NASONEX) 50 MCG/ACT nasal spray 2 sprays each nostril daily  . nitroGLYCERIN (NITROSTAT) 0.4 MG SL tablet Place  1 tablet (0.4 mg total) under the tongue every 5 (five) minutes as needed for chest pain.  . Omega-3 Fatty Acids (FISH OIL) 1000 MG CAPS 1000 mg twice daily  . tamsulosin (FLOMAX) 0.4 MG CAPS capsule 1 cap by mouth at bedtime.     Review of Systems     Objective:   Physical Exam  Right foot:  Swelling posterior to lateral malleolus and across proximal dorsal foot.  Some dark discoloration on dorsal foot as go out to 1st MTP joint.  Very tender over proximal dorsal foot and along discolored area to MTP.  1st MTP joint with tenderness as well.  Perhaps some mild erythema.  No increased warmth.  Limited movement of ankle and foot due to pain      Assessment & Plan:  1.  Hyperlipidemia with triglyceridemia:  Doing much better, though still not quite at goal with triglycerides and HDL. LDL well below goal.   CPM with Atorvastatin and Fish Oil as well as dietary changes  2.  Right foot pain and swelling: Uric acid level added to previously drawn blood Send for xray.  As had some chronic pain before the "popping" sensation in foot, perhaps a stress fracture vs possible tendon or ligament injury.

## 2018-10-13 NOTE — Patient Instructions (Signed)
Keep foot elevated and apply ice as needed to control pain

## 2018-10-16 LAB — SPECIMEN STATUS REPORT

## 2018-10-16 LAB — URIC ACID: Uric Acid: 8.3 mg/dL (ref 3.7–8.6)

## 2018-10-27 ENCOUNTER — Other Ambulatory Visit: Payer: Self-pay

## 2018-10-27 MED ORDER — ICOSAPENT ETHYL 1 G PO CAPS: ORAL_CAPSULE | ORAL | 6 refills | Status: DC

## 2019-01-19 ENCOUNTER — Other Ambulatory Visit: Payer: Self-pay

## 2019-02-06 ENCOUNTER — Other Ambulatory Visit: Payer: Self-pay

## 2019-02-06 ENCOUNTER — Telehealth: Payer: Self-pay | Admitting: Internal Medicine

## 2019-02-06 DIAGNOSIS — I10 Essential (primary) hypertension: Secondary | ICD-10-CM

## 2019-02-06 MED ORDER — ATORVASTATIN CALCIUM 80 MG PO TABS: 80.0000 mg | ORAL_TABLET | Freq: Every day | ORAL | 7 refills | Status: DC

## 2019-02-06 MED ORDER — NITROGLYCERIN 0.4 MG SL SUBL: 0.4000 mg | SUBLINGUAL_TABLET | SUBLINGUAL | 12 refills | Status: DC | PRN

## 2019-02-06 MED ORDER — TAMSULOSIN HCL 0.4 MG PO CAPS: ORAL_CAPSULE | ORAL | 11 refills | Status: DC

## 2019-02-06 MED ORDER — LISINOPRIL-HYDROCHLOROTHIAZIDE 20-25 MG PO TABS: ORAL_TABLET | ORAL | 11 refills | Status: DC

## 2019-02-06 MED ORDER — CETIRIZINE HCL 10 MG PO TABS: 10.0000 mg | ORAL_TABLET | Freq: Every day | ORAL | 11 refills | Status: DC

## 2019-02-06 MED ORDER — MOMETASONE FUROATE 50 MCG/ACT NA SUSP: NASAL | 12 refills | Status: DC

## 2019-02-06 MED ORDER — AMLODIPINE BESYLATE 10 MG PO TABS: 10.0000 mg | ORAL_TABLET | Freq: Every day | ORAL | 11 refills | Status: DC

## 2019-02-06 NOTE — Telephone Encounter (Signed)
Patient needs Rx's transferred to [facility]  Patient now has Medicaid and cannot pick up Rx from Health Department.  [phone] Patient contact number.

## 2019-02-06 NOTE — Telephone Encounter (Signed)
Rx's sent to pharmacy. Please notify patient.

## 2019-02-13 ENCOUNTER — Other Ambulatory Visit (INDEPENDENT_AMBULATORY_CARE_PROVIDER_SITE_OTHER): Payer: Medicaid Other

## 2019-02-13 DIAGNOSIS — E785 Hyperlipidemia, unspecified: Secondary | ICD-10-CM

## 2019-02-14 LAB — LIPID PANEL W/O CHOL/HDL RATIO
Cholesterol, Total: 110 mg/dL (ref 100–199)
HDL: 35 mg/dL — AB (ref >39)
Triglycerides: 548 mg/dL — ABNORMAL HIGH (ref 0–149)

## 2019-02-16 ENCOUNTER — Encounter: Payer: Self-pay | Admitting: Internal Medicine

## 2019-02-16 ENCOUNTER — Ambulatory Visit: Payer: Medicaid Other | Admitting: Internal Medicine

## 2019-02-16 VITALS — BP 138/80 | HR 96 | Resp 12 | Ht 69.0 in | Wt 170.0 lb

## 2019-02-16 DIAGNOSIS — E782 Mixed hyperlipidemia: Secondary | ICD-10-CM

## 2019-02-16 DIAGNOSIS — I2510 Atherosclerotic heart disease of native coronary artery without angina pectoris: Secondary | ICD-10-CM

## 2019-02-16 DIAGNOSIS — Z1211 Encounter for screening for malignant neoplasm of colon: Secondary | ICD-10-CM | POA: Diagnosis not present

## 2019-02-16 MED ORDER — CARVEDILOL 25 MG PO TABS: 25.0000 mg | ORAL_TABLET | Freq: Two times a day (BID) | ORAL | 3 refills | Status: DC

## 2019-02-16 NOTE — Progress Notes (Signed)
    Subjective:    Patient ID: Kenneth Long, male   DOB: 07-01-1966, 53 y.o.   MRN: [id number]   HPI   1.  Hyperlipidemia with high triglycerides:  He was out of fish oil (2000 mg twice daily) for month of January and was out of Atorvastatin for about the same length of time.  FLP with trigs higher.   Dr. Martinique, cardiology, ordered Icosapent Ethyl (fish oil), but not covered by Medicaid and quite expensive.  Patient states Dr. Doug Sou office notified by pharmacy. He did not call Dr. Doug Sou office to confirm, however.   He is walking and trying to eat healthy.  Hip still bothers him a bit.    2.  HM:  Needs screening colonoscopy.  3.  CAD:  No chest pain, PND, Orthopnea.  Current Meds  Medication Sig  . amLODipine (NORVASC) 10 MG tablet Take 1 tablet (10 mg total) by mouth daily.  [name] aspirin EC 81 MG tablet Take 1 tablet (81 mg total) by mouth daily.  [name] atorvastatin (LIPITOR) 80 MG tablet Take 1 tablet (80 mg total) by mouth daily.  . cetirizine (ZYRTEC) 10 MG tablet Take 1 tablet (10 mg total) by mouth daily.  [name] lisinopril-hydrochlorothiazide (PRINZIDE,ZESTORETIC) 20-25 MG tablet 1 tab by mouth daily  . mometasone (NASONEX) 50 MCG/ACT nasal spray 2 sprays each nostril daily  . nitroGLYCERIN (NITROSTAT) 0.4 MG SL tablet Place 1 tablet (0.4 mg total) under the tongue every 5 (five) minutes as needed for chest pain.  . tamsulosin (FLOMAX) 0.4 MG CAPS capsule 1 cap by mouth at bedtime.   Allergies  Allergen Reactions  . Peanut-Containing Drug Products Anaphylaxis and Swelling  . Shellfish Allergy Anaphylaxis and Swelling     Review of Systems    Objective:   BP 138/80 (BP Location: Left Arm, Patient Position: Sitting, Cuff Size: Normal)   Pulse 96   Resp 12   Ht 5\' 9"  (1.753 m)   Wt 170 lb (77.1 kg)   BMI 25.10 kg/m   Physical Exam  NAD Lungs:  CTA CV:  RRR without murmur or rub.  Radial and DP pulses normal and equal. LE:  No edema.   Assessment & Plan    1.  Hyperlipidemia with elevated LDL and Triglycerides:  Urged him to get back on medication without fail, both fish oil and Atorvastatin. Repeat FLP/hepatic profile.  2.  CAD:  Stable  3.  HM:  Schedule colonoscopy.

## 2019-04-11 ENCOUNTER — Telehealth: Payer: Self-pay

## 2019-04-11 NOTE — Telephone Encounter (Signed)
Spoke to patient about rescheduling visit due to covid. Patient states he wants to wait to have an office visit with the Doctor. I informed patient it will be after August before we schedule office visit. He is ok with that.

## 2019-04-17 ENCOUNTER — Other Ambulatory Visit: Payer: Self-pay

## 2019-04-17 ENCOUNTER — Other Ambulatory Visit (INDEPENDENT_AMBULATORY_CARE_PROVIDER_SITE_OTHER): Payer: Medicaid Other

## 2019-04-17 DIAGNOSIS — E785 Hyperlipidemia, unspecified: Secondary | ICD-10-CM | POA: Diagnosis not present

## 2019-04-17 DIAGNOSIS — Z79899 Other long term (current) drug therapy: Secondary | ICD-10-CM

## 2019-04-18 LAB — HEPATIC FUNCTION PANEL
ALT: 47 IU/L — ABNORMAL HIGH (ref 0–44)
AST: 41 IU/L — ABNORMAL HIGH (ref 0–40)
Albumin: 5.2 g/dL — ABNORMAL HIGH (ref 3.8–4.9)
Alkaline Phosphatase: 90 IU/L (ref 39–117)
Bilirubin Total: 0.4 mg/dL (ref 0.0–1.2)
Bilirubin, Direct: 0.18 mg/dL (ref 0.00–0.40)
Total Protein: 7.9 g/dL (ref 6.0–8.5)

## 2019-04-18 LAB — LIPID PANEL W/O CHOL/HDL RATIO
Cholesterol, Total: 134 mg/dL (ref 100–199)
HDL: 42 mg/dL (ref >39)
Triglycerides: 644 mg/dL (ref 0–149)

## 2019-04-20 ENCOUNTER — Encounter: Payer: Self-pay | Admitting: Internal Medicine

## 2019-04-20 ENCOUNTER — Telehealth (INDEPENDENT_AMBULATORY_CARE_PROVIDER_SITE_OTHER): Payer: Medicaid Other | Admitting: Internal Medicine

## 2019-04-20 ENCOUNTER — Other Ambulatory Visit: Payer: Self-pay

## 2019-04-20 DIAGNOSIS — E782 Mixed hyperlipidemia: Secondary | ICD-10-CM | POA: Diagnosis not present

## 2019-04-20 NOTE — Progress Notes (Signed)
    Subjective:    Patient ID: Kenneth Long, male   DOB: 1966/04/04, 53 y.o.   MRN: [id number]   HPI   Hyperlipidemia with high triglycerides:  He was not taking both Atorvastatin 80 mg and Fish oil 2 g twice daily for a month prior to visit on 02/16/2019.   He states he has been taking Atorvastatin 80 mg daily and Fish oil, the latter maybe 800 mg 2 tabs twice daily since last visit. Not clear when he stopped getting Fish Oil from [employer], which has more regimented levels of active ingredients in supplements.  Discussed mild elevation of transaminases as well.  Current Meds  Medication Sig  . amLODipine (NORVASC) 10 MG tablet Take 1 tablet (10 mg total) by mouth daily.  [name] aspirin EC 81 MG tablet Take 1 tablet (81 mg total) by mouth daily.  [name] atorvastatin (LIPITOR) 80 MG tablet Take 1 tablet (80 mg total) by mouth daily.  . carvedilol (COREG) 25 MG tablet Take 1 tablet (25 mg total) by mouth 2 (two) times daily.  . cetirizine (ZYRTEC) 10 MG tablet Take 1 tablet (10 mg total) by mouth daily.  [name] lisinopril-hydrochlorothiazide (PRINZIDE,ZESTORETIC) 20-25 MG tablet 1 tab by mouth daily  . mometasone (NASONEX) 50 MCG/ACT nasal spray 2 sprays each nostril daily  . Omega-3 Fatty Acids (FISH OIL) 1000 MG CAPS Take 800 mg by mouth. 2 caps twice daily  . tamsulosin (FLOMAX) 0.4 MG CAPS capsule 1 cap by mouth at bedtime.   Allergies  Allergen Reactions  . Peanut-Containing Drug Products Anaphylaxis and Swelling  . Shellfish Allergy Anaphylaxis and Swelling     Review of Systems    Objective:   There were no vitals taken for this visit.  Physical Exam His phone did not have the capacity to connect with video, so phone visit only today.  Assessment & Plan   1.  Hyperlipidemia with high triglycerides, low HDL and uncertain LDL. Suspect he is not taking meds appropriately Discussed again to call if he is having a problem obtaining what we discuss. Will check with  pharmacy regarding coverage of Vascepa or Lovaza.  If neither covered, will check into prior auth  2.  Elevated transaminases:  Likely due to fatty liver, but did just start back on Atorvastatin.  Recheck with repeat FLP in 2 months if can get the fish oil situation taken care of.

## 2019-04-24 ENCOUNTER — Ambulatory Visit: Payer: Self-pay | Admitting: Cardiology

## 2019-04-27 ENCOUNTER — Other Ambulatory Visit: Payer: Self-pay

## 2019-04-27 MED ORDER — ICOSAPENT ETHYL 1 G PO CAPS: ORAL_CAPSULE | ORAL | 11 refills | Status: DC

## 2019-05-25 ENCOUNTER — Other Ambulatory Visit: Payer: Self-pay

## 2019-05-25 ENCOUNTER — Ambulatory Visit (AMBULATORY_SURGERY_CENTER): Payer: Medicaid Other | Admitting: *Deleted

## 2019-05-25 VITALS — Ht 69.0 in | Wt 170.0 lb

## 2019-05-25 DIAGNOSIS — Z1211 Encounter for screening for malignant neoplasm of colon: Secondary | ICD-10-CM

## 2019-05-25 MED ORDER — PEG 3350-KCL-NA BICARB-NACL 420 G PO SOLR: 4000.0000 mL | Freq: Once | ORAL | 0 refills | Status: AC

## 2019-05-25 NOTE — Progress Notes (Signed)

## 2019-06-06 ENCOUNTER — Encounter: Payer: Self-pay | Admitting: Gastroenterology

## 2019-06-21 ENCOUNTER — Other Ambulatory Visit: Payer: Self-pay

## 2019-06-21 ENCOUNTER — Other Ambulatory Visit (INDEPENDENT_AMBULATORY_CARE_PROVIDER_SITE_OTHER): Payer: Medicaid Other

## 2019-06-21 DIAGNOSIS — Z79899 Other long term (current) drug therapy: Secondary | ICD-10-CM | POA: Diagnosis not present

## 2019-06-21 DIAGNOSIS — E785 Hyperlipidemia, unspecified: Secondary | ICD-10-CM | POA: Diagnosis not present

## 2019-06-21 MED ORDER — VASCEPA 1 G PO CAPS: ORAL_CAPSULE | ORAL | 11 refills | Status: DC

## 2019-06-22 LAB — LIPID PANEL W/O CHOL/HDL RATIO
Cholesterol, Total: 99 mg/dL — ABNORMAL LOW (ref 100–199)
HDL: 37 mg/dL — ABNORMAL LOW (ref >39)
Triglycerides: 358 mg/dL — ABNORMAL HIGH (ref 0–149)
VLDL Cholesterol Cal: 72 mg/dL — ABNORMAL HIGH (ref 5–40)

## 2019-06-22 LAB — HEPATIC FUNCTION PANEL
ALT: 36 IU/L (ref 0–44)
AST: 30 IU/L (ref 0–40)
Albumin: 4.8 g/dL (ref 3.8–4.9)
Alkaline Phosphatase: 75 IU/L (ref 39–117)
Bilirubin Total: 0.4 mg/dL (ref 0.0–1.2)
Bilirubin, Direct: 0.15 mg/dL (ref 0.00–0.40)
Total Protein: 7.5 g/dL (ref 6.0–8.5)

## 2019-06-27 ENCOUNTER — Telehealth: Payer: Self-pay | Admitting: Internal Medicine

## 2019-06-27 NOTE — Telephone Encounter (Signed)
Agree Vascepa is the best choice and triglycerides are better. I would continue Vascepa and high dose statin. I would not add a fenofibrate at this point  Thanks Collier Salina

## 2019-06-27 NOTE — Telephone Encounter (Signed)
Spoke to pt. Today and states he was able to get Atorvastatin and Vacepa and is taking daily as prescribed and has not missed any doses Atorvastatin one tablet a day Vacepa 2 caps twice a day

## 2019-06-29 NOTE — Telephone Encounter (Signed)
To continue with current medication for cholesterol--no change

## 2019-07-02 NOTE — Telephone Encounter (Signed)
Spoke with patient. Verbalized understanding on medication

## 2019-07-13 ENCOUNTER — Telehealth: Payer: Self-pay | Admitting: Gastroenterology

## 2019-07-13 NOTE — Telephone Encounter (Signed)

## 2019-07-13 NOTE — Telephone Encounter (Signed)
Pt reported that he has shortness of breath and coughing.

## 2019-07-16 ENCOUNTER — Telehealth: Payer: Self-pay | Admitting: *Deleted

## 2019-07-16 ENCOUNTER — Telehealth: Payer: Self-pay | Admitting: Gastroenterology

## 2019-07-16 ENCOUNTER — Encounter: Payer: Self-pay | Admitting: Gastroenterology

## 2019-07-16 NOTE — Telephone Encounter (Signed)
Staff messgae was sent to PV that pt wanted his new instructions gone over- pt requests a PV to discuss instructions in person- PV scheduled for 8-6 Thursday at 11 am- pt instructed to checkin 3rd floor at 1045 am for 11 am PV

## 2019-07-16 NOTE — Telephone Encounter (Signed)
Ok, thanks.

## 2019-07-16 NOTE — Telephone Encounter (Signed)
PT had called Friday after missing his COVID screen to say that he was having SOB and Coughing and wanted to speak with a RN. This information was missed by the nursing staff so the patient did not take his prep and the procedure for today was canceled. I spoke with him today and he is feeling better with no fever or SOB/ cough. I rescheduled him for August 19 at 11:00. He wished to have a pre visit RN To call him back to review the prep instructions. Messages sent to Lelan Pons and [employer].

## 2019-07-26 ENCOUNTER — Ambulatory Visit (AMBULATORY_SURGERY_CENTER): Payer: Self-pay | Admitting: *Deleted

## 2019-07-26 ENCOUNTER — Other Ambulatory Visit: Payer: Self-pay

## 2019-07-26 VITALS — Ht 69.0 in | Wt 169.9 lb

## 2019-07-26 DIAGNOSIS — Z1211 Encounter for screening for malignant neoplasm of colon: Secondary | ICD-10-CM

## 2019-07-26 NOTE — Progress Notes (Signed)
No egg or soy allergy known to patient  No issues with past sedation with any surgeries  or procedures, no intubation problems  No diet pills per patient No home 02 use per patient  No blood thinners per patient  Pt denies issues with constipation  No A fib or A flutter  EMMI video sent to pt's e mail  In person PV today  Pt has a Golytely prep at home from a 05-2019 PV

## 2019-08-07 ENCOUNTER — Telehealth: Payer: Self-pay

## 2019-08-07 DIAGNOSIS — D126 Benign neoplasm of colon, unspecified: Secondary | ICD-10-CM

## 2019-08-07 HISTORY — DX: Benign neoplasm of colon, unspecified: D12.6

## 2019-08-07 NOTE — Telephone Encounter (Signed)
Covid-19 screening questions   Do you now or have you had a fever in the last 14 days?  Do you have any respiratory symptoms of shortness of breath or cough now or in the last 14 days?  Do you have any family members or close contacts with diagnosed or suspected Covid-19 in the past 14 days?  Have you been tested for Covid-19 and found to be positive?      Left message to call back

## 2019-08-08 ENCOUNTER — Other Ambulatory Visit: Payer: Self-pay

## 2019-08-08 ENCOUNTER — Encounter: Payer: Self-pay | Admitting: Gastroenterology

## 2019-08-08 ENCOUNTER — Ambulatory Visit (AMBULATORY_SURGERY_CENTER): Payer: Medicaid Other | Admitting: Gastroenterology

## 2019-08-08 VITALS — BP 129/91 | HR 76 | Temp 98.0°F | Resp 15 | Ht 69.0 in | Wt 170.0 lb

## 2019-08-08 DIAGNOSIS — D124 Benign neoplasm of descending colon: Secondary | ICD-10-CM

## 2019-08-08 DIAGNOSIS — Z1211 Encounter for screening for malignant neoplasm of colon: Secondary | ICD-10-CM

## 2019-08-08 DIAGNOSIS — D128 Benign neoplasm of rectum: Secondary | ICD-10-CM

## 2019-08-08 DIAGNOSIS — D122 Benign neoplasm of ascending colon: Secondary | ICD-10-CM | POA: Diagnosis not present

## 2019-08-08 DIAGNOSIS — G4733 Obstructive sleep apnea (adult) (pediatric): Secondary | ICD-10-CM | POA: Diagnosis not present

## 2019-08-08 DIAGNOSIS — D123 Benign neoplasm of transverse colon: Secondary | ICD-10-CM | POA: Diagnosis not present

## 2019-08-08 DIAGNOSIS — I2510 Atherosclerotic heart disease of native coronary artery without angina pectoris: Secondary | ICD-10-CM | POA: Diagnosis not present

## 2019-08-08 DIAGNOSIS — I10 Essential (primary) hypertension: Secondary | ICD-10-CM | POA: Diagnosis not present

## 2019-08-08 MED ORDER — SODIUM CHLORIDE 0.9 % IV SOLN: 500.0000 mL | Freq: Once | INTRAVENOUS | Status: DC

## 2019-08-08 NOTE — Progress Notes (Signed)
Called to room to assist during endoscopic procedure.  Patient ID and intended procedure confirmed with present staff. Received instructions for my participation in the procedure from the performing physician.  

## 2019-08-08 NOTE — Progress Notes (Signed)
Report to PACU, RN, vss, BBS= Clear.  

## 2019-08-08 NOTE — Patient Instructions (Addendum)
Thank you for allowing [place] to care for you today!  Await pathology results by mail, approximately 2 weeks.  Will recommend next colonoscopy date at that time, likely 3 years.  Resume previous diet and medications today.  Return to your normal activities tomorrow.      YOU HAD AN ENDOSCOPIC PROCEDURE TODAY AT [facility]:   Refer to the procedure report that was given to you for any specific questions about what was found during the examination.  If the procedure report does not answer your questions, please call your gastroenterologist to clarify.  If you requested that your care partner not be given the details of your procedure findings, then the procedure report has been included in a sealed envelope for you to review at your convenience later.  YOU SHOULD EXPECT: Some feelings of bloating in the abdomen. Passage of more gas than usual.  Walking can help get rid of the air that was put into your GI tract during the procedure and reduce the bloating. If you had a lower endoscopy (such as a colonoscopy or flexible sigmoidoscopy) you may notice spotting of blood in your stool or on the toilet paper. If you underwent a bowel prep for your procedure, you may not have a normal bowel movement for a few days.  Please Note:  You might notice some irritation and congestion in your nose or some drainage.  This is from the oxygen used during your procedure.  There is no need for concern and it should clear up in a day or so.  SYMPTOMS TO REPORT IMMEDIATELY:   Following lower endoscopy (colonoscopy or flexible sigmoidoscopy):  Excessive amounts of blood in the stool  Significant tenderness or worsening of abdominal pains  Swelling of the abdomen that is new, acute  Fever of 100F or higher  For urgent or emergent issues, a gastroenterologist can be reached at any hour by calling [phone].   DIET:  We do recommend a small meal at first, but then you may proceed to your regular  diet.  Drink plenty of fluids but you should avoid alcoholic beverages for 24 hours.  ACTIVITY:  You should plan to take it easy for the rest of today and you should NOT DRIVE or use heavy machinery until tomorrow (because of the sedation medicines used during the test).    FOLLOW UP: Our staff will call the number listed on your records 48-72 hours following your procedure to check on you and address any questions or concerns that you may have regarding the information given to you following your procedure. If we do not reach you, we will leave a message.  We will attempt to reach you two times.  During this call, we will ask if you have developed any symptoms of COVID 19. If you develop any symptoms (ie: fever, flu-like symptoms, shortness of breath, cough etc.) before then, please call [phone].  If you test positive for Covid 19 in the 2 weeks post procedure, please call and report this information to [place].    If any biopsies were taken you will be contacted by phone or by letter within the next 1-3 weeks.  Please call us at [phone] if you have not heard about the biopsies in 3 weeks.    SIGNATURES/CONFIDENTIALITY: You and/or your care partner have signed paperwork which will be entered into your electronic medical record.  These signatures attest to the fact that that the information above on your After Visit Summary  has been reviewed and is understood.  Full responsibility of the confidentiality of this discharge information lies with you and/or your care-partner. 

## 2019-08-08 NOTE — Op Note (Signed)
[facility] Patient Name: Kenneth Long Procedure Date: 08/08/2019 10:22 AM MRN: [id number] Endoscopist: Milus Banister , MD Age: 53 Referring MD:  Date of Birth: 08/13/1966 Gender: Male Account #: [id number] Procedure:                Colonoscopy Indications:              Screening for colorectal malignant neoplasm Medicines:                Monitored Anesthesia Care Procedure:                Pre-Anesthesia Assessment:                           - Prior to the procedure, a History and Physical                            was performed, and patient medications and                            allergies were reviewed. The patient's tolerance of                            previous anesthesia was also reviewed. The risks                            and benefits of the procedure and the sedation                            options and risks were discussed with the patient.                            All questions were answered, and informed consent                            was obtained. Prior Anticoagulants: The patient has                            taken no previous anticoagulant or antiplatelet                            agents. ASA Grade Assessment: II - A patient with                            mild systemic disease. After reviewing the risks                            and benefits, the patient was deemed in                            satisfactory condition to undergo the procedure.                           After obtaining informed consent, the colonoscope  was passed under direct vision. Throughout the                            procedure, the patient's blood pressure, pulse, and                            oxygen saturations were monitored continuously. The                            Colonoscope was introduced through the anus and                            advanced to the the cecum, identified by                            appendiceal orifice  and ileocecal valve. The                            colonoscopy was performed without difficulty. The                            patient tolerated the procedure well. The quality                            of the bowel preparation was good. The ileocecal                            valve, appendiceal orifice, and rectum were                            photographed. Scope In: 10:24:43 AM Scope Out: 10:41:59 AM Scope Withdrawal Time: 0 hours 13 minutes 54 seconds  Total Procedure Duration: 0 hours 17 minutes 16 seconds  Findings:                 A 12 mm polyp was found in the ascending colon. The                            polyp was sessile. The polyp was removed with a hot                            snare. Resection and retrieval were complete.                           Four sessile polyps were found in the rectum,                            descending colon and transverse colon. The polyps                            were 2 to 4 mm in size. These polyps were removed                            with a  cold snare. Resection and retrieval were                            complete.                           The exam was otherwise without abnormality on                            direct and retroflexion views. Complications:            No immediate complications. Estimated blood loss:                            None. Estimated Blood Loss:     Estimated blood loss: none. Impression:               - One 12 mm polyp in the ascending colon, removed                            with a hot snare. Resected and retrieved.                           - Four 2 to 4 mm polyps in the rectum, in the                            descending colon and in the transverse colon,                            removed with a cold snare. Resected and retrieved.                           - The examination was otherwise normal on direct                            and retroflexion views. Recommendation:           - Patient has a  contact number available for                            emergencies. The signs and symptoms of potential                            delayed complications were discussed with the                            patient. Return to normal activities tomorrow.                            Written discharge instructions were provided to the                            patient.                           - Resume previous diet.                           -  Continue present medications.                           - Repeat colonoscopy is recommended. The                            colonoscopy date will be determined after pathology                            results from today's exam become available for                            review. Likely 3 years. Milus Banister, MD 08/08/2019 10:46:08 AM This report has been signed electronically.

## 2019-08-08 NOTE — Progress Notes (Signed)
Pt's states no medical or surgical changes since previsit or office visit. 

## 2019-08-10 ENCOUNTER — Telehealth: Payer: Self-pay

## 2019-08-10 ENCOUNTER — Telehealth: Payer: Self-pay | Admitting: *Deleted

## 2019-08-10 NOTE — Telephone Encounter (Signed)
  Follow up Call-  Call back number 08/08/2019  Post procedure Call Back phone  # [phone]  Permission to leave phone message Yes  Some recent data might be hidden     Left message

## 2019-08-10 NOTE — Telephone Encounter (Signed)
  Follow up Call-  Call back number 08/08/2019  Post procedure Call Back phone  # [phone]  Permission to leave phone message Yes  Some recent data might be hidden     Patient questions:  Message left to call if necessary.

## 2019-08-15 ENCOUNTER — Encounter: Payer: Self-pay | Admitting: Gastroenterology

## 2019-09-10 ENCOUNTER — Encounter: Payer: Self-pay | Admitting: Internal Medicine

## 2019-11-20 NOTE — Telephone Encounter (Signed)
Notify patient I messaged with Dr Martinique and he recommended staying on the Vacepa and Atorvastatin, fish oil, but not adding any new meds

## 2019-11-21 NOTE — Telephone Encounter (Signed)
Left message for patient asking informing him of message. Asked patient to call back to let me know he received the message.

## 2020-01-30 ENCOUNTER — Ambulatory Visit (INDEPENDENT_AMBULATORY_CARE_PROVIDER_SITE_OTHER): Payer: Medicaid Other | Admitting: Internal Medicine

## 2020-01-30 ENCOUNTER — Other Ambulatory Visit: Payer: Self-pay

## 2020-01-30 ENCOUNTER — Encounter: Payer: Self-pay | Admitting: Internal Medicine

## 2020-01-30 VITALS — BP 130/78 | HR 86 | Resp 12 | Ht 69.0 in | Wt 160.0 lb

## 2020-01-30 DIAGNOSIS — E782 Mixed hyperlipidemia: Secondary | ICD-10-CM | POA: Diagnosis not present

## 2020-01-30 DIAGNOSIS — I10 Essential (primary) hypertension: Secondary | ICD-10-CM

## 2020-01-30 DIAGNOSIS — I2510 Atherosclerotic heart disease of native coronary artery without angina pectoris: Secondary | ICD-10-CM

## 2020-01-30 MED ORDER — ALBUTEROL SULFATE HFA 108 (90 BASE) MCG/ACT IN AERS: 2.0000 | INHALATION_SPRAY | Freq: Four times a day (QID) | RESPIRATORY_TRACT | 0 refills | Status: DC | PRN

## 2020-01-30 NOTE — Progress Notes (Signed)
    Subjective:    Patient ID: Kenneth Long, male   DOB: May 20, 1966, 54 y.o.   MRN: [id number]   HPI   1.  CAD:  No chest pain, but not very physically active.  He could walk every day.  Not doing so well with diet.   No edema of left leg (sometimes on right with his hip issues) No PND or orthopnea.  2.  Hyperlipidemia:  He is using Icosapent and Atorvastatin.  Has not had cholesterol checked in some time.   3.  Adenomatous colon polyps x 5  On 08/08/2019.  No dysplasia.  Needs repeat in August 2023.   4.  Right Hip:  Still with problems.  Planning to make an appt with Dr. Sherrian Divers as his previous ortho moved.   Current Meds  Medication Sig  . albuterol (VENTOLIN HFA) 108 (90 Base) MCG/ACT inhaler Inhale 2 puffs into the lungs every 6 (six) hours as needed for wheezing or shortness of breath.  [name] amLODipine (NORVASC) 10 MG tablet Take 1 tablet (10 mg total) by mouth daily.  [name] aspirin EC 81 MG tablet Take 1 tablet (81 mg total) by mouth daily.  [name] atorvastatin (LIPITOR) 80 MG tablet Take 1 tablet (80 mg total) by mouth daily.  . carvedilol (COREG) 25 MG tablet Take 1 tablet (25 mg total) by mouth 2 (two) times daily.  Vanessa Kick Ethyl (VASCEPA) 1 g CAPS Take 2 caps twice a day  . lisinopril-hydrochlorothiazide (PRINZIDE,ZESTORETIC) 20-25 MG tablet 1 tab by mouth daily  . mometasone (NASONEX) 50 MCG/ACT nasal spray 2 sprays each nostril daily  . tamsulosin (FLOMAX) 0.4 MG CAPS capsule 1 cap by mouth at bedtime.  . [DISCONTINUED] albuterol (PROVENTIL HFA;VENTOLIN HFA) 108 (90 Base) MCG/ACT inhaler Inhale 2 puffs into the lungs every 6 (six) hours as needed for wheezing or shortness of breath.   Allergies  Allergen Reactions  . Peanut-Containing Drug Products Anaphylaxis and Swelling  . Shellfish Allergy Anaphylaxis and Swelling  . Other     IV Lesiscan- tht iritation with a myocardial perf study 09-13-2018     Review of Systems    Objective:   Blood pressure 130/78, pulse  86, resp. rate 12, height 5\' 9"  (1.753 m), weight 160 lb (72.6 kg).  Physical Exam  NAD HEENT:  PERRL, EOMI,  TMs pearly gray Neck:  Supple, No adenopathy Chest:  CTA CV:  RRR with normal S1 and S2, No S3, S4 or murmur.  No carotid bruits.  Carotid, radial and DP pulses normal and equal Abd:  S, NT, No HSM or mass, + BS LE:  No edema.   Assessment & Plan   1.  CAD:  Stable.  Encouraged making goals for better eating habits and regular daily physical activity.  With his hip, would really encourage him to find a pool for water exercise--[place] or [place] when they open back up.  2.  Hyperlipidemia:  He will return for fasting labs in 1 week.  3.  Hypertension:  Fair control.  FLP, CBC, CMP and bp check in 1 weeks.

## 2020-02-08 ENCOUNTER — Other Ambulatory Visit: Payer: Medicaid Other

## 2020-02-12 ENCOUNTER — Other Ambulatory Visit: Payer: Self-pay

## 2020-02-12 ENCOUNTER — Other Ambulatory Visit (INDEPENDENT_AMBULATORY_CARE_PROVIDER_SITE_OTHER): Payer: Medicaid Other

## 2020-02-12 VITALS — BP 122/84 | HR 88

## 2020-02-12 DIAGNOSIS — Z79899 Other long term (current) drug therapy: Secondary | ICD-10-CM

## 2020-02-12 DIAGNOSIS — I10 Essential (primary) hypertension: Secondary | ICD-10-CM | POA: Diagnosis not present

## 2020-02-12 DIAGNOSIS — E785 Hyperlipidemia, unspecified: Secondary | ICD-10-CM | POA: Diagnosis not present

## 2020-02-12 NOTE — Progress Notes (Signed)
Patient BP in normal range. Informed to continue current dose of medication

## 2020-02-13 LAB — LIPID PANEL W/O CHOL/HDL RATIO
Cholesterol, Total: 185 mg/dL (ref 100–199)
HDL: 44 mg/dL (ref >39)
LDL Chol Calc (NIH): 80 mg/dL (ref 0–99)
Triglycerides: 378 mg/dL — ABNORMAL HIGH (ref 0–149)
VLDL Cholesterol Cal: 61 mg/dL — ABNORMAL HIGH (ref 5–40)

## 2020-02-13 LAB — COMPREHENSIVE METABOLIC PANEL
ALT: 44 IU/L (ref 0–44)
AST: 39 IU/L (ref 0–40)
Albumin/Globulin Ratio: 1.6 (ref 1.2–2.2)
Albumin: 4.6 g/dL (ref 3.8–4.9)
Alkaline Phosphatase: 79 IU/L (ref 39–117)
BUN/Creatinine Ratio: 13 (ref 9–20)
BUN: 12 mg/dL (ref 6–24)
Bilirubin Total: 0.4 mg/dL (ref 0.0–1.2)
CO2: 21 mmol/L (ref 20–29)
Calcium: 9.9 mg/dL (ref 8.7–10.2)
Chloride: 101 mmol/L (ref 96–106)
Creatinine, Ser: 0.94 mg/dL (ref 0.76–1.27)
GFR calc Af Amer: 107 mL/min/{1.73_m2} (ref >59)
GFR calc non Af Amer: 93 mL/min/{1.73_m2} (ref >59)
Globulin, Total: 2.9 g/dL (ref 1.5–4.5)
Glucose: 91 mg/dL (ref 65–99)
Potassium: 3.5 mmol/L (ref 3.5–5.2)
Sodium: 139 mmol/L (ref 134–144)
Total Protein: 7.5 g/dL (ref 6.0–8.5)

## 2020-02-13 LAB — CBC WITH DIFFERENTIAL/PLATELET
Basophils Absolute: 0.1 10*3/uL (ref 0.0–0.2)
Basos: 1 %
EOS (ABSOLUTE): 0.2 10*3/uL (ref 0.0–0.4)
Eos: 2 %
Hematocrit: 39.8 % (ref 37.5–51.0)
Hemoglobin: 13.6 g/dL (ref 13.0–17.7)
Immature Grans (Abs): 0 10*3/uL (ref 0.0–0.1)
Immature Granulocytes: 0 %
Lymphocytes Absolute: 3.2 10*3/uL — ABNORMAL HIGH (ref 0.7–3.1)
Lymphs: 35 %
MCH: 30.6 pg (ref 26.6–33.0)
MCHC: 34.2 g/dL (ref 31.5–35.7)
MCV: 89 fL (ref 79–97)
Monocytes Absolute: 0.6 10*3/uL (ref 0.1–0.9)
Monocytes: 6 %
Neutrophils Absolute: 5.2 10*3/uL (ref 1.4–7.0)
Neutrophils: 56 %
Platelets: 375 10*3/uL (ref 150–450)
RBC: 4.45 x10E6/uL (ref 4.14–5.80)
RDW: 13.1 % (ref 11.6–15.4)
WBC: 9.2 10*3/uL (ref 3.4–10.8)

## 2020-02-14 NOTE — Progress Notes (Signed)
Cardiology Office Note   Date:  02/18/2020   ID:  Kenneth Long, DOB 07-01-1966, MRN [id number]  PCP:  Mack Hook, MD  Cardiologist:   Emberlyn Burlison [place], MD   Chief Complaint  Patient presents with  . Coronary Artery Disease      History of Present Illness: Kenneth Long is a 54 y.o. male who presents for follow up CAD. He was admitted in March 2017 with an inferior STEMI. He underwent cardiac cath which demonstrated occlusion of the second OM. Otherwise nonobstructive CAD. The OM was stented with a DES. EF by cath was low normal. Echo showed EF 40-45% without regional WMA.  He also has a history of HTN, asthma, and tobacco abuse.    He underwent right THR in September 99991111 without complications.   Last seen in September 2019 when he  noted symptoms of chest pain when he was outside in the hot sun. He had a Myoview study showing some scar but no ischemia. EF stable at 45%.   On follow up today he is doing very well. Denies any chest pain, dyspnea, palpitations, edema. He does note he sweats a lot at night. Tolerating medication well.    Past Medical History:  Diagnosis Date  . Allergy    SEASONAL  . Arthritis   . Asthma   . Colon adenomas 08/07/2019   Dr. Ardis Hughs 5 Tubular adenomas without high grade dysplasia.  . Coronary artery disease   . Dyspnea    "sometimes" with exertion   . Headache   . Heart murmur   . Hyperlipidemia   . Hypertension   . Sleep apnea    no CPAP  . ST elevation (STEMI) myocardial infarction involving left circumflex coronary artery ([place]) 03/16/2016   DES CFX  . Umbilical hernia     Past Surgical History:  Procedure Laterality Date  . CARDIAC CATHETERIZATION N/A 03/16/2016   Procedure: Left Heart Cath and Coronary Angiography;  Surgeon: [name], MD;  LAD OK, CFX 100%, OM1 20%, RCA 20%, EF nl  . CARDIAC CATHETERIZATION N/A 03/16/2016   Procedure: Coronary Stent Intervention;  Surgeon: Carolene [name], MD; PROMUS PREM MR  3.0X16 mm DES   . TOTAL HIP ARTHROPLASTY Right 08/24/2017   Procedure: RIGHT TOTAL HIP ARTHROPLASTY ANTERIOR APPROACH;  Surgeon: Leandrew Koyanagi, MD;  Location: [facility];  Service: Orthopedics;  Laterality: Right;  RIGHT TOTAL HIP ARTHROPLASTY ANTERIOR APPROACH  . UMBILICAL HERNIA REPAIR  [id number]   umbilical hernia repair     Current Outpatient Medications  Medication Sig Dispense Refill  . albuterol (VENTOLIN HFA) 108 (90 Base) MCG/ACT inhaler Inhale 2 puffs into the lungs every 6 (six) hours as needed for wheezing or shortness of breath. 18 g 0  . amLODipine (NORVASC) 10 MG tablet Take 1 tablet (10 mg total) by mouth daily. 30 tablet 11  . aspirin EC 81 MG tablet Take 1 tablet (81 mg total) by mouth daily. 90 tablet 3  . atorvastatin (LIPITOR) 80 MG tablet Take 1 tablet (80 mg total) by mouth daily. 30 tablet 7  . cetirizine (ZYRTEC) 10 MG tablet Take 1 tablet (10 mg total) by mouth daily. 30 tablet 11  . Icosapent Ethyl (VASCEPA) 1 g CAPS Take 2 caps twice a day 120 capsule 11  . lisinopril-hydrochlorothiazide (PRINZIDE,ZESTORETIC) 20-25 MG tablet 1 tab by mouth daily 30 tablet 11  . mometasone (NASONEX) 50 MCG/ACT nasal spray 2 sprays each nostril daily 17 g 12  . nitroGLYCERIN (  NITROSTAT) 0.4 MG SL tablet Place 1 tablet (0.4 mg total) under the tongue every 5 (five) minutes as needed for chest pain. 25 tablet 12  . tamsulosin (FLOMAX) 0.4 MG CAPS capsule 1 cap by mouth at bedtime. 30 capsule 11  . carvedilol (COREG) 25 MG tablet Take 1 tablet (25 mg total) by mouth 2 (two) times daily. 180 tablet 3   No current facility-administered medications for this visit.    Allergies:   Peanut-containing drug products, Shellfish allergy, and Other    Social History:  The patient  reports that he quit smoking about 3 years ago. His smoking use included cigarettes. He has never used smokeless tobacco. He reports current alcohol use. He reports that he does not use drugs.   Family History:  The patient's  family history includes Hypertension in his father, mother, sister, and sister; Sarcoidosis in his mother.    ROS:  Please see the history of present illness.   Otherwise, review of systems are positive for none.   All other systems are reviewed and negative.    PHYSICAL EXAM: VS:  BP 137/90   Pulse 78   Temp (!) 97.3 F (36.3 C)   Ht 5\' 9"  (1.753 m)   Wt 164 lb (74.4 kg)   SpO2 93%   BMI 24.22 kg/m  , BMI Body mass index is 24.22 kg/m.  GENERAL:  Well appearing BM in NAD HEENT:  PERRL, EOMI, sclera are clear. Oropharynx is clear. NECK:  No jugular venous distention, carotid upstroke brisk and symmetric, no bruits, no thyromegaly or adenopathy LUNGS:  Clear to auscultation bilaterally CHEST:  Unremarkable HEART:  RRR,  PMI not displaced or sustained,S1 and S2 within normal limits, no S3, no S4: no clicks, no rubs, no murmurs ABD:  Soft, nontender. BS +, no masses or bruits. No hepatomegaly, no splenomegaly EXT:  2 + pulses throughout, no edema, no cyanosis no clubbing SKIN:  Warm and dry.  No rashes NEURO:  Alert and oriented x 3. Cranial nerves II through XII intact. PSYCH:  Cognitively intact   Recent Labs: 02/12/2020: ALT 44; BUN 12; Creatinine, Ser 0.94; Hemoglobin 13.6; Platelets 375; Potassium 3.5; Sodium 139    Lipid Panel    Component Value Date/Time   CHOL 185 02/12/2020 1005   TRIG 378 (H) 02/12/2020 1005   HDL 44 02/12/2020 1005   CHOLHDL 6.5 03/17/2016 0407   VLDL 76 (H) 03/17/2016 0407   LDLCALC 80 02/12/2020 1005      Wt Readings from Last 3 Encounters:  02/18/20 164 lb (74.4 kg)  01/30/20 160 lb (72.6 kg)  08/08/19 170 lb (77.1 kg)    Ecg today shows NSR with LVH. Old inferior infarct. T wave inversion in lateral leads. I have personally reviewed and interpreted this study.   Other studies Reviewed: Echo: 03/17/16: Study Conclusions  - Left ventricle: Posterior and lateral wall hypokinesis The cavity   size was moderately dilated. Systolic  function was mildly to   moderately reduced. The estimated ejection fraction was in the   range of 40% to 45%. Wall motion was normal; there were no   regional wall motion abnormalities. - Mitral valve: There was mild regurgitation. - Left atrium: The atrium was mildly dilated. - Atrial septum: No defect or patent foramen ovale was identified.   Cardiac cath 03/16/16: Conclusion    Prox RCA to Mid RCA lesion, 20% stenosed.  1st Mrg lesion, 20% stenosed.  The left ventricular systolic function is normal.  2nd Mrg lesion, 100% stenosed. Post intervention, there is a 0% residual stenosis.   1. Single vessel occlusive CAD 2. Low normal LV function 3. Successful stenting of the second OM with a DES  Plan: DAPT for one year. Patient may be a candidate for fast track discharge if no complications.     Myoview 09/13/18: Study Highlights   Nuclear stress EF: 45%.  The left ventricular ejection fraction is mildly decreased (45-54%).  There was no ST segment deviation noted during stress.  No T wave inversion was noted during stress.  Defect 1: There is a small defect of severe severity present in the basal inferolateral and mid inferolateral location.  Findings consistent with prior myocardial infarction.  This is an intermediate risk study due to reduced systolic function. There is no ischemia.       ASSESSMENT AND PLAN:  1. CAD s/p STEMI in March 2017 with DES of OM. Myoview in 2019 showed scar but no ischemia. He is asymptomatic.  Continue ASA, statin, beta blocker.    2. Hyperlipidemia- mixed. On high dose statin and Vascepa. Weight is controlled. Triglycerides still elevated.   Focus on healthy eating and weight loss.  3. HTN improved with switch to Coreg. Continue current therapy.    Disposition:   FU with me in one year.  Signed, Linet Brash Martinique, MD  02/18/2020 10:22 AM    [facility] [address], [place], [place], [address] Phone  [phone], Fax [phone]

## 2020-02-18 ENCOUNTER — Encounter: Payer: Self-pay | Admitting: Cardiology

## 2020-02-18 ENCOUNTER — Other Ambulatory Visit: Payer: Self-pay

## 2020-02-18 ENCOUNTER — Ambulatory Visit: Payer: Medicaid Other | Admitting: Cardiology

## 2020-02-18 VITALS — BP 137/90 | HR 78 | Temp 97.3°F | Ht 69.0 in | Wt 164.0 lb

## 2020-02-18 DIAGNOSIS — E785 Hyperlipidemia, unspecified: Secondary | ICD-10-CM | POA: Diagnosis not present

## 2020-02-18 DIAGNOSIS — I10 Essential (primary) hypertension: Secondary | ICD-10-CM | POA: Diagnosis not present

## 2020-02-18 DIAGNOSIS — I2510 Atherosclerotic heart disease of native coronary artery without angina pectoris: Secondary | ICD-10-CM

## 2020-03-04 DIAGNOSIS — Z1152 Encounter for screening for COVID-19: Secondary | ICD-10-CM | POA: Diagnosis not present

## 2020-04-17 DIAGNOSIS — Z23 Encounter for immunization: Secondary | ICD-10-CM | POA: Diagnosis not present

## 2020-05-08 DIAGNOSIS — Z23 Encounter for immunization: Secondary | ICD-10-CM | POA: Diagnosis not present

## 2020-05-12 ENCOUNTER — Other Ambulatory Visit: Payer: Self-pay

## 2020-05-12 DIAGNOSIS — I10 Essential (primary) hypertension: Secondary | ICD-10-CM

## 2020-05-12 MED ORDER — CETIRIZINE HCL 10 MG PO TABS: 10.0000 mg | ORAL_TABLET | Freq: Every day | ORAL | 11 refills | Status: DC

## 2020-05-12 MED ORDER — TAMSULOSIN HCL 0.4 MG PO CAPS: ORAL_CAPSULE | ORAL | 11 refills | Status: DC

## 2020-05-12 MED ORDER — ATORVASTATIN CALCIUM 80 MG PO TABS: 80.0000 mg | ORAL_TABLET | Freq: Every day | ORAL | 7 refills | Status: DC

## 2020-05-12 MED ORDER — LISINOPRIL-HYDROCHLOROTHIAZIDE 20-25 MG PO TABS: ORAL_TABLET | ORAL | 11 refills | Status: DC

## 2020-05-12 MED ORDER — CARVEDILOL 25 MG PO TABS: 25.0000 mg | ORAL_TABLET | Freq: Two times a day (BID) | ORAL | 3 refills | Status: DC

## 2020-05-12 MED ORDER — MOMETASONE FUROATE 50 MCG/ACT NA SUSP: NASAL | 12 refills | Status: DC

## 2020-05-12 MED ORDER — AMLODIPINE BESYLATE 10 MG PO TABS: 10.0000 mg | ORAL_TABLET | Freq: Every day | ORAL | 11 refills | Status: DC

## 2020-07-30 ENCOUNTER — Encounter: Payer: Medicaid Other | Admitting: Internal Medicine

## 2020-09-22 ENCOUNTER — Ambulatory Visit (INDEPENDENT_AMBULATORY_CARE_PROVIDER_SITE_OTHER): Payer: Medicaid Other | Admitting: Internal Medicine

## 2020-09-22 ENCOUNTER — Encounter: Payer: Self-pay | Admitting: Internal Medicine

## 2020-09-22 VITALS — BP 108/78 | HR 67 | Resp 16 | Ht 69.0 in | Wt 156.0 lb

## 2020-09-22 DIAGNOSIS — I10 Essential (primary) hypertension: Secondary | ICD-10-CM

## 2020-09-22 DIAGNOSIS — I2510 Atherosclerotic heart disease of native coronary artery without angina pectoris: Secondary | ICD-10-CM | POA: Diagnosis not present

## 2020-09-22 DIAGNOSIS — J302 Other seasonal allergic rhinitis: Secondary | ICD-10-CM | POA: Diagnosis not present

## 2020-09-22 DIAGNOSIS — E782 Mixed hyperlipidemia: Secondary | ICD-10-CM | POA: Diagnosis not present

## 2020-09-22 DIAGNOSIS — I499 Cardiac arrhythmia, unspecified: Secondary | ICD-10-CM

## 2020-09-22 MED ORDER — LORATADINE 10 MG PO TABS: 10.0000 mg | ORAL_TABLET | Freq: Every day | ORAL | 11 refills | Status: DC

## 2020-09-22 MED ORDER — OLOPATADINE HCL 0.1 % OP SOLN: 1.0000 [drp] | Freq: Two times a day (BID) | OPHTHALMIC | 12 refills | Status: DC

## 2020-09-22 NOTE — Patient Instructions (Signed)
Get your flu vaccine at the pharmacy ASAP  [facility] booster in November.  Call [facility] for the [employer] beginning of November

## 2020-09-22 NOTE — Progress Notes (Signed)
Subjective:    Patient ID: Kenneth Long, male   DOB: February 18, 1966, 54 y.o.   MRN: [id number]   HPI   Follow up--was scheduled for CPE, but had many issues to discuss instead.  1.  CAD with STEMI in 02/2016 and DES of OM.  Followed by Dr. Peter Martinique, cardiology.  Saw him last 02/2020 and was stable.  No chest discomfort. He is short of breath, but seems related to his allergies.  When uses rescue inhaler, he breathes better. Has had skipping heartbeat here and there for about 1 year.  Feels normal at this point to him. Had ECG in March with Dr. Martinique and no skipped beats or pauses noted.     2.  Dyspnea:  Eyes watering and itching.  He is sneezing a lot with lots of posterior pharyngeal drainage.  Throat is sore.  When nose is congested, he mouth breathes.  Worse in the morning.  He has been told he snores when he is congested as well.  Using Albuterol inhaler 3 times weekly and has been worse since returning from [place] in March, but he states summer and fall are his worst times for his allergy symptoms.  He is also remodeling his home and the dust level is high.   Vacuums on a regular basis.  Changes the air filters every 3 months. Has not been using the Nasonex for about 1 year.  He cannot recall how he paid for it previously. Currently using Zyrtec and has been taking it for years.   3.  Hypertension:  Has been controlled on Carvedilol, Amlodipine.  Also on Tamsulosin for BPH, which may help as well.    4.  Hyperlipidemia:  Feels he is eating better.  Active caring for his granddaughter.  Continues on Icosapent and Atorvastatin. ..  Current Meds  Medication Sig  . albuterol (VENTOLIN HFA) 108 (90 Base) MCG/ACT inhaler Inhale 2 puffs into the lungs every 6 (six) hours as needed for wheezing or shortness of breath.  [name] amLODipine (NORVASC) 10 MG tablet Take 1 tablet (10 mg total) by mouth daily.  [name] aspirin EC 81 MG tablet Take 1 tablet (81 mg total) by mouth daily.  [name] atorvastatin  (LIPITOR) 80 MG tablet Take 1 tablet (80 mg total) by mouth daily.  . carvedilol (COREG) 25 MG tablet Take 1 tablet (25 mg total) by mouth 2 (two) times daily.  . cetirizine (ZYRTEC) 10 MG tablet Take 1 tablet (10 mg total) by mouth daily.  Vanessa Kick Ethyl (VASCEPA) 1 g CAPS Take 2 caps twice a day  . lisinopril-hydrochlorothiazide (ZESTORETIC) 20-25 MG tablet 1 tab by mouth daily  . mometasone (NASONEX) 50 MCG/ACT nasal spray 2 sprays each nostril daily  . nitroGLYCERIN (NITROSTAT) 0.4 MG SL tablet Place 1 tablet (0.4 mg total) under the tongue every 5 (five) minutes as needed for chest pain.  . tamsulosin (FLOMAX) 0.4 MG CAPS capsule 1 cap by mouth at bedtime.   Allergies  Allergen Reactions  . Peanut-Containing Drug Products Anaphylaxis and Swelling  . Shellfish Allergy Anaphylaxis and Swelling  . Other     IV Lesiscan- tht iritation with a myocardial perf study 09-13-2018     Review of Systems    Objective:   BP 108/78 (BP Location: Left Arm, Patient Position: Sitting, Cuff Size: Normal)   Pulse 67   Resp 16   Ht 5\' 9"  (1.753 m)   Wt 156 lb (70.8 kg)   BMI 23.04 kg/m  Physical Exam  NAD HEENT:  PERRL, EOMI, mildly injected conjunctivae bilaterally.  Eyes watering-clear.  TMs pearly gray, unable to see posterior pharynx well, but tonsils without injection.  Nasal mucosa boggy with clear discharge. Neck:  Supple, No adenopathy Chest:  CTA CV:  RRR with occasional skipped beat or pause.  Normal S1 and S2, No S3, S4 or murmur.  No carotid bruits.  Carotid, radial and DP/PT pulses normal and equal.  No LE edema. Abd:  S, NT, No HSM or mass, + BS  ECG on rhythm strip shows what appears to be PVC every 5th beat.  12 lead ECG however unchanged from 2018 ECG. Assessment & Plan  1.  CAD with history of AMI and placement of DES in 2017:  Remains stable.  Does appear to have occasional PVCs.  To notify if skipped beats seem to worsen.  By his history this is stable.  2.   Allergies:  To switch antihistamines--try Loratadine 10 mg daily.  Also to restart nasal corticosteroids with Nasonex and to start Pataday eye drops as needed as well.  3.  HYperlipidemia:  FLP in 2 weeks with fasting labs.    4.  Hypertension:  controlled  5.  HM:  Flu vaccine at [facility].  To return in November for [place] booster--[employer]. HIV and Hepatitis C screen with fasting labs as well.

## 2020-09-24 DIAGNOSIS — I499 Cardiac arrhythmia, unspecified: Secondary | ICD-10-CM | POA: Insufficient documentation

## 2020-10-06 ENCOUNTER — Other Ambulatory Visit: Payer: Medicaid Other

## 2020-10-06 DIAGNOSIS — E782 Mixed hyperlipidemia: Secondary | ICD-10-CM

## 2020-10-06 DIAGNOSIS — Z7251 High risk heterosexual behavior: Secondary | ICD-10-CM | POA: Diagnosis not present

## 2020-10-07 LAB — LIPID PANEL W/O CHOL/HDL RATIO
Cholesterol, Total: 113 mg/dL (ref 100–199)
HDL: 32 mg/dL — ABNORMAL LOW (ref >39)
LDL Chol Calc (NIH): 6 mg/dL (ref 0–99)
Triglycerides: 602 mg/dL (ref 0–149)
VLDL Cholesterol Cal: 75 mg/dL — ABNORMAL HIGH (ref 5–40)

## 2020-10-07 LAB — HIV ANTIBODY (ROUTINE TESTING W REFLEX): HIV Screen 4th Generation wRfx: NONREACTIVE

## 2020-10-07 LAB — HEPATITIS C ANTIBODY: Hep C Virus Ab: <0.1 s/co ratio (ref 0.0–0.9)

## 2020-10-27 ENCOUNTER — Other Ambulatory Visit: Payer: Self-pay | Admitting: Internal Medicine

## 2021-02-03 ENCOUNTER — Encounter: Payer: Medicaid Other | Admitting: Internal Medicine

## 2021-03-27 ENCOUNTER — Ambulatory Visit: Payer: Medicaid Other | Admitting: Cardiology

## 2021-05-11 ENCOUNTER — Ambulatory Visit: Payer: Medicaid Other | Admitting: Cardiology

## 2021-06-24 NOTE — Progress Notes (Deleted)
Cardiology Office Note   Date:  06/24/2021   ID:  [name], DOB 12-23-1965, MRN [id number]  PCP:  Kenneth Hook, MD  Cardiologist:   Kenneth Devivo Martinique, MD   No chief complaint on file.     History of Present Illness: Kenneth Long is a 55 y.o. male who presents for follow up CAD. He was admitted in March 2017 with an inferior STEMI. He underwent cardiac cath which demonstrated occlusion of the second OM. Otherwise nonobstructive CAD. The OM was stented with a DES. EF by cath was low normal. Echo showed EF 40-45% without regional WMA.  He also has a history of HTN, asthma, and tobacco abuse.    He underwent right THR in September 5638 without complications.   Last seen in September 2019 when he  noted symptoms of chest pain when he was outside in the hot sun. He had a Myoview study showing some scar but no ischemia. EF stable at 45%.   On follow up today he is doing very well. Denies any chest pain, dyspnea, palpitations, edema. He does note he sweats a lot at night. Tolerating medication well.    Past Medical History:  Diagnosis Date   Allergy    SEASONAL   Arthritis    Asthma    Colon adenomas 08/07/2019   Dr. Ardis Long 5 Tubular adenomas without high grade dysplasia.   Coronary artery disease    Dyspnea    "sometimes" with exertion    Headache    Heart murmur    Hyperlipidemia    Hypertension    Sleep apnea    no CPAP   ST elevation (STEMI) myocardial infarction involving left circumflex coronary artery ([place]) 03/16/2016   DES CFX   Umbilical hernia     Past Surgical History:  Procedure Laterality Date   CARDIAC CATHETERIZATION N/A 03/16/2016   Procedure: Left Heart Cath and Coronary Angiography;  Surgeon: Kenneth Buchmann M Martinique, MD;  LAD OK, CFX 100%, OM1 20%, RCA 20%, EF nl   CARDIAC CATHETERIZATION N/A 03/16/2016   Procedure: Coronary Stent Intervention;  Surgeon: Kenneth Kaylor M Martinique, MD; PROMUS PREM MR 3.0X16 mm DES    TOTAL HIP ARTHROPLASTY Right 08/24/2017    Procedure: RIGHT TOTAL HIP ARTHROPLASTY ANTERIOR APPROACH;  Surgeon: Kenneth Koyanagi, MD;  Location: [facility];  Service: Orthopedics;  Laterality: Right;  RIGHT TOTAL HIP ARTHROPLASTY ANTERIOR APPROACH   UMBILICAL HERNIA REPAIR  7564   umbilical hernia repair     Current Outpatient Medications  Medication Sig Dispense Refill   albuterol (VENTOLIN HFA) 108 (90 Base) MCG/ACT inhaler Inhale 2 puffs into the lungs every 6 (six) hours as needed for wheezing or shortness of breath. 18 g 0   amLODipine (NORVASC) 10 MG tablet Take 1 tablet (10 mg total) by mouth daily. 30 tablet 11   aspirin EC 81 MG tablet Take 1 tablet (81 mg total) by mouth daily. 90 tablet 3   atorvastatin (LIPITOR) 80 MG tablet Take 1 tablet (80 mg total) by mouth daily. 30 tablet 7   carvedilol (COREG) 25 MG tablet Take 1 tablet (25 mg total) by mouth 2 (two) times daily. 180 tablet 3   lisinopril-hydrochlorothiazide (ZESTORETIC) 20-25 MG tablet 1 tab by mouth daily 30 tablet 11   loratadine (CLARITIN) 10 MG tablet Take 1 tablet (10 mg total) by mouth daily. 30 tablet 11   mometasone (NASONEX) 50 MCG/ACT nasal spray 2 sprays each nostril daily 17 g 12   nitroGLYCERIN (NITROSTAT) 0.4  MG SL tablet Place 1 tablet (0.4 mg total) under the tongue every 5 (five) minutes as needed for chest pain. 25 tablet 12   olopatadine (PATADAY) 0.1 % ophthalmic solution Place 1 drop into both eyes 2 (two) times daily. 5 mL 12   tamsulosin (FLOMAX) 0.4 MG CAPS capsule 1 cap by mouth at bedtime. 30 capsule 11   VASCEPA 1 g capsule Take 2 capsules by mouth twice daily 120 capsule 10   No current facility-administered medications for this visit.    Allergies:   Peanut-containing drug products, Shellfish allergy, and Other    Social History:  The patient  reports that he quit smoking about 5 years ago. His smoking use included cigarettes. He has never used smokeless tobacco. He reports current alcohol use. He reports that he does not use drugs.    Family History:  The patient's family history includes Hypertension in his father, mother, sister, and sister; Sarcoidosis in his mother.    ROS:  Please see the history of present illness.   Otherwise, review of systems are positive for none.   All other systems are reviewed and negative.    PHYSICAL EXAM: VS:  There were no vitals taken for this visit. , BMI There is no height or weight on file to calculate BMI.  GENERAL:  Well appearing BM in NAD HEENT:  PERRL, EOMI, sclera are clear. Oropharynx is clear. NECK:  No jugular venous distention, carotid upstroke brisk and symmetric, no bruits, no thyromegaly or adenopathy LUNGS:  Clear to auscultation bilaterally CHEST:  Unremarkable HEART:  RRR,  PMI not displaced or sustained,S1 and S2 within normal limits, no S3, no S4: no clicks, no rubs, no murmurs ABD:  Soft, nontender. BS +, no masses or bruits. No hepatomegaly, no splenomegaly EXT:  2 + pulses throughout, no edema, no cyanosis no clubbing SKIN:  Warm and dry.  No rashes NEURO:  Alert and oriented x 3. Cranial nerves II through XII intact. PSYCH:  Cognitively intact   Recent Labs: No results found for requested labs within last 8760 hours.    Lipid Panel    Component Value Date/Time   CHOL 113 10/06/2020 0907   TRIG 602 (HH) 10/06/2020 0907   HDL 32 (L) 10/06/2020 0907   CHOLHDL 6.5 03/17/2016 0407   VLDL 76 (H) 03/17/2016 0407   LDLCALC 6 10/06/2020 0907      Wt Readings from Last 3 Encounters:  09/22/20 156 lb (70.8 kg)  02/18/20 164 lb (74.4 kg)  01/30/20 160 lb (72.6 kg)    Ecg today shows NSR with LVH. Old inferior infarct. T wave inversion in lateral leads. I have personally reviewed and interpreted this study.   Other studies Reviewed: Echo: 03/17/16: Study Conclusions   - Left ventricle: Posterior and lateral wall hypokinesis The cavity   size was moderately dilated. Systolic function was mildly to   moderately reduced. The estimated ejection  fraction was in the   range of 40% to 45%. Wall motion was normal; there were no   regional wall motion abnormalities. - Mitral valve: There was mild regurgitation. - Left atrium: The atrium was mildly dilated. - Atrial septum: No defect or patent foramen ovale was identified.    Cardiac cath 03/16/16: Conclusion   Prox RCA to Mid RCA lesion, 20% stenosed. 1st Mrg lesion, 20% stenosed. The left ventricular systolic function is normal. 2nd Mrg lesion, 100% stenosed. Post intervention, there is a 0% residual stenosis.   1. Single vessel occlusive  CAD 2. Low normal LV function 3. Successful stenting of the second OM with a DES   Plan: DAPT for one year. Patient may be a candidate for fast track discharge if no complications.     Myoview 09/13/18: Study Highlights  Nuclear stress EF: 45%. The left ventricular ejection fraction is mildly decreased (45-54%). There was no ST segment deviation noted during stress. No T wave inversion was noted during stress. Defect 1: There is a small defect of severe severity present in the basal inferolateral and mid inferolateral location. Findings consistent with prior myocardial infarction. This is an intermediate risk study due to reduced systolic function. There is no ischemia.       ASSESSMENT AND PLAN:  1. CAD s/p STEMI in March 2017 with DES of OM. Myoview in 2019 showed scar but no ischemia. He is asymptomatic.  Continue ASA, statin, beta blocker.    2. Hyperlipidemia- mixed. On high dose statin and Vascepa. Weight is controlled. Triglycerides still elevated.   Focus on healthy eating and weight loss.  3. HTN improved with switch to Coreg. Continue current therapy.    Disposition:   FU with me in one year.  Signed, Ramond Darnell Martinique, MD  06/24/2021 3:36 PM    [facility] [address], [place], [place], [address] Phone [phone], Fax [phone]

## 2021-06-26 ENCOUNTER — Ambulatory Visit: Payer: Medicaid Other | Admitting: Cardiology

## 2021-07-01 ENCOUNTER — Encounter: Payer: Medicaid Other | Admitting: Internal Medicine

## 2021-11-08 ENCOUNTER — Emergency Department (HOSPITAL_COMMUNITY): Payer: Medicaid Other

## 2021-11-08 ENCOUNTER — Observation Stay (HOSPITAL_COMMUNITY)
Admission: EM | Admit: 2021-11-08 | Discharge: 2021-11-09 | Disposition: A | Discharge status: Home / Self Care | Payer: Medicaid Other | Attending: Cardiology | Admitting: Cardiology

## 2021-11-08 ENCOUNTER — Observation Stay (HOSPITAL_BASED_OUTPATIENT_CLINIC_OR_DEPARTMENT_OTHER): Payer: Medicaid Other

## 2021-11-08 ENCOUNTER — Encounter (HOSPITAL_COMMUNITY): Payer: Self-pay

## 2021-11-08 DIAGNOSIS — J45909 Unspecified asthma, uncomplicated: Secondary | ICD-10-CM | POA: Insufficient documentation

## 2021-11-08 DIAGNOSIS — Q272 Other congenital malformations of renal artery: Secondary | ICD-10-CM | POA: Diagnosis not present

## 2021-11-08 DIAGNOSIS — I2510 Atherosclerotic heart disease of native coronary artery without angina pectoris: Secondary | ICD-10-CM | POA: Insufficient documentation

## 2021-11-08 DIAGNOSIS — I959 Hypotension, unspecified: Secondary | ICD-10-CM | POA: Insufficient documentation

## 2021-11-08 DIAGNOSIS — Z7982 Long term (current) use of aspirin: Secondary | ICD-10-CM | POA: Diagnosis not present

## 2021-11-08 DIAGNOSIS — R0602 Shortness of breath: Secondary | ICD-10-CM | POA: Diagnosis not present

## 2021-11-08 DIAGNOSIS — R079 Chest pain, unspecified: Secondary | ICD-10-CM

## 2021-11-08 DIAGNOSIS — J432 Centrilobular emphysema: Secondary | ICD-10-CM | POA: Diagnosis not present

## 2021-11-08 DIAGNOSIS — F1910 Other psychoactive substance abuse, uncomplicated: Secondary | ICD-10-CM

## 2021-11-08 DIAGNOSIS — E876 Hypokalemia: Secondary | ICD-10-CM | POA: Insufficient documentation

## 2021-11-08 DIAGNOSIS — R110 Nausea: Secondary | ICD-10-CM | POA: Diagnosis not present

## 2021-11-08 DIAGNOSIS — Z87891 Personal history of nicotine dependence: Secondary | ICD-10-CM | POA: Insufficient documentation

## 2021-11-08 DIAGNOSIS — Z20822 Contact with and (suspected) exposure to covid-19: Secondary | ICD-10-CM | POA: Diagnosis not present

## 2021-11-08 DIAGNOSIS — Y900 Blood alcohol level of less than 20 mg/100 ml: Secondary | ICD-10-CM | POA: Insufficient documentation

## 2021-11-08 DIAGNOSIS — E785 Hyperlipidemia, unspecified: Secondary | ICD-10-CM | POA: Diagnosis not present

## 2021-11-08 DIAGNOSIS — I517 Cardiomegaly: Secondary | ICD-10-CM | POA: Diagnosis not present

## 2021-11-08 DIAGNOSIS — Z79899 Other long term (current) drug therapy: Secondary | ICD-10-CM | POA: Insufficient documentation

## 2021-11-08 DIAGNOSIS — R072 Precordial pain: Secondary | ICD-10-CM | POA: Diagnosis not present

## 2021-11-08 DIAGNOSIS — I10 Essential (primary) hypertension: Secondary | ICD-10-CM | POA: Diagnosis not present

## 2021-11-08 DIAGNOSIS — M47814 Spondylosis without myelopathy or radiculopathy, thoracic region: Secondary | ICD-10-CM | POA: Diagnosis not present

## 2021-11-08 DIAGNOSIS — R0789 Other chest pain: Secondary | ICD-10-CM | POA: Diagnosis not present

## 2021-11-08 DIAGNOSIS — E782 Mixed hyperlipidemia: Secondary | ICD-10-CM | POA: Diagnosis present

## 2021-11-08 DIAGNOSIS — M549 Dorsalgia, unspecified: Secondary | ICD-10-CM | POA: Diagnosis not present

## 2021-11-08 LAB — CBC
HCT: 42.7 % (ref 39.0–52.0)
HCT: 42 % (ref 39.0–52.0)
Hemoglobin: 13.7 g/dL (ref 13.0–17.0)
Hemoglobin: 13.2 g/dL (ref 13.0–17.0)
MCH: 30.2 pg (ref 26.0–34.0)
MCH: 29.8 pg (ref 26.0–34.0)
MCHC: 32.1 g/dL (ref 30.0–36.0)
MCHC: 31.4 g/dL (ref 30.0–36.0)
MCV: 94.3 fL (ref 80.0–100.0)
MCV: 94.8 fL (ref 80.0–100.0)
Platelets: 342 10*3/uL (ref 150–400)
Platelets: 275 10*3/uL (ref 150–400)
RBC: 4.53 MIL/uL (ref 4.22–5.81)
RBC: 4.43 MIL/uL (ref 4.22–5.81)
RDW: 13.3 % (ref 11.5–15.5)
RDW: 13.2 % (ref 11.5–15.5)
WBC: 9.4 10*3/uL (ref 4.0–10.5)
WBC: 8.8 10*3/uL (ref 4.0–10.5)
nRBC: 0 % (ref 0.0–0.2)
nRBC: 0 % (ref 0.0–0.2)

## 2021-11-08 LAB — ECHOCARDIOGRAM COMPLETE
Area-P 1/2: 5.38 cm2
Calc EF: 45.4 %
Height: 69 in
S' Lateral: 4.4 cm
Single Plane A2C EF: 40.3 %
Single Plane A4C EF: 53.1 %
Weight: 2504.43 oz

## 2021-11-08 LAB — I-STAT CHEM 8, ED
BUN: 9 mg/dL (ref 6–20)
Calcium, Ion: 1.11 mmol/L — ABNORMAL LOW (ref 1.15–1.40)
Chloride: 102 mmol/L (ref 98–111)
Creatinine, Ser: 1.1 mg/dL (ref 0.61–1.24)
Glucose, Bld: 139 mg/dL — ABNORMAL HIGH (ref 70–99)
HCT: 43 % (ref 39.0–52.0)
Hemoglobin: 14.6 g/dL (ref 13.0–17.0)
Potassium: 3.8 mmol/L (ref 3.5–5.1)
Sodium: 140 mmol/L (ref 135–145)
TCO2: 26 mmol/L (ref 22–32)

## 2021-11-08 LAB — RESP PANEL BY RT-PCR (FLU A&B, COVID) ARPGX2
Influenza A by PCR: NEGATIVE
Influenza B by PCR: NEGATIVE
SARS Coronavirus 2 by RT PCR: NEGATIVE

## 2021-11-08 LAB — TROPONIN I (HIGH SENSITIVITY)
Troponin I (High Sensitivity): 13 ng/L (ref <18)
Troponin I (High Sensitivity): 15 ng/L (ref <18)
Troponin I (High Sensitivity): 15 ng/L (ref <18)

## 2021-11-08 LAB — COMPREHENSIVE METABOLIC PANEL
ALT: 8 U/L (ref 0–44)
AST: 12 U/L — ABNORMAL LOW (ref 15–41)
Albumin: 3.6 g/dL (ref 3.5–5.0)
Alkaline Phosphatase: 60 U/L (ref 38–126)
Anion gap: 10 (ref 5–15)
BUN: 8 mg/dL (ref 6–20)
CO2: 25 mmol/L (ref 22–32)
Calcium: 9.1 mg/dL (ref 8.9–10.3)
Chloride: 102 mmol/L (ref 98–111)
Creatinine, Ser: 1.19 mg/dL (ref 0.61–1.24)
GFR, Estimated: >60 mL/min (ref >60)
Glucose, Bld: 143 mg/dL — ABNORMAL HIGH (ref 70–99)
Potassium: 3.7 mmol/L (ref 3.5–5.1)
Sodium: 137 mmol/L (ref 135–145)
Total Bilirubin: 0.6 mg/dL (ref 0.3–1.2)
Total Protein: 7.2 g/dL (ref 6.5–8.1)

## 2021-11-08 LAB — BRAIN NATRIURETIC PEPTIDE: B Natriuretic Peptide: 47.7 pg/mL (ref 0.0–100.0)

## 2021-11-08 LAB — RAPID URINE DRUG SCREEN, HOSP PERFORMED
Amphetamines: NOT DETECTED
Barbiturates: NOT DETECTED
Benzodiazepines: NOT DETECTED
Cocaine: POSITIVE — AB
Opiates: NOT DETECTED
Tetrahydrocannabinol: NOT DETECTED

## 2021-11-08 LAB — HIV ANTIBODY (ROUTINE TESTING W REFLEX): HIV Screen 4th Generation wRfx: NONREACTIVE

## 2021-11-08 LAB — SEDIMENTATION RATE: Sed Rate: 35 mm/hr — ABNORMAL HIGH (ref 0–16)

## 2021-11-08 LAB — HEMOGLOBIN A1C
Hgb A1c MFr Bld: 4.3 % — ABNORMAL LOW (ref 4.8–5.6)
Mean Plasma Glucose: 76.71 mg/dL

## 2021-11-08 LAB — HEPARIN LEVEL (UNFRACTIONATED)
Heparin Unfractionated: <0.1 IU/mL — ABNORMAL LOW (ref 0.30–0.70)
Heparin Unfractionated: 0.12 IU/mL — ABNORMAL LOW (ref 0.30–0.70)

## 2021-11-08 LAB — CREATININE, SERUM
Creatinine, Ser: 0.85 mg/dL (ref 0.61–1.24)
GFR, Estimated: >60 mL/min (ref >60)

## 2021-11-08 LAB — ETHANOL: Alcohol, Ethyl (B): <10 mg/dL (ref <10)

## 2021-11-08 MED ORDER — OLOPATADINE HCL 0.1 % OP SOLN: 1.0000 [drp] | Freq: Two times a day (BID) | OPHTHALMIC | Status: DC

## 2021-11-08 MED ORDER — HEPARIN BOLUS VIA INFUSION
2000.0000 [IU] | Freq: Once | INTRAVENOUS | Status: AC
  Administered 2021-11-08: 2000 [IU] via INTRAVENOUS
  Filled 2021-11-08: qty 2000

## 2021-11-08 MED ORDER — HEPARIN BOLUS VIA INFUSION
4000.0000 [IU] | Freq: Once | INTRAVENOUS | Status: AC
  Administered 2021-11-08: 4000 [IU] via INTRAVENOUS
  Filled 2021-11-08: qty 4000

## 2021-11-08 MED ORDER — NITROGLYCERIN 0.4 MG SL SUBL: 0.4000 mg | SUBLINGUAL_TABLET | SUBLINGUAL | Status: DC | PRN

## 2021-11-08 MED ORDER — ICOSAPENT ETHYL 1 G PO CAPS
2.0000 g | ORAL_CAPSULE | Freq: Two times a day (BID) | ORAL | Status: DC
  Administered 2021-11-08 – 2021-11-09 (×2): 2 g via ORAL
  Filled 2021-11-08 (×3): qty 2

## 2021-11-08 MED ORDER — ONDANSETRON HCL 4 MG/2ML IJ SOLN: 4.0000 mg | Freq: Four times a day (QID) | INTRAMUSCULAR | Status: DC | PRN

## 2021-11-08 MED ORDER — TAMSULOSIN HCL 0.4 MG PO CAPS
0.4000 mg | ORAL_CAPSULE | Freq: Every day | ORAL | Status: DC
  Administered 2021-11-08: 0.4 mg via ORAL
  Filled 2021-11-08: qty 1

## 2021-11-08 MED ORDER — ACETAMINOPHEN 325 MG PO TABS: 650.0000 mg | ORAL_TABLET | ORAL | Status: DC | PRN

## 2021-11-08 MED ORDER — SODIUM CHLORIDE 0.9 % IV SOLN
1000.0000 mL | INTRAVENOUS | Status: DC
  Administered 2021-11-08 – 2021-11-09 (×4): 1000 mL via INTRAVENOUS

## 2021-11-08 MED ORDER — SODIUM CHLORIDE 0.9 % IV BOLUS
1000.0000 mL | Freq: Once | INTRAVENOUS | Status: AC
  Administered 2021-11-08: 1000 mL via INTRAVENOUS

## 2021-11-08 MED ORDER — HEPARIN SODIUM (PORCINE) 5000 UNIT/ML IJ SOLN: 5000.0000 [IU] | Freq: Three times a day (TID) | INTRAMUSCULAR | Status: DC

## 2021-11-08 MED ORDER — ASPIRIN EC 81 MG PO TBEC
81.0000 mg | DELAYED_RELEASE_TABLET | Freq: Every day | ORAL | Status: DC
  Administered 2021-11-09: 81 mg via ORAL
  Filled 2021-11-08: qty 1

## 2021-11-08 MED ORDER — HEPARIN (PORCINE) 25000 UT/250ML-% IV SOLN
1500.0000 [IU]/h | INTRAVENOUS | Status: DC
  Administered 2021-11-08: 900 [IU]/h via INTRAVENOUS
  Administered 2021-11-09: 1300 [IU]/h via INTRAVENOUS
  Filled 2021-11-08 (×2): qty 250

## 2021-11-08 MED ORDER — ASPIRIN EC 81 MG PO TBEC: 81.0000 mg | DELAYED_RELEASE_TABLET | Freq: Every day | ORAL | Status: DC

## 2021-11-08 MED ORDER — ASPIRIN 81 MG PO CHEW
324.0000 mg | CHEWABLE_TABLET | Freq: Once | ORAL | Status: AC
  Administered 2021-11-08: 324 mg via ORAL
  Filled 2021-11-08: qty 4

## 2021-11-08 MED ORDER — ATORVASTATIN CALCIUM 80 MG PO TABS
80.0000 mg | ORAL_TABLET | Freq: Every day | ORAL | Status: DC
  Administered 2021-11-08 – 2021-11-09 (×2): 80 mg via ORAL
  Filled 2021-11-08 (×2): qty 1

## 2021-11-08 MED ORDER — FENTANYL CITRATE PF 50 MCG/ML IJ SOSY
25.0000 ug | PREFILLED_SYRINGE | Freq: Once | INTRAMUSCULAR | Status: AC
  Administered 2021-11-08: 25 ug via INTRAVENOUS
  Filled 2021-11-08: qty 1

## 2021-11-08 MED ORDER — IOHEXOL 350 MG/ML SOLN
100.0000 mL | Freq: Once | INTRAVENOUS | Status: AC | PRN
  Administered 2021-11-08: 100 mL via INTRAVENOUS

## 2021-11-08 MED ORDER — SODIUM CHLORIDE 0.9 % IV BOLUS (SEPSIS)
1000.0000 mL | Freq: Once | INTRAVENOUS | Status: AC
  Administered 2021-11-08: 1000 mL via INTRAVENOUS

## 2021-11-08 NOTE — Progress Notes (Signed)
  Echocardiogram 2D Echocardiogram has been performed.  Kenneth Long 11/08/2021, 3:18 PM

## 2021-11-08 NOTE — ED Provider Notes (Signed)
7:22 AM Care assumed from Dr. Leonette Monarch.  At time of transfer care, patient is waiting for second troponin prior to speaking with cardiology again to determine if patient goes emergently to Cath Lab or just admission to cardiology team.  Patient reportedly has a history of STEMI and was having chest discomfort and hypotension and altered mental status overnight.  The CT dissection study did not show any evidence of dissection or pulmonary embolism and blood pressure has improved with some fluids.  Due to the concern for possible right heart injury, patient is holding on nitro but he reportedly took 1 prior to come to the emergency department.  Patient has been placed on heparin for the cardiology fellow recommendations and the soon as troponin has returned we will call.  8:12 AM Patient second troponin returned normal.  Patient was reassessed by me and is now having no chest pain but is still very diaphoretic.  Patient is still on the heparin.  Per previous plan, will call cardiology for admission to their service but as troponin on rise, do not suspect he will go to the Cath Lab immediately.     Romona Murdy, Gwenyth Allegra, MD 11/08/21 1140

## 2021-11-08 NOTE — Progress Notes (Signed)
ANTICOAGULATION CONSULT NOTE - Initial Consult  Pharmacy Consult for Heparin Indication: chest pain/ACS  Allergies  Allergen Reactions   Peanut-Containing Drug Products Anaphylaxis and Swelling   Shellfish Allergy Anaphylaxis and Swelling   Other     IV Lesiscan- tht iritation with a myocardial perf study 09-13-2018    Patient Measurements: Height: 5\' 9"  (175.3 cm) Weight: 71 kg (156 lb 8.4 oz) IBW/kg (Calculated) : 70.7  Vital Signs: BP: 121/79 (11/20 0700) Pulse Rate: 86 (11/20 0700)  Labs: Recent Labs    11/08/21 0418 11/08/21 0424  HGB 13.7 14.6  HCT 42.7 43.0  PLT 342  --   CREATININE 1.19 1.10  TROPONINIHS 13  --     Estimated Creatinine Clearance: 77.7 mL/min (by C-G formula based on SCr of 1.1 mg/dL).   Medical History: Past Medical History:  Diagnosis Date   Allergy    SEASONAL   Arthritis    Asthma    Colon adenomas 08/07/2019   Dr. Ardis Hughs 5 Tubular adenomas without high grade dysplasia.   Coronary artery disease    Dyspnea    "sometimes" with exertion    Headache    Heart murmur    Hyperlipidemia    Hypertension    Sleep apnea    no CPAP   ST elevation (STEMI) myocardial infarction involving left circumflex coronary artery ([place]) 03/16/2016   DES CFX   Umbilical hernia     Medications:  No current facility-administered medications on file prior to encounter.   Current Outpatient Medications on File Prior to Encounter  Medication Sig Dispense Refill   albuterol (VENTOLIN HFA) 108 (90 Base) MCG/ACT inhaler Inhale 2 puffs into the lungs every 6 (six) hours as needed for wheezing or shortness of breath. 18 g 0   amLODipine (NORVASC) 10 MG tablet Take 1 tablet (10 mg total) by mouth daily. 30 tablet 11   aspirin EC 81 MG tablet Take 1 tablet (81 mg total) by mouth daily. 90 tablet 3   atorvastatin (LIPITOR) 80 MG tablet Take 1 tablet (80 mg total) by mouth daily. 30 tablet 7   carvedilol (COREG) 25 MG tablet Take 1 tablet (25 mg total) by mouth  2 (two) times daily. 180 tablet 3   lisinopril-hydrochlorothiazide (ZESTORETIC) 20-25 MG tablet 1 tab by mouth daily 30 tablet 11   loratadine (CLARITIN) 10 MG tablet Take 1 tablet (10 mg total) by mouth daily. 30 tablet 11   mometasone (NASONEX) 50 MCG/ACT nasal spray 2 sprays each nostril daily 17 g 12   nitroGLYCERIN (NITROSTAT) 0.4 MG SL tablet Place 1 tablet (0.4 mg total) under the tongue every 5 (five) minutes as needed for chest pain. 25 tablet 12   olopatadine (PATADAY) 0.1 % ophthalmic solution Place 1 drop into both eyes 2 (two) times daily. 5 mL 12   tamsulosin (FLOMAX) 0.4 MG CAPS capsule 1 cap by mouth at bedtime. 30 capsule 11   VASCEPA 1 g capsule Take 2 capsules by mouth twice daily 120 capsule 10     Assessment: 55 y.o. male with chest pain for heparin  Goal of Therapy:  Heparin level 0.3-0.7 units/ml Monitor platelets by anticoagulation protocol: Yes   Plan:  Heparin 4000 units IV bolus, then start heparin 900 units/hr Check heparin level in 6 hours.   Caryl Pina 11/08/2021,7:08 AM

## 2021-11-08 NOTE — Progress Notes (Signed)
ANTICOAGULATION CONSULT NOTE  Pharmacy Consult for Heparin Indication: chest pain/ACS  Allergies  Allergen Reactions   Peanut-Containing Drug Products Anaphylaxis and Swelling   Shellfish Allergy Anaphylaxis and Swelling   Other     IV Lesiscan- tht iritation with a myocardial perf study 09-13-2018    Patient Measurements: Height: 5\' 9"  (175.3 cm) Weight: 70.8 kg (156 lb 1.4 oz) IBW/kg (Calculated) : 70.7 Heparin Dosing Weight: 71 kg  Vital Signs: Temp: 99 F (37.2 C) (11/20 1924) Temp Source: Oral (11/20 1924) BP: 138/87 (11/20 1924) Pulse Rate: 83 (11/20 1924)  Labs: Recent Labs    11/08/21 0418 11/08/21 0424 11/08/21 0616 11/08/21 1400 11/08/21 1816 11/08/21 2152  HGB 13.7 14.6  --   --  13.2  --   HCT 42.7 43.0  --   --  42.0  --   PLT 342  --   --   --  275  --   HEPARINUNFRC  --   --   --  <0.10*  --  0.12*  CREATININE 1.19 1.10  --   --  0.85  --   TROPONINIHS 13  --  15  --  15  --      Estimated Creatinine Clearance: 100.5 mL/min (by C-G formula based on SCr of 0.85 mg/dL).   Medical History: Past Medical History:  Diagnosis Date   Allergy    SEASONAL   Arthritis    Asthma    Colon adenomas 08/07/2019   Dr. Ardis Hughs 5 Tubular adenomas without high grade dysplasia.   Coronary artery disease    Dyspnea    "sometimes" with exertion    Headache    Heart murmur    Hyperlipidemia    Hypertension    Sleep apnea    no CPAP   ST elevation (STEMI) myocardial infarction involving left circumflex coronary artery ([place]) 03/16/2016   DES CFX   Umbilical hernia     Medications:  Medications Prior to Admission  Medication Sig Dispense Refill Last Dose   albuterol (VENTOLIN HFA) 108 (90 Base) MCG/ACT inhaler Inhale 2 puffs into the lungs every 6 (six) hours as needed for wheezing or shortness of breath. 18 g 0 Past Month   amLODipine (NORVASC) 10 MG tablet Take 1 tablet (10 mg total) by mouth daily. 30 tablet 11 11/07/2021   aspirin EC 81 MG tablet Take 1  tablet (81 mg total) by mouth daily. 90 tablet 3 11/07/2021   atorvastatin (LIPITOR) 80 MG tablet Take 1 tablet (80 mg total) by mouth daily. 30 tablet 7 11/07/2021   carvedilol (COREG) 25 MG tablet Take 25 mg by mouth 2 (two) times daily with a meal.   11/06/2021   lisinopril-hydrochlorothiazide (ZESTORETIC) 20-25 MG tablet 1 tab by mouth daily (Patient taking differently: Take 1 tablet by mouth daily.) 30 tablet 11 11/07/2021   loratadine (CLARITIN) 10 MG tablet Take 1 tablet (10 mg total) by mouth daily. 30 tablet 11 11/07/2021   nitroGLYCERIN (NITROSTAT) 0.4 MG SL tablet Place 1 tablet (0.4 mg total) under the tongue every 5 (five) minutes as needed for chest pain. 25 tablet 12 11/07/2021   tamsulosin (FLOMAX) 0.4 MG CAPS capsule 1 cap by mouth at bedtime. (Patient taking differently: 0.4 mg at bedtime.) 30 capsule 11 11/07/2021   VASCEPA 1 g capsule Take 2 capsules by mouth twice daily 120 capsule 10 11/07/2021   carvedilol (COREG) 25 MG tablet Take 1 tablet (25 mg total) by mouth 2 (two) times daily. 180 tablet 3  mometasone (NASONEX) 50 MCG/ACT nasal spray 2 sprays each nostril daily (Patient not taking: Reported on 11/08/2021) 17 g 12 Completed Course   olopatadine (PATADAY) 0.1 % ophthalmic solution Place 1 drop into both eyes 2 (two) times daily. (Patient not taking: Reported on 11/08/2021) 5 mL 12 Completed Course    Scheduled:   [START ON 11/09/2021] aspirin EC  81 mg Oral Daily   atorvastatin  80 mg Oral Daily   icosapent Ethyl  2 g Oral BID   tamsulosin  0.4 mg Oral QHS   Infusions:   sodium chloride 1,000 mL (11/08/21 1912)   heparin 1,100 Units/hr (11/08/21 1624)   PRN:   Assessment: 83 yom with a history of CAD s/p DES to OM, hypertension, hyperlipidemia, and polysubstance abuse . Patient is presenting with chest pain. Heparin per pharmacy consult placed for chest pain/ACS.  Patient is on not on anticoagulation prior to arrival.  Heparin level came back subtherapeutic  at 0.12, on 1100 units/hr. No s/sx of bleeding or infusion issues. Hgb 13.2, plt 275.   Goal of Therapy:  Heparin level 0.3-0.7 units/ml Monitor platelets by anticoagulation protocol: Yes   Plan:  Give 2000 units bolus x 1 Increase heparin infusion to 1300 units/hr Check anti-Xa level in 6 hours and daily while on heparin Continue to monitor H&H and platelets  Antonietta Jewel, PharmD, [place] Pharmacist  Phone: [phone] 11/08/2021 10:23 PM  Please check AMION for all [place] phone numbers After 10:00 PM, call [place] [phone]

## 2021-11-08 NOTE — ED Triage Notes (Signed)
Pt arrives POV for eval of chest pain and nausea. Pt reports that pain started 1 hour PTA, took 1 SL ntg for pain, reports not relief. Pt arrives pale and diaphoretic. Lethargic, requiring sternal rub for response initially. MD Cardama to bedside in triage to assess.

## 2021-11-08 NOTE — Progress Notes (Signed)
ANTICOAGULATION CONSULT NOTE - Initial Consult  Pharmacy Consult for Heparin Indication: chest pain/ACS  Allergies  Allergen Reactions   Peanut-Containing Drug Products Anaphylaxis and Swelling   Shellfish Allergy Anaphylaxis and Swelling   Other     IV Lesiscan- tht iritation with a myocardial perf study 09-13-2018    Patient Measurements: Height: 5\' 9"  (175.3 cm) Weight: 71 kg (156 lb 8.4 oz) IBW/kg (Calculated) : 70.7 Heparin Dosing Weight: 71 kg  Vital Signs: Temp: 98.2 F (36.8 C) (11/20 1300) Temp Source: Oral (11/20 1300) BP: 122/83 (11/20 1300) Pulse Rate: 75 (11/20 1300)  Labs: Recent Labs    11/08/21 0418 11/08/21 0424 11/08/21 0616 11/08/21 1400  HGB 13.7 14.6  --   --   HCT 42.7 43.0  --   --   PLT 342  --   --   --   HEPARINUNFRC  --   --   --  <0.10*  CREATININE 1.19 1.10  --   --   TROPONINIHS 13  --  15  --     Estimated Creatinine Clearance: 77.7 mL/min (by C-G formula based on SCr of 1.1 mg/dL).   Medical History: Past Medical History:  Diagnosis Date   Allergy    SEASONAL   Arthritis    Asthma    Colon adenomas 08/07/2019   Dr. Ardis Hughs 5 Tubular adenomas without high grade dysplasia.   Coronary artery disease    Dyspnea    "sometimes" with exertion    Headache    Heart murmur    Hyperlipidemia    Hypertension    Sleep apnea    no CPAP   ST elevation (STEMI) myocardial infarction involving left circumflex coronary artery ([place]) 03/16/2016   DES CFX   Umbilical hernia     Medications:  (Not in a hospital admission)  Scheduled:  Infusions:   sodium chloride 1,000 mL (11/08/21 1102)   heparin 900 Units/hr (11/08/21 0827)   PRN:   Assessment: 50 yom with a history of CAD s/p DES to OM, hypertension, hyperlipidemia, and polysubstance abuse . Patient is presenting with chest pain. Heparin per pharmacy consult placed for chest pain/ACS.  Patient is on not on anticoagulation prior to arrival.  Hgb 13.7; plt 342  Goal of  Therapy:  Heparin level 0.3-0.7 units/ml Monitor platelets by anticoagulation protocol: Yes   Plan:  Give 2000 units bolus x 1 increase heparin infusion to 1100 units/hr Check anti-Xa level in 6-8 hours and daily while on heparin Continue to monitor H&H and platelets  Lorelei Pont, PharmD, BCPS 11/08/2021 3:30 PM ED Clinical Pharmacist -  [phone]

## 2021-11-08 NOTE — ED Provider Notes (Signed)
[facility] EMERGENCY DEPARTMENT Provider Note  CSN: [id number] Arrival date & time: 11/08/21 0359  Chief Complaint(s) Chest Pain  HPI Kenneth Long is a 55 y.o. male    Chest Pain Pain location:  Substernal area Pain quality: aching   Pain severity:  Severe Onset quality:  Sudden Duration:  1 hour Timing:  Constant Progression:  Unchanged Chronicity:  New Relieved by:  Nothing Worsened by:  Exertion Associated symptoms: diaphoresis, dizziness, nausea, near-syncope and shortness of breath   Associated symptoms: no vomiting   Associated symptoms comment:  Left leg swelling and pain  Past Medical History Past Medical History:  Diagnosis Date   Allergy    SEASONAL   Arthritis    Asthma    Colon adenomas 08/07/2019   Dr. Ardis Hughs 5 Tubular adenomas without high grade dysplasia.   Coronary artery disease    Dyspnea    "sometimes" with exertion    Headache    Heart murmur    Hyperlipidemia    Hypertension    Sleep apnea    no CPAP   ST elevation (STEMI) myocardial infarction involving left circumflex coronary artery ([place]) 03/16/2016   DES CFX   Umbilical hernia    Patient Active Problem List   Diagnosis Date Noted   Cardiac arrhythmia 09/24/2020   Colon adenomas 08/07/2019   Stress 07/16/2018   History of hip replacement 08/24/2017   Primary osteoarthritis of right hip 07/11/2017   Coronary artery disease 04/25/2017   Seasonal allergies 04/25/2017   Mixed hyperlipidemia 03/25/2016   Cardiomyopathy, ischemic 03/25/2016   ST elevation (STEMI) myocardial infarction involving left circumflex coronary artery ([place]) 03/16/2016   Essential hypertension 03/09/2016   Decreased visual acuity 03/09/2016   Dental decay 03/09/2016   Home Medication(s) Prior to Admission medications   Medication Sig Start Date End Date Taking? Authorizing Provider  albuterol (VENTOLIN HFA) 108 (90 Base) MCG/ACT inhaler Inhale 2 puffs into the lungs every 6 (six)  hours as needed for wheezing or shortness of breath. 01/30/20   Mack Hook, MD  amLODipine (NORVASC) 10 MG tablet Take 1 tablet (10 mg total) by mouth daily. 05/12/20   Mack Hook, MD  aspirin EC 81 MG tablet Take 1 tablet (81 mg total) by mouth daily. 12/26/17   Martinique, Peter M, MD  atorvastatin (LIPITOR) 80 MG tablet Take 1 tablet (80 mg total) by mouth daily. 05/12/20   Mack Hook, MD  carvedilol (COREG) 25 MG tablet Take 1 tablet (25 mg total) by mouth 2 (two) times daily. 05/12/20 05/07/21  Mack Hook, MD  lisinopril-hydrochlorothiazide (ZESTORETIC) 20-25 MG tablet 1 tab by mouth daily 05/12/20   Mack Hook, MD  loratadine (CLARITIN) 10 MG tablet Take 1 tablet (10 mg total) by mouth daily. 09/22/20   Mack Hook, MD  mometasone (NASONEX) 50 MCG/ACT nasal spray 2 sprays each nostril daily 05/12/20   Mack Hook, MD  nitroGLYCERIN (NITROSTAT) 0.4 MG SL tablet Place 1 tablet (0.4 mg total) under the tongue every 5 (five) minutes as needed for chest pain. 02/06/19   Mack Hook, MD  olopatadine (PATADAY) 0.1 % ophthalmic solution Place 1 drop into both eyes 2 (two) times daily. 09/22/20   Mack Hook, MD  tamsulosin (FLOMAX) 0.4 MG CAPS capsule 1 cap by mouth at bedtime. 05/12/20   Mack Hook, MD  VASCEPA 1 g capsule Take 2 capsules by mouth twice daily 10/30/20   Mack Hook, MD  Past Surgical History Past Surgical History:  Procedure Laterality Date   CARDIAC CATHETERIZATION N/A 03/16/2016   Procedure: Left Heart Cath and Coronary Angiography;  Surgeon: Peter M Martinique, MD;  LAD OK, CFX 100%, OM1 20%, RCA 20%, EF nl   CARDIAC CATHETERIZATION N/A 03/16/2016   Procedure: Coronary Stent Intervention;  Surgeon: Peter M Martinique, MD; PROMUS PREM MR 3.0X16 mm DES    TOTAL HIP ARTHROPLASTY Right  08/24/2017   Procedure: RIGHT TOTAL HIP ARTHROPLASTY ANTERIOR APPROACH;  Surgeon: Leandrew Koyanagi, MD;  Location: [facility];  Service: Orthopedics;  Laterality: Right;  RIGHT TOTAL HIP ARTHROPLASTY ANTERIOR APPROACH   UMBILICAL HERNIA REPAIR  [id number]   umbilical hernia repair   Family History Family History  Problem Relation Age of Onset   Hypertension Mother    Sarcoidosis Mother    Hypertension Father    Hypertension Sister    Hypertension Sister    Colon cancer Neg Hx    Colon polyps Neg Hx    Esophageal cancer Neg Hx    Stomach cancer Neg Hx    Rectal cancer Neg Hx     Social History Social History   Tobacco Use   Smoking status: Former    Types: Cigarettes    Quit date: 03/18/2016    Years since quitting: 5.6   Smokeless tobacco: Never  Vaping Use   Vaping Use: Never used  Substance Use Topics   Alcohol use: Yes    Alcohol/week: 0.0 standard drinks    Comment: 2 beers every few days   Drug use: No   Allergies Peanut-containing drug products, Shellfish allergy, and Other  Review of Systems Review of Systems  Constitutional:  Positive for diaphoresis.  Respiratory:  Positive for shortness of breath.   Cardiovascular:  Positive for chest pain and near-syncope.  Gastrointestinal:  Positive for nausea. Negative for vomiting.  Neurological:  Positive for dizziness.  All other systems are reviewed and are negative for acute change except as noted in the HPI  Physical Exam Vital Signs  I have reviewed the triage vital signs BP 121/79   Pulse 86   Resp 14   Ht 5\' 9"  (1.753 m)   Wt 71 kg   SpO2 93%   BMI 23.11 kg/m   Physical Exam Vitals reviewed.  Constitutional:      General: He is not in acute distress.    Appearance: He is well-developed. He is ill-appearing. He is not diaphoretic.  HENT:     Head: Normocephalic and atraumatic.     Nose: Nose normal.  Eyes:     General: No scleral icterus.       Right eye: No discharge.        Left eye: No discharge.      Conjunctiva/sclera: Conjunctivae normal.     Pupils: Pupils are equal, round, and reactive to light.  Cardiovascular:     Rate and Rhythm: Normal rate and regular rhythm.     Heart sounds: No murmur heard.   No friction rub. No gallop.  Pulmonary:     Effort: Pulmonary effort is normal. No respiratory distress.     Breath sounds: Normal breath sounds. No stridor. No rales.  Abdominal:     General: There is no distension.     Palpations: Abdomen is soft.     Tenderness: There is no abdominal tenderness.  Musculoskeletal:     Cervical back: Normal range of motion and neck supple.     Left lower leg: Swelling and  tenderness present. 1+ Edema present.  Skin:    General: Skin is warm and dry.     Findings: No erythema or rash.  Neurological:     Mental Status: He is oriented to person, place, and time. He is lethargic.    ED Results and Treatments Labs (all labs ordered are listed, but only abnormal results are displayed) Labs Reviewed  COMPREHENSIVE METABOLIC PANEL - Abnormal; Notable for the following components:      Result Value   Glucose, Bld 143 (*)    AST 12 (*)    All other components within normal limits  I-STAT CHEM 8, ED - Abnormal; Notable for the following components:   Glucose, Bld 139 (*)    Calcium, Ion 1.11 (*)    All other components within normal limits  RESP PANEL BY RT-PCR (FLU A&B, COVID) ARPGX2  CBC  BRAIN NATRIURETIC PEPTIDE  RAPID URINE DRUG SCREEN, HOSP PERFORMED  ETHANOL  HEPARIN LEVEL (UNFRACTIONATED)  TROPONIN I (HIGH SENSITIVITY)  TROPONIN I (HIGH SENSITIVITY)                                                                                                                         EKG  EKG Interpretation  Date/Time:  Sunday November 08 2021 04:59:15 EST Ventricular Rate:  84 PR Interval:  153 QRS Duration: 106 QT Interval:  382 QTC Calculation: 452 R Axis:   -52 Text Interpretation: Sinus rhythm Probable left atrial enlargement RSR' in V1  or V2, right VCD or RVH Inferior infarct, age indeterminate Borderline ST elevation, anterior leads Lateral leads are also involved Confirmed by Addison Lank [phone]) on 11/08/2021 6:37:57 AM       Radiology DG Chest Port 1 View  Result Date: 11/08/2021 CLINICAL DATA:  Chest pains and nausea. EXAM: PORTABLE CHEST 1 VIEW COMPARISON:  Portable chest 03/16/2016. FINDINGS: There are multiple overlying monitor wires. The heart is enlarged as before. No vascular congestion is seen. No pleural effusion is evident. The lungs clear of infiltrates. Moderate thoracic spondylosis. IMPRESSION: No evidence of acute chest disease.  Cardiomegaly. Electronically Signed   By: Telford Nab M.D.   On: 11/08/2021 04:33   CT Angio Chest/Abd/Pel for Dissection W and/or Wo Contrast  Result Date: 11/08/2021 CLINICAL DATA:  Chest and back pain. EXAM: CT ANGIOGRAPHY CHEST, ABDOMEN AND PELVIS TECHNIQUE: Non-contrast CT of the chest was initially obtained. Multidetector CT imaging through the chest, abdomen and pelvis was performed using the standard protocol during bolus administration of intravenous contrast. Multiplanar reconstructed images and MIPs were obtained and reviewed to evaluate the vascular anatomy. CONTRAST:  148mL OMNIPAQUE IOHEXOL 350 MG/ML SOLN COMPARISON:  None. FINDINGS: CTA CHEST FINDINGS Cardiovascular: The heart size is normal. No substantial pericardial effusion. Coronary artery calcification is evident. No thoracic aortic aneurysm. Pre contrast imaging of the chest shows no hyperdense crescent in the wall of the thoracic aorta to suggest acute intramural hematoma. Postcontrast imaging shows no dissection flap in the thoracic aorta. Ascending  thoracic aorta measures 3.0 cm diameter. No large central embolus in the pulmonary outflow tract, main pulmonary arteries, or lobar pulmonary arteries. Segmental and subsegmental pulmonary arteries are not reliably evaluated due to bolus timing. Mediastinum/Nodes: No  mediastinal lymphadenopathy. 9 mm short axis right paratracheal lymph node is upper normal for size. There is no hilar lymphadenopathy. The esophagus has normal imaging features. There is no axillary lymphadenopathy. Lungs/Pleura: Centrilobular and paraseptal emphysema evident. No suspicious pulmonary nodule or mass. No focal airspace consolidation. No pleural effusion. Musculoskeletal: No worrisome lytic or sclerotic osseous abnormality. Review of the MIP images confirms the above findings. CTA ABDOMEN AND PELVIS FINDINGS VASCULAR Aorta: Normal caliber aorta without aneurysm, dissection, vasculitis or significant stenosis. Celiac: Patent without evidence of aneurysm, dissection, vasculitis or significant stenosis. Replaced right hepatic artery. SMA: Patent without evidence of aneurysm, dissection, vasculitis or significant stenosis. Replaced right hepatic artery. Renals: Both renal arteries are patent without evidence of aneurysm, dissection, vasculitis, fibromuscular dysplasia or significant stenosis. Accessory right renal artery evident arising 7 cm distal to the main renal artery. IMA: Patent without evidence of aneurysm, dissection, vasculitis or significant stenosis. Inflow: Patent without evidence of aneurysm, dissection, vasculitis or significant stenosis. Veins: No obvious venous abnormality within the limitations of this arterial phase study. Review of the MIP images confirms the above findings. NON-VASCULAR Hepatobiliary: No suspicious focal abnormality within the liver parenchyma. There is no evidence for gallstones, gallbladder wall thickening, or pericholecystic fluid. No intrahepatic or extrahepatic biliary dilation. Pancreas: No focal mass lesion. No dilatation of the main duct. No intraparenchymal cyst. No peripancreatic edema. Spleen: No splenomegaly. No focal mass lesion. Adrenals/Urinary Tract: No adrenal nodule or mass. Nodular thickening noted left adrenal gland. Kidneys unremarkable. No  evidence for hydroureter. Bladder is obscured by beam hardening artifact from right hip replacement. Stomach/Bowel: Stomach is unremarkable. No gastric wall thickening. No evidence of outlet obstruction. Duodenum is normally positioned as is the ligament of Treitz. No small bowel wall thickening. No small bowel dilatation. The terminal ileum is normal. The appendix is normal. No gross colonic mass. No colonic wall thickening. Rectum obscured by beam hardening artifact. Lymphatic: There is no gastrohepatic or hepatoduodenal ligament lymphadenopathy. No retroperitoneal or mesenteric lymphadenopathy. No pelvic sidewall lymphadenopathy. Reproductive: Prostate gland is obscured. Other: No intraperitoneal free fluid. Musculoskeletal: Right total hip replacement. No worrisome lytic or sclerotic osseous abnormality. Review of the MIP images confirms the above findings. IMPRESSION: 1. No evidence for thoracoabdominal aortic aneurysm or dissection. 2. No evidence for large central pulmonary embolus back 3. No acute findings in the chest, abdomen, or pelvis. Specifically, no findings to explain the patient's history of chest and back pain. 4. Emphysema (ICD10-J43.9). Electronically Signed   By: Misty Stanley M.D.   On: 11/08/2021 05:59    Pertinent labs & imaging results that were available during my care of the patient were reviewed by me and considered in my medical decision making (see MDM for details).  Medications Ordered in ED Medications  sodium chloride 0.9 % bolus 1,000 mL (1,000 mLs Intravenous New Bag/Given 11/08/21 0441)    Followed by  0.9 %  sodium chloride infusion (1,000 mLs Intravenous New Bag/Given 11/08/21 0441)  sodium chloride 0.9 % bolus 1,000 mL (has no administration in time range)  heparin bolus via infusion 4,000 Units (has no administration in time range)  heparin ADULT infusion 100 units/mL (25000 units/231mL) (has no administration in time range)  iohexol (OMNIPAQUE) 350 MG/ML injection  100 mL (100 mLs Intravenous Contrast Given  11/08/21 0541)  aspirin chewable tablet 324 mg (324 mg Oral Given 11/08/21 0648)  fentaNYL (SUBLIMAZE) injection 25 mcg (25 mcg Intravenous Given 11/08/21 0647)                                                                                                                                     Procedures .1-3 Lead EKG Interpretation Performed by: Fatima Blank, MD Authorized by: Fatima Blank, MD     Interpretation: normal     ECG rate:  79   ECG rate assessment: normal     Rhythm: sinus rhythm     Ectopy: none     Conduction: normal   .Critical Care Performed by: Fatima Blank, MD Authorized by: Fatima Blank, MD   Critical care provider statement:    Critical care time (minutes):  60   Critical care time was exclusive of:  Separately billable procedures and treating other patients   Critical care was necessary to treat or prevent imminent or life-threatening deterioration of the following conditions:  Cardiac failure and circulatory failure   Critical care was time spent personally by me on the following activities:  Development of treatment plan with patient or surrogate, discussions with consultants, evaluation of patient's response to treatment, examination of patient, obtaining history from patient or surrogate, review of old charts, re-evaluation of patient's condition, pulse oximetry, ordering and review of radiographic studies, ordering and review of laboratory studies and ordering and performing treatments and interventions   Care discussed with: admitting provider    (including critical care time)  Medical Decision Making / ED Course I have reviewed the nursing notes for this encounter and the patient's prior records (if available in EHR or on provided paperwork).  Clearance Kenneth Long was evaluated in Emergency Department on 11/08/2021 for the symptoms described in the history of present illness.  He was evaluated in the context of the global COVID-19 pandemic, which necessitated consideration that the patient might be at risk for infection with the SARS-CoV-2 virus that causes COVID-19. Institutional protocols and algorithms that pertain to the evaluation of patients at risk for COVID-19 are in a state of rapid change based on information released by regulatory bodies including the CDC and federal and state organizations. These policies and algorithms were followed during the patient's care in the ED.     Chest pain Associated with near syncope, shortness of breath, diaphoresis, unilateral leg swelling. Patient is hypotensive.  Not hypoxic. 2 large-bore IVs inserted patient started on IV fluids Labs obtained EKG with borderline ST segment elevation in the inferior leads. No clear clinical picture at this time but concerning for PE, dissection, and ACS of right-sided heart. I called Dr. Saunders Revel who is the on-call STEMI cardiologist and had him review the EKG. he does not believe this meets STEMI criteria at this moment.  Agrees with pursuing PE vs dissection.  Requested a repeat EKG  to see dynamic changes. Repeat EKG shows improved inferior ST changes. Blood pressures improving with IVF. Holding ASA and NTG for now. 6:52 AM: CTA negative for dissection or PE. Patient still having CP. BP better. Giving ASA, fentanyl. Spoke with Dr. Saunders Revel - start Hep gtt, get second trop. If significantly elevated, patient will go to Cath. If not elevated, patient can be admitted to cardiology for ACS.   Pertinent labs & imaging results that were available during my care of the patient were reviewed by me and considered in my medical decision making:    Final Clinical Impression(s) / ED Diagnoses Final diagnoses:  Chest pain  Hypotension, unspecified hypotension type     This chart was dictated using voice recognition software.  Despite best efforts to proofread,  errors can occur which can change the  documentation meaning.    Fatima Blank, MD 11/08/21 [phone]

## 2021-11-08 NOTE — H&P (Signed)
Cardiology Admission History and Physical:   Patient ID: Kenneth Long MRN: [id number]; DOB: [birth year]   Admission date: 11/08/2021  PCP:  Mack Hook, MD   [facility] Providers Cardiologist:  Peter Martinique, MD   {  Chief Complaint:  Chest pain   Patient Profile:   Kenneth Long is a 55 y.o. male with history of CAD s/p DES to OM, hypertension, hyperlipidemia, and polysubstance abuse  who is being seen 11/08/2021 for the evaluation of chest pain at request of Dr. Sherry Ruffing.  History of inferior STEMI March 2017.  Cardiac cath showed obstructive second OM s/p DES placement.  Otherwise no obstructive disease.  Echo showed LV function of 40 to 45% without wall motion abnormality.  He underwent right total hip replacement 01/2335 without complication.  Myoview September 2019 showing scar without evidence of ischemia.  EF stable at 45%.  He was doing relatively well and when last seen by Dr. Martinique March 2021.  History of Present Illness:   Kenneth Long was at his friend's house last night.  He felt fever and chills without checking his temperature.  Reports he was drinking beer and smoking tobacco and marijuana.  He declined using cocaine.  Says his friend was smoking cocaine.  However, his urine drug screen positive for cocaine.  After using this, he had sudden onset chest pain and felt diaphoretic and lethargic.  Took sublingual nitroglycerin without improvement.  His friend brought him to ER.  Upon arrival, he was lethargic, pale and diaphoretic.  He required sternal rub for response.  Patient was hypotensive and given fluid bolus.  EKG concerning for inferior STEMI.  Case discussed with Dr. Saunders Revel, he did not meet STEMI criteria.  CT angio negative for dissection or pulmonary embolism.  He was started on heparin drip after discussion with cardiologist.  Troponin has remained negative.  Alcohol level negative.  Respiratory panel negative for influenza and  COVID.  Patient denies any chest pain or shortness of breath.  Denies palpitation or melena.  He reports compliance with his medication.  No regular exercise due to arthritis.  Reports 2 days history of left lower extremity swelling which has improved. No orthopnea or PND.   Past Medical History:  Diagnosis Date   Allergy    SEASONAL   Arthritis    Asthma    Colon adenomas 08/07/2019   Dr. Ardis Hughs 5 Tubular adenomas without high grade dysplasia.   Coronary artery disease    Dyspnea    "sometimes" with exertion    Headache    Heart murmur    Hyperlipidemia    Hypertension    Sleep apnea    no CPAP   ST elevation (STEMI) myocardial infarction involving left circumflex coronary artery ([place]) 03/16/2016   DES CFX   Umbilical hernia     Past Surgical History:  Procedure Laterality Date   CARDIAC CATHETERIZATION N/A 03/16/2016   Procedure: Left Heart Cath and Coronary Angiography;  Surgeon: Peter M Martinique, MD;  LAD OK, CFX 100%, OM1 20%, RCA 20%, EF nl   CARDIAC CATHETERIZATION N/A 03/16/2016   Procedure: Coronary Stent Intervention;  Surgeon: Peter M Martinique, MD; PROMUS PREM MR 3.0X16 mm DES    TOTAL HIP ARTHROPLASTY Right 08/24/2017   Procedure: RIGHT TOTAL HIP ARTHROPLASTY ANTERIOR APPROACH;  Surgeon: Leandrew Koyanagi, MD;  Location: [place];  Service: Orthopedics;  Laterality: Right;  RIGHT TOTAL HIP ARTHROPLASTY ANTERIOR APPROACH   UMBILICAL HERNIA REPAIR  1224   umbilical hernia repair  Medications Prior to Admission: Prior to Admission medications   Medication Sig Start Date End Date Taking? Authorizing Provider  albuterol (VENTOLIN HFA) 108 (90 Base) MCG/ACT inhaler Inhale 2 puffs into the lungs every 6 (six) hours as needed for wheezing or shortness of breath. 01/30/20   Mack Hook, MD  amLODipine (NORVASC) 10 MG tablet Take 1 tablet (10 mg total) by mouth daily. 05/12/20   Mack Hook, MD  aspirin EC 81 MG tablet Take 1 tablet (81 mg total) by mouth daily. 12/26/17    Martinique, Peter M, MD  atorvastatin (LIPITOR) 80 MG tablet Take 1 tablet (80 mg total) by mouth daily. 05/12/20   Mack Hook, MD  carvedilol (COREG) 25 MG tablet Take 1 tablet (25 mg total) by mouth 2 (two) times daily. 05/12/20 05/07/21  Mack Hook, MD  lisinopril-hydrochlorothiazide (ZESTORETIC) 20-25 MG tablet 1 tab by mouth daily 05/12/20   Mack Hook, MD  loratadine (CLARITIN) 10 MG tablet Take 1 tablet (10 mg total) by mouth daily. 09/22/20   Mack Hook, MD  mometasone (NASONEX) 50 MCG/ACT nasal spray 2 sprays each nostril daily 05/12/20   Mack Hook, MD  nitroGLYCERIN (NITROSTAT) 0.4 MG SL tablet Place 1 tablet (0.4 mg total) under the tongue every 5 (five) minutes as needed for chest pain. 02/06/19   Mack Hook, MD  olopatadine (PATADAY) 0.1 % ophthalmic solution Place 1 drop into both eyes 2 (two) times daily. 09/22/20   Mack Hook, MD  tamsulosin (FLOMAX) 0.4 MG CAPS capsule 1 cap by mouth at bedtime. 05/12/20   Mack Hook, MD  VASCEPA 1 g capsule Take 2 capsules by mouth twice daily 10/30/20   Mack Hook, MD     Allergies:    Allergies  Allergen Reactions   Peanut-Containing Drug Products Anaphylaxis and Swelling   Shellfish Allergy Anaphylaxis and Swelling   Other     IV Lesiscan- tht iritation with a myocardial perf study 09-13-2018    Social History:   Social History   Socioeconomic History   Marital status: Single    Spouse name: Not on file   Number of children: 2   Years of education: Not on file   Highest education level: Not on file  Occupational History   Occupation: Previously supervised Solicitor at [employer] Use   Smoking status: Former    Types: Cigarettes    Quit date: 03/18/2016    Years since quitting: 5.6   Smokeless tobacco: Never  Vaping Use   Vaping Use: Never used  Substance and Sexual Activity   Alcohol use: Yes    Alcohol/week: 0.0 standard drinks     Comment: 2 beers every few days   Drug use: No   Sexual activity: Yes    Comment: since 2019, problems with decreased erection.  Other Topics Concern   Not on file  Social History Narrative   Lives by himself   Unemployed due to hip problems   Born and raised in [place], [place] in [place] since early 1980s.   Social Determinants of Health   Financial Resource Strain: Not on file  Food Insecurity: Not on file  Transportation Needs: Not on file  Physical Activity: Not on file  Stress: Not on file  Social Connections: Not on file  Intimate Partner Violence: Not on file    Family History:   The patient's family history includes Hypertension in his father, mother, sister, and sister; Sarcoidosis in his mother. There is no history  of Colon cancer, Colon polyps, Esophageal cancer, Stomach cancer, or Rectal cancer.    ROS:  Please see the history of present illness.  All other ROS reviewed and negative.     Physical Exam/Data:   Vitals:   11/08/21 1100 11/08/21 1130 11/08/21 1145 11/08/21 1300  BP: 119/86 (!) 119/92 (!) 132/98 122/83  Pulse: 88 72 81 75  Resp: _0 Temp:    98.2 F (36.8 C)  TempSrc:    Oral  SpO2: 94% 97% 95% 95%  Weight:      Height:        Intake/Output Summary (Last 24 hours) at 11/08/2021 1541 Last data filed at 11/08/2021 1030 Gross per 24 hour  Intake 1000.51 ml  Output --  Net 1000.51 ml    Last 3 Weights 11/08/2021 09/22/2020 02/18/2020  Weight (lbs) 156 lb 8.4 oz 156 lb 164 lb  Weight (kg) 71 kg 70.761 kg 74.39 kg     Body mass index is 23.11 kg/m.  General:  Well nourished, well developed, in no acute distress HEENT: normal Neck: no JVD Vascular: No carotid bruits; Distal pulses 2+ bilaterally   Cardiac:  normal S1, S2; RRR; no murmur  Lungs:  clear to auscultation bilaterally, no wheezing, rhonchi or rales  Abd: soft, nontender, no hepatomegaly  Ext:1 + LLE edema Musculoskeletal:  No deformities, BUE and  BLE strength normal and equal Skin: warm and dry  Neuro:  CNs 2-12 intact, no focal abnormalities noted Psych:  Normal affect    EKG:  The ECG that was done today was personally reviewed and demonstrates Sr, TWI inferior lateral leads   Relevant CV Studies:  Stress test 08/2018 Nuclear stress EF: 45%. The left ventricular ejection fraction is mildly decreased (45-54%). There was no ST segment deviation noted during stress. No T wave inversion was noted during stress. Defect 1: There is a small defect of severe severity present in the basal inferolateral and mid inferolateral location. Findings consistent with prior myocardial infarction. This is an intermediate risk study due to reduced systolic function. There is no ischemia.  Echo 02/2016 - Left ventricle: Posterior and lateral wall hypokinesis The cavity    size was moderately dilated. Systolic function was mildly to    moderately reduced. The estimated ejection fraction was in the    range of 40% to 45%. Wall motion was normal; there were no    regional wall motion abnormalities.  - Mitral valve: There was mild regurgitation.  - Left atrium: The atrium was mildly dilated.  - Atrial septum: No defect or patent foramen ovale was identified.  Left Heart Cath and Coronary Angiography 02/2016   Conclusion  Prox RCA to Mid RCA lesion, 20% stenosed. 1st Mrg lesion, 20% stenosed. The left ventricular systolic function is normal. 2nd Mrg lesion, 100% stenosed. Post intervention, there is a 0% residual stenosis.   1. Single vessel occlusive CAD 2. Low normal LV function 3. Successful stenting of the second OM with a DES   Plan: DAPT for one year. Patient may be a candidate for fast track discharge if no complications.   Laboratory Data:  High Sensitivity Troponin:   Recent Labs  Lab 11/08/21 0418 11/08/21 0616  TROPONINIHS 13 15       Chemistry Recent Labs  Lab 11/08/21 0418 11/08/21 0424  NA 137 140  K 3.7 3.8  CL  102 102  CO2 25  --   GLUCOSE 143* 139*  BUN 8 9  CREATININE 1.19 1.10  CALCIUM 9.1  --   GFRNONAA >60  --   ANIONGAP 10  --      Recent Labs  Lab 11/08/21 0418  PROT 7.2  ALBUMIN 3.6  AST 12*  ALT 8  ALKPHOS 60  BILITOT 0.6    Hematology Recent Labs  Lab 11/08/21 0418 11/08/21 0424  WBC 9.4  --   RBC 4.53  --   HGB 13.7 14.6  HCT 42.7 43.0  MCV 94.3  --   MCH 30.2  --   MCHC 32.1  --   RDW 13.3  --   PLT 342  --     Thyroid No results for input(s): TSH, FREET4 in the last 168 hours. BNP Recent Labs  Lab 11/08/21 0419  BNP 47.7     DDimer No results for input(s): DDIMER in the last 168 hours.   Radiology/Studies:  DG Chest Port 1 View  Result Date: 11/08/2021 CLINICAL DATA:  Chest pains and nausea. EXAM: PORTABLE CHEST 1 VIEW COMPARISON:  Portable chest 03/16/2016. FINDINGS: There are multiple overlying monitor wires. The heart is enlarged as before. No vascular congestion is seen. No pleural effusion is evident. The lungs clear of infiltrates. Moderate thoracic spondylosis. IMPRESSION: No evidence of acute chest disease.  Cardiomegaly. Electronically Signed   By: Telford Nab M.D.   On: 11/08/2021 04:33   ECHOCARDIOGRAM COMPLETE  Result Date: 11/08/2021    ECHOCARDIOGRAM REPORT   Patient Name:   Kenneth Long [facility] Date of Exam: 11/08/2021 Medical Rec #:  [id number]            Height:       69.0 in Accession #:    [id number]           Weight:       156.5 lb Date of Birth:  17-Aug-1966            BSA:          1.862 m Patient Age:    64 years             BP:           122/83 mmHg Patient Gender: M                    HR:           74 bpm. Exam Location:  Inpatient Procedure: 2D Echo Indications:    chest pain  History:        Patient has prior history of Echocardiogram examinations, most                 recent 03/17/2016. Cardiomyopathy, CAD; Risk Factors:Hypertension                 and Dyslipidemia.  Sonographer:    Johny Chess RDCS Referring Phys:  [id number] [place]  1. Akinesis of the inferolateral wall with overall mild to moderate LV dysfunction.  2. Left ventricular ejection fraction, by estimation, is 40 to 45%. The left ventricle has mild to moderately decreased function. The left ventricle demonstrates regional wall motion abnormalities (see scoring diagram/findings for description). The left  ventricular internal cavity size was mildly dilated. There is mild left ventricular hypertrophy. Left ventricular diastolic parameters are consistent with Grade II diastolic dysfunction (pseudonormalization).  3. Right ventricular systolic function is normal. The right ventricular size is normal.  4. Left atrial size was moderately dilated.  5. The mitral valve is normal in structure. Mild mitral  valve regurgitation. No evidence of mitral stenosis.  6. The aortic valve is normal in structure. Aortic valve regurgitation is not visualized. No aortic stenosis is present.  7. The inferior vena cava is normal in size with greater than 50% respiratory variability, suggesting right atrial pressure of 3 mmHg. FINDINGS  Left Ventricle: Left ventricular ejection fraction, by estimation, is 40 to 45%. The left ventricle has mild to moderately decreased function. The left ventricle demonstrates regional wall motion abnormalities. The left ventricular internal cavity size was mildly dilated. There is mild left ventricular hypertrophy. Left ventricular diastolic parameters are consistent with Grade II diastolic dysfunction (pseudonormalization). Right Ventricle: The right ventricular size is normal. Right ventricular systolic function is normal. Left Atrium: Left atrial size was moderately dilated. Right Atrium: Right atrial size was normal in size. Pericardium: There is no evidence of pericardial effusion. Mitral Valve: The mitral valve is normal in structure. Mild mitral valve regurgitation. No evidence of mitral valve stenosis. Tricuspid Valve: The  tricuspid valve is normal in structure. Tricuspid valve regurgitation is trivial. No evidence of tricuspid stenosis. Aortic Valve: The aortic valve is normal in structure. Aortic valve regurgitation is not visualized. No aortic stenosis is present. Pulmonic Valve: The pulmonic valve was not well visualized. Pulmonic valve regurgitation is not visualized. No evidence of pulmonic stenosis. Aorta: The aortic root is normal in size and structure. Venous: The inferior vena cava is normal in size with greater than 50% respiratory variability, suggesting right atrial pressure of 3 mmHg. IAS/Shunts: No atrial level shunt detected by color flow Doppler. Additional Comments: Akinesis of the inferolateral wall with overall mild to moderate LV dysfunction.  LEFT VENTRICLE PLAX 2D LVIDd:         5.40 cm      Diastology LVIDs:         4.40 cm      LV e' medial:    7.07 cm/s LV PW:         1.30 cm      LV E/e' medial:  12.9 LV IVS:        1.00 cm      LV e' lateral:   8.49 cm/s LVOT diam:     2.00 cm      LV E/e' lateral: 10.8 LV SV:         57 LV SV Index:   30 LVOT Area:     3.14 cm  LV Volumes (MOD) LV vol d, MOD A2C: 96.7 ml LV vol d, MOD A4C: 105.0 ml LV vol s, MOD A2C: 57.7 ml LV vol s, MOD A4C: 49.2 ml LV SV MOD A2C:     39.0 ml LV SV MOD A4C:     105.0 ml LV SV MOD BP:      46.9 ml RIGHT VENTRICLE             IVC RV S prime:     12.50 cm/s  IVC diam: 1.80 cm TAPSE (M-mode): 2.0 cm LEFT ATRIUM           Index        RIGHT ATRIUM           Index LA diam:      4.40 cm 2.36 cm/m   RA Area:     13.90 cm LA Vol (A2C): 52.8 ml 28.36 ml/m  RA Volume:   33.00 ml  17.73 ml/m LA Vol (A4C): 75.1 ml 40.34 ml/m  AORTIC VALVE LVOT Vmax:   104.00 cm/s LVOT Vmean:  66.000 cm/s LVOT VTI:    0.180 m  AORTA Ao Root diam: 2.70 cm Ao Asc diam:  3.00 cm MITRAL VALVE MV Area (PHT): 5.38 cm    SHUNTS MV Decel Time: 141 msec    Systemic VTI:  0.18 m MV E velocity: 91.30 cm/s  Systemic Diam: 2.00 cm MV A velocity: 76.30 cm/s MV E/A ratio:   1.20 Kirk Ruths MD Electronically signed by Kirk Ruths MD Signature Date/Time: 11/08/2021/3:32:20 PM    Final    CT Angio Chest/Abd/Pel for Dissection W and/or Wo Contrast  Result Date: 11/08/2021 CLINICAL DATA:  Chest and back pain. EXAM: CT ANGIOGRAPHY CHEST, ABDOMEN AND PELVIS TECHNIQUE: Non-contrast CT of the chest was initially obtained. Multidetector CT imaging through the chest, abdomen and pelvis was performed using the standard protocol during bolus administration of intravenous contrast. Multiplanar reconstructed images and MIPs were obtained and reviewed to evaluate the vascular anatomy. CONTRAST:  135m OMNIPAQUE IOHEXOL 350 MG/ML SOLN COMPARISON:  None. FINDINGS: CTA CHEST FINDINGS Cardiovascular: The heart size is normal. No substantial pericardial effusion. Coronary artery calcification is evident. No thoracic aortic aneurysm. Pre contrast imaging of the chest shows no hyperdense crescent in the wall of the thoracic aorta to suggest acute intramural hematoma. Postcontrast imaging shows no dissection flap in the thoracic aorta. Ascending thoracic aorta measures 3.0 cm diameter. No large central embolus in the pulmonary outflow tract, main pulmonary arteries, or lobar pulmonary arteries. Segmental and subsegmental pulmonary arteries are not reliably evaluated due to bolus timing. Mediastinum/Nodes: No mediastinal lymphadenopathy. 9 mm short axis right paratracheal lymph node is upper normal for size. There is no hilar lymphadenopathy. The esophagus has normal imaging features. There is no axillary lymphadenopathy. Lungs/Pleura: Centrilobular and paraseptal emphysema evident. No suspicious pulmonary nodule or mass. No focal airspace consolidation. No pleural effusion. Musculoskeletal: No worrisome lytic or sclerotic osseous abnormality. Review of the MIP images confirms the above findings. CTA ABDOMEN AND PELVIS FINDINGS VASCULAR Aorta: Normal caliber aorta without aneurysm, dissection,  vasculitis or significant stenosis. Celiac: Patent without evidence of aneurysm, dissection, vasculitis or significant stenosis. Replaced right hepatic artery. SMA: Patent without evidence of aneurysm, dissection, vasculitis or significant stenosis. Replaced right hepatic artery. Renals: Both renal arteries are patent without evidence of aneurysm, dissection, vasculitis, fibromuscular dysplasia or significant stenosis. Accessory right renal artery evident arising 7 cm distal to the main renal artery. IMA: Patent without evidence of aneurysm, dissection, vasculitis or significant stenosis. Inflow: Patent without evidence of aneurysm, dissection, vasculitis or significant stenosis. Veins: No obvious venous abnormality within the limitations of this arterial phase study. Review of the MIP images confirms the above findings. NON-VASCULAR Hepatobiliary: No suspicious focal abnormality within the liver parenchyma. There is no evidence for gallstones, gallbladder wall thickening, or pericholecystic fluid. No intrahepatic or extrahepatic biliary dilation. Pancreas: No focal mass lesion. No dilatation of the main duct. No intraparenchymal cyst. No peripancreatic edema. Spleen: No splenomegaly. No focal mass lesion. Adrenals/Urinary Tract: No adrenal nodule or mass. Nodular thickening noted left adrenal gland. Kidneys unremarkable. No evidence for hydroureter. Bladder is obscured by beam hardening artifact from right hip replacement. Stomach/Bowel: Stomach is unremarkable. No gastric wall thickening. No evidence of outlet obstruction. Duodenum is normally positioned as is the ligament of Treitz. No small bowel wall thickening. No small bowel dilatation. The terminal ileum is normal. The appendix is normal. No gross colonic mass. No colonic wall thickening. Rectum obscured by beam hardening artifact. Lymphatic: There is no gastrohepatic or hepatoduodenal ligament lymphadenopathy. No retroperitoneal  or mesenteric  lymphadenopathy. No pelvic sidewall lymphadenopathy. Reproductive: Prostate gland is obscured. Other: No intraperitoneal free fluid. Musculoskeletal: Right total hip replacement. No worrisome lytic or sclerotic osseous abnormality. Review of the MIP images confirms the above findings. IMPRESSION: 1. No evidence for thoracoabdominal aortic aneurysm or dissection. 2. No evidence for large central pulmonary embolus back 3. No acute findings in the chest, abdomen, or pelvis. Specifically, no findings to explain the patient's history of chest and back pain. 4. Emphysema (ICD10-J43.9). Electronically Signed   By: Misty Stanley M.D.   On: 11/08/2021 05:59     Assessment and Plan:   Chest pain/abnormal EKG -Presented with sudden onset chest pain, diaphoresis and lethargy after using cocaine (urine drug screen positive for cocaine).  He was at his friend and doings tobacco smoking and drinking alcohol.  He did felt feverish and chills without cough congestion.  Influenza and COVID are negative.  EKG with new inferior lateral T wave changes, he did not meet STEMI criteria.  Thankfully, high-sensitivity troponin are negative. -Differential includes coronary vasospasm due to cocaine use versus pericarditis -Check sed rate and ESR -Get echocardiogram -Cycle troponin -Currently chest pain free.  -Continue IV heparin for now  2.  CAD s/p DES to OM -Continue home aspirin and  statin   3.  Hypertension -Hypotensive on arrival, blood pressure now improving.  Most recent reading 119/86 -We Sebrina Kessner hold all antihypertensive regimen for now, gradually add as blood pressure allows  4.  Hyperlipidemia -No results found for requested labs within last 8760 hours.  -Check lipid panel -Continue Lipitor 80 mg daily and Vascepa 2 mg twice daily  5.  Polysubstance abuse -Commended cessation   Risk Assessment/Risk Scores:   HEAR Score (for undifferentiated chest pain):  HEAR Score: 4{   Severity of Illness: The  appropriate patient status for this patient is OBSERVATION. Observation status is judged to be reasonable and necessary in order to provide the required intensity of service to ensure the patient's safety. The patient's presenting symptoms, physical exam findings, and initial radiographic and laboratory data in the context of their medical condition is felt to place them at decreased risk for further clinical deterioration. Furthermore, it is anticipated that the patient Arnell Mausolf be medically stable for discharge from the hospital within 2 midnights of admission.    For questions or updates, please contact [place] Please consult [email or web address] for contact info under     Leanor Kail, [place] 11/08/2021 3:41 PM    I have seen and examined this patient with Leanor Kail.  Agree with above, note added to reflect my findings.  On exam, regular, no murmurs, lungs clear, no JVD, no lower extremity edema.  Patient presented to hospital with chest pain.  Fortunately troponins are negative. He patient states that he was a friend's house when chest pain began.  He was drinking a beer, but did not drink the whole thing.  He developed chest pain over the center of his chest with shortness of breath.  Patient was given nitroglycerin with improvement in his chest pain but not resolution.  After a few minutes, the chest pain came back.  He cannot tell me how long the chest pain lasted or if anything was done to fully resolve the pain.  In the emergency room, he is feeling well.  He is currently on heparin.  ECG shows significant T wave inversions in inferior and lateral leads.  Transthoracic echo is currently pending.  The patient told the PA that  he had also been smoking both tobacco and marijuana but he denies this to me as his sister is in the room.  The patient's drug screen is also positive for cocaine.  Based on his ECG changes, he may benefit from an ischemic evaluation, but [place] [place] wait for echo  results.  Addysin Porco M. Jillien Yakel MD t 11/08/2021 3:41 PM

## 2021-11-08 NOTE — H&P (Addendum)
error 

## 2021-11-08 NOTE — ED Notes (Signed)
Paged Dr. Saunders Revel to Dr.  Leonette Monarch

## 2021-11-08 NOTE — ED Notes (Signed)
Pt transported to Echo via stretcher at this time.

## 2021-11-08 NOTE — H&P (Incomplete Revision)
Cardiology Admission History and Physical:   Patient ID: Kenneth Long MRN: [id number]; DOB: 1966-04-08   Admission date: 11/08/2021  PCP:  Julieanne Manson, MD   [facility] Providers Cardiologist:  Peter Swaziland, MD   {  Chief Complaint:  Chest pain   Patient Profile:   Kenneth Long is a 55 y.o. male with history of CAD s/p DES to OM, hypertension, hyperlipidemia, and polysubstance abuse  who is being seen 11/08/2021 for the evaluation of chest pain at request of Dr. Rush Landmark.  History of inferior STEMI March 2017.  Cardiac cath showed obstructive second OM s/p DES placement.  Otherwise no obstructive disease.  Echo showed LV function of 40 to 45% without wall motion abnormality.  He underwent right total hip replacement 08/2017 without complication.  Myoview September 2019 showing scar without evidence of ischemia.  EF stable at 45%.  He was doing relatively well and when last seen by Dr. Swaziland March 2021.  History of Present Illness:   Mr. Spratlin was at his friend's house last night.  He felt fever and chills without checking his temperature.  Reports he was drinking beer and smoking tobacco and marijuana.  He declined using cocaine.  Says his friend was smoking cocaine.  However, his urine drug screen positive for cocaine.  After using this, he had sudden onset chest pain and felt diaphoretic and lethargic.  Took sublingual nitroglycerin without improvement.  His friend brought him to ER.  Upon arrival, he was lethargic, pale and diaphoretic.  He required sternal rub for response.  Patient was hypotensive and given fluid bolus.  EKG concerning for inferior STEMI.  Case discussed with Dr. Okey Dupre, he did not meet STEMI criteria.  CT angio negative for dissection or pulmonary embolism.  He was started on heparin drip after discussion with cardiologist.  Troponin has remained negative.  Alcohol level negative.  Respiratory panel negative for influenza and  COVID.  Patient denies any chest pain or shortness of breath.  Denies palpitation or melena.  He reports compliance with his medication.  No regular exercise due to arthritis.  Reports 2 days history of left lower extremity swelling which has improved. No orthopnea or PND.   Past Medical History:  Diagnosis Date   Allergy    SEASONAL   Arthritis    Asthma    Colon adenomas 08/07/2019   Dr. Christella Hartigan 5 Tubular adenomas without high grade dysplasia.   Coronary artery disease    Dyspnea    "sometimes" with exertion    Headache    Heart murmur    Hyperlipidemia    Hypertension    Sleep apnea    no CPAP   ST elevation (STEMI) myocardial infarction involving left circumflex coronary artery (HCC) 03/16/2016   DES CFX   Umbilical hernia     Past Surgical History:  Procedure Laterality Date   CARDIAC CATHETERIZATION N/A 03/16/2016   Procedure: Left Heart Cath and Coronary Angiography;  Surgeon: Peter M Swaziland, MD;  LAD OK, CFX 100%, OM1 20%, RCA 20%, EF nl   CARDIAC CATHETERIZATION N/A 03/16/2016   Procedure: Coronary Stent Intervention;  Surgeon: Peter M Swaziland, MD; PROMUS PREM MR 3.0X16 mm DES    TOTAL HIP ARTHROPLASTY Right 08/24/2017   Procedure: RIGHT TOTAL HIP ARTHROPLASTY ANTERIOR APPROACH;  Surgeon: Tarry Kos, MD;  Location: [facility] OR;  Service: Orthopedics;  Laterality: Right;  RIGHT TOTAL HIP ARTHROPLASTY ANTERIOR APPROACH   UMBILICAL HERNIA REPAIR  2003   umbilical hernia repair  Medications Prior to Admission: Prior to Admission medications   Medication Sig Start Date End Date Taking? Authorizing Provider  albuterol (VENTOLIN HFA) 108 (90 Base) MCG/ACT inhaler Inhale 2 puffs into the lungs every 6 (six) hours as needed for wheezing or shortness of breath. 01/30/20   Mack Hook, MD  amLODipine (NORVASC) 10 MG tablet Take 1 tablet (10 mg total) by mouth daily. 05/12/20   Mack Hook, MD  aspirin EC 81 MG tablet Take 1 tablet (81 mg total) by mouth daily. 12/26/17    Martinique, Peter M, MD  atorvastatin (LIPITOR) 80 MG tablet Take 1 tablet (80 mg total) by mouth daily. 05/12/20   Mack Hook, MD  carvedilol (COREG) 25 MG tablet Take 1 tablet (25 mg total) by mouth 2 (two) times daily. 05/12/20 05/07/21  Mack Hook, MD  lisinopril-hydrochlorothiazide (ZESTORETIC) 20-25 MG tablet 1 tab by mouth daily 05/12/20   Mack Hook, MD  loratadine (CLARITIN) 10 MG tablet Take 1 tablet (10 mg total) by mouth daily. 09/22/20   Mack Hook, MD  mometasone (NASONEX) 50 MCG/ACT nasal spray 2 sprays each nostril daily 05/12/20   Mack Hook, MD  nitroGLYCERIN (NITROSTAT) 0.4 MG SL tablet Place 1 tablet (0.4 mg total) under the tongue every 5 (five) minutes as needed for chest pain. 02/06/19   Mack Hook, MD  olopatadine (PATADAY) 0.1 % ophthalmic solution Place 1 drop into both eyes 2 (two) times daily. 09/22/20   Mack Hook, MD  tamsulosin (FLOMAX) 0.4 MG CAPS capsule 1 cap by mouth at bedtime. 05/12/20   Mack Hook, MD  VASCEPA 1 g capsule Take 2 capsules by mouth twice daily 10/30/20   Mack Hook, MD     Allergies:    Allergies  Allergen Reactions   Peanut-Containing Drug Products Anaphylaxis and Swelling   Shellfish Allergy Anaphylaxis and Swelling   Other     IV Lesiscan- tht iritation with a myocardial perf study 09-13-2018    Social History:   Social History   Socioeconomic History   Marital status: Single    Spouse name: Not on file   Number of children: 2   Years of education: Not on file   Highest education level: Not on file  Occupational History   Occupation: Previously supervised Solicitor at [employer] Use   Smoking status: Former    Types: Cigarettes    Quit date: 03/18/2016    Years since quitting: 5.6   Smokeless tobacco: Never  Vaping Use   Vaping Use: Never used  Substance and Sexual Activity   Alcohol use: Yes    Alcohol/week: 0.0 standard drinks     Comment: 2 beers every few days   Drug use: No   Sexual activity: Yes    Comment: since 2019, problems with decreased erection.  Other Topics Concern   Not on file  Social History Narrative   Lives by himself   Unemployed due to hip problems   Born and raised in [place], [place] early [place]s.   Social Determinants of Health   Financial Resource Strain: Not on file  Food Insecurity: Not on file  Transportation Needs: Not on file  Physical Activity: Not on file  Stress: Not on file  Social Connections: Not on file  Intimate Partner Violence: Not on file    Family History:   The patient's family history includes Hypertension in his father, mother, sister, and sister; Sarcoidosis in his mother. There is no history  of Colon cancer, Colon polyps, Esophageal cancer, Stomach cancer, or Rectal cancer.    ROS:  Please see the history of present illness.  All other ROS reviewed and negative.     Physical Exam/Data:   Vitals:   11/08/21 1019 11/08/21 1030 11/08/21 1045 11/08/21 1100  BP: 110/79 125/90 113/76 119/86  Pulse: 85 72 88 88  Resp: (!) _0 Temp:      TempSrc:      SpO2: 95% 97% 97% 94%  Weight:      Height:        Intake/Output Summary (Last 24 hours) at 11/08/2021 1119 Last data filed at 11/08/2021 1030 Gross per 24 hour  Intake 1000.51 ml  Output --  Net 1000.51 ml   Last 3 Weights 11/08/2021 09/22/2020 02/18/2020  Weight (lbs) 156 lb 8.4 oz 156 lb 164 lb  Weight (kg) 71 kg 70.761 kg 74.39 kg     Body mass index is 23.11 kg/m.  General:  Well nourished, well developed, in no acute distress HEENT: normal Neck: no JVD Vascular: No carotid bruits; Distal pulses 2+ bilaterally   Cardiac:  normal S1, S2; RRR; no murmur  Lungs:  clear to auscultation bilaterally, no wheezing, rhonchi or rales  Abd: soft, nontender, no hepatomegaly  Ext:1 + LLE edema Musculoskeletal:  No deformities, BUE and BLE strength normal and  equal Skin: warm and dry  Neuro:  CNs 2-12 intact, no focal abnormalities noted Psych:  Normal affect    EKG:  The ECG that was done today was personally reviewed and demonstrates Sr, TWI inferior lateral leads   Relevant CV Studies:  Stress test 08/2018 Nuclear stress EF: 45%. The left ventricular ejection fraction is mildly decreased (45-54%). There was no ST segment deviation noted during stress. No T wave inversion was noted during stress. Defect 1: There is a small defect of severe severity present in the basal inferolateral and mid inferolateral location. Findings consistent with prior myocardial infarction. This is an intermediate risk study due to reduced systolic function. There is no ischemia.  Echo 02/2016 - Left ventricle: Posterior and lateral wall hypokinesis The cavity    size was moderately dilated. Systolic function was mildly to    moderately reduced. The estimated ejection fraction was in the    range of 40% to 45%. Wall motion was normal; there were no    regional wall motion abnormalities.  - Mitral valve: There was mild regurgitation.  - Left atrium: The atrium was mildly dilated.  - Atrial septum: No defect or patent foramen ovale was identified.  Left Heart Cath and Coronary Angiography 02/2016   Conclusion  Prox RCA to Mid RCA lesion, 20% stenosed. 1st Mrg lesion, 20% stenosed. The left ventricular systolic function is normal. 2nd Mrg lesion, 100% stenosed. Post intervention, there is a 0% residual stenosis.   1. Single vessel occlusive CAD 2. Low normal LV function 3. Successful stenting of the second OM with a DES   Plan: DAPT for one year. Patient may be a candidate for fast track discharge if no complications.   Laboratory Data:  High Sensitivity Troponin:   Recent Labs  Lab 11/08/21 0418 11/08/21 0616  TROPONINIHS 13 15      Chemistry Recent Labs  Lab 11/08/21 0418 11/08/21 0424  NA 137 140  K 3.7 3.8  CL 102 102  CO2 25  --    GLUCOSE 143* 139*  BUN 8 9  CREATININE 1.19 1.10  CALCIUM 9.1  --  GFRNONAA >60  --   ANIONGAP 10  --     Recent Labs  Lab 11/08/21 0418  PROT 7.2  ALBUMIN 3.6  AST 12*  ALT 8  ALKPHOS 60  BILITOT 0.6   Hematology Recent Labs  Lab 11/08/21 0418 11/08/21 0424  WBC 9.4  --   RBC 4.53  --   HGB 13.7 14.6  HCT 42.7 43.0  MCV 94.3  --   MCH 30.2  --   MCHC 32.1  --   RDW 13.3  --   PLT 342  --    Thyroid No results for input(s): TSH, FREET4 in the last 168 hours. BNP Recent Labs  Lab 11/08/21 0419  BNP 47.7    DDimer No results for input(s): DDIMER in the last 168 hours.   Radiology/Studies:  DG Chest Port 1 View  Result Date: 11/08/2021 CLINICAL DATA:  Chest pains and nausea. EXAM: PORTABLE CHEST 1 VIEW COMPARISON:  Portable chest 03/16/2016. FINDINGS: There are multiple overlying monitor wires. The heart is enlarged as before. No vascular congestion is seen. No pleural effusion is evident. The lungs clear of infiltrates. Moderate thoracic spondylosis. IMPRESSION: No evidence of acute chest disease.  Cardiomegaly. Electronically Signed   By: Telford Nab M.D.   On: 11/08/2021 04:33   CT Angio Chest/Abd/Pel for Dissection W and/or Wo Contrast  Result Date: 11/08/2021 CLINICAL DATA:  Chest and back pain. EXAM: CT ANGIOGRAPHY CHEST, ABDOMEN AND PELVIS TECHNIQUE: Non-contrast CT of the chest was initially obtained. Multidetector CT imaging through the chest, abdomen and pelvis was performed using the standard protocol during bolus administration of intravenous contrast. Multiplanar reconstructed images and MIPs were obtained and reviewed to evaluate the vascular anatomy. CONTRAST:  128m OMNIPAQUE IOHEXOL 350 MG/ML SOLN COMPARISON:  None. FINDINGS: CTA CHEST FINDINGS Cardiovascular: The heart size is normal. No substantial pericardial effusion. Coronary artery calcification is evident. No thoracic aortic aneurysm. Pre contrast imaging of the chest shows no hyperdense  crescent in the wall of the thoracic aorta to suggest acute intramural hematoma. Postcontrast imaging shows no dissection flap in the thoracic aorta. Ascending thoracic aorta measures 3.0 cm diameter. No large central embolus in the pulmonary outflow tract, main pulmonary arteries, or lobar pulmonary arteries. Segmental and subsegmental pulmonary arteries are not reliably evaluated due to bolus timing. Mediastinum/Nodes: No mediastinal lymphadenopathy. 9 mm short axis right paratracheal lymph node is upper normal for size. There is no hilar lymphadenopathy. The esophagus has normal imaging features. There is no axillary lymphadenopathy. Lungs/Pleura: Centrilobular and paraseptal emphysema evident. No suspicious pulmonary nodule or mass. No focal airspace consolidation. No pleural effusion. Musculoskeletal: No worrisome lytic or sclerotic osseous abnormality. Review of the MIP images confirms the above findings. CTA ABDOMEN AND PELVIS FINDINGS VASCULAR Aorta: Normal caliber aorta without aneurysm, dissection, vasculitis or significant stenosis. Celiac: Patent without evidence of aneurysm, dissection, vasculitis or significant stenosis. Replaced right hepatic artery. SMA: Patent without evidence of aneurysm, dissection, vasculitis or significant stenosis. Replaced right hepatic artery. Renals: Both renal arteries are patent without evidence of aneurysm, dissection, vasculitis, fibromuscular dysplasia or significant stenosis. Accessory right renal artery evident arising 7 cm distal to the main renal artery. IMA: Patent without evidence of aneurysm, dissection, vasculitis or significant stenosis. Inflow: Patent without evidence of aneurysm, dissection, vasculitis or significant stenosis. Veins: No obvious venous abnormality within the limitations of this arterial phase study. Review of the MIP images confirms the above findings. NON-VASCULAR Hepatobiliary: No suspicious focal abnormality within the liver parenchyma.  There  is no evidence for gallstones, gallbladder wall thickening, or pericholecystic fluid. No intrahepatic or extrahepatic biliary dilation. Pancreas: No focal mass lesion. No dilatation of the main duct. No intraparenchymal cyst. No peripancreatic edema. Spleen: No splenomegaly. No focal mass lesion. Adrenals/Urinary Tract: No adrenal nodule or mass. Nodular thickening noted left adrenal gland. Kidneys unremarkable. No evidence for hydroureter. Bladder is obscured by beam hardening artifact from right hip replacement. Stomach/Bowel: Stomach is unremarkable. No gastric wall thickening. No evidence of outlet obstruction. Duodenum is normally positioned as is the ligament of Treitz. No small bowel wall thickening. No small bowel dilatation. The terminal ileum is normal. The appendix is normal. No gross colonic mass. No colonic wall thickening. Rectum obscured by beam hardening artifact. Lymphatic: There is no gastrohepatic or hepatoduodenal ligament lymphadenopathy. No retroperitoneal or mesenteric lymphadenopathy. No pelvic sidewall lymphadenopathy. Reproductive: Prostate gland is obscured. Other: No intraperitoneal free fluid. Musculoskeletal: Right total hip replacement. No worrisome lytic or sclerotic osseous abnormality. Review of the MIP images confirms the above findings. IMPRESSION: 1. No evidence for thoracoabdominal aortic aneurysm or dissection. 2. No evidence for large central pulmonary embolus back 3. No acute findings in the chest, abdomen, or pelvis. Specifically, no findings to explain the patient's history of chest and back pain. 4. Emphysema (ICD10-J43.9). Electronically Signed   By: Misty Stanley M.D.   On: 11/08/2021 05:59     Assessment and Plan:   Chest pain/abnormal EKG -Presented with sudden onset chest pain, diaphoresis and lethargy after using cocaine (urine drug screen positive for cocaine).  He was at his friend and doings tobacco smoking and drinking alcohol.  He did felt feverish  and chills without cough congestion.  Influenza and COVID are negative.  EKG with new inferior lateral T wave changes, he did not meet STEMI criteria.  Thankfully, high-sensitivity troponin are negative. -Differential includes coronary vasospasm due to cocaine use versus pericarditis -Check sed rate and ESR -Get echocardiogram -Cycle troponin -Currently chest pain free.  -Continue IV heparin for now  2.  CAD s/p DES to OM -Continue home aspirin and  statin   3.  Hypertension -Hypotensive on arrival, blood pressure now improving.  Most recent reading 119/86 -We will hold all antihypertensive regimen for now, gradually add as blood pressure allows  4.  Hyperlipidemia -No results found for requested labs within last 8760 hours.  -Check lipid panel -Continue Lipitor 80 mg daily and Vascepa 2 mg twice daily  5.  Polysubstance abuse -Commended cessation   Risk Assessment/Risk Scores:   HEAR Score (for undifferentiated chest pain):  HEAR Score: 4{   Severity of Illness: The appropriate patient status for this patient is OBSERVATION. Observation status is judged to be reasonable and necessary in order to provide the required intensity of service to ensure the patient's safety. The patient's presenting symptoms, physical exam findings, and initial radiographic and laboratory data in the context of their medical condition is felt to place them at decreased risk for further clinical deterioration. Furthermore, it is anticipated that the patient will be medically stable for discharge from the hospital within 2 midnights of admission.    For questions or updates, please contact [facility] Please consult [email or web address] for contact info under     Jarrett Soho, PA  11/08/2021 11:19 AM    Attending note  Patient seen and disucssed with PA Bhagat, I agree with his documentaiton.

## 2021-11-09 DIAGNOSIS — R072 Precordial pain: Secondary | ICD-10-CM | POA: Diagnosis not present

## 2021-11-09 DIAGNOSIS — I959 Hypotension, unspecified: Secondary | ICD-10-CM | POA: Diagnosis not present

## 2021-11-09 DIAGNOSIS — J45909 Unspecified asthma, uncomplicated: Secondary | ICD-10-CM | POA: Diagnosis not present

## 2021-11-09 DIAGNOSIS — Z79899 Other long term (current) drug therapy: Secondary | ICD-10-CM | POA: Diagnosis not present

## 2021-11-09 DIAGNOSIS — E876 Hypokalemia: Secondary | ICD-10-CM | POA: Diagnosis not present

## 2021-11-09 DIAGNOSIS — Z87891 Personal history of nicotine dependence: Secondary | ICD-10-CM | POA: Diagnosis not present

## 2021-11-09 DIAGNOSIS — R079 Chest pain, unspecified: Secondary | ICD-10-CM | POA: Diagnosis not present

## 2021-11-09 DIAGNOSIS — I2510 Atherosclerotic heart disease of native coronary artery without angina pectoris: Secondary | ICD-10-CM | POA: Diagnosis not present

## 2021-11-09 DIAGNOSIS — R0602 Shortness of breath: Secondary | ICD-10-CM | POA: Diagnosis not present

## 2021-11-09 DIAGNOSIS — Z7982 Long term (current) use of aspirin: Secondary | ICD-10-CM | POA: Diagnosis not present

## 2021-11-09 DIAGNOSIS — F1910 Other psychoactive substance abuse, uncomplicated: Secondary | ICD-10-CM

## 2021-11-09 DIAGNOSIS — I10 Essential (primary) hypertension: Secondary | ICD-10-CM | POA: Diagnosis not present

## 2021-11-09 DIAGNOSIS — Z20822 Contact with and (suspected) exposure to covid-19: Secondary | ICD-10-CM | POA: Diagnosis not present

## 2021-11-09 LAB — CBC
HCT: 39.5 % (ref 39.0–52.0)
Hemoglobin: 12.7 g/dL — ABNORMAL LOW (ref 13.0–17.0)
MCH: 30.2 pg (ref 26.0–34.0)
MCHC: 32.2 g/dL (ref 30.0–36.0)
MCV: 94 fL (ref 80.0–100.0)
Platelets: 291 10*3/uL (ref 150–400)
RBC: 4.2 MIL/uL — ABNORMAL LOW (ref 4.22–5.81)
RDW: 13.2 % (ref 11.5–15.5)
WBC: 9 10*3/uL (ref 4.0–10.5)
nRBC: 0 % (ref 0.0–0.2)

## 2021-11-09 LAB — BASIC METABOLIC PANEL WITH GFR
Anion gap: 8 (ref 5–15)
BUN: 5 mg/dL — ABNORMAL LOW (ref 6–20)
CO2: 27 mmol/L (ref 22–32)
Calcium: 9 mg/dL (ref 8.9–10.3)
Chloride: 106 mmol/L (ref 98–111)
Creatinine, Ser: 0.84 mg/dL (ref 0.61–1.24)
GFR, Estimated: >60 mL/min
Glucose, Bld: 103 mg/dL — ABNORMAL HIGH (ref 70–99)
Potassium: 3.2 mmol/L — ABNORMAL LOW (ref 3.5–5.1)
Sodium: 141 mmol/L (ref 135–145)

## 2021-11-09 LAB — LIPID PANEL
Cholesterol: 119 mg/dL (ref 0–200)
HDL: 34 mg/dL — ABNORMAL LOW (ref >40)
LDL Cholesterol: 64 mg/dL (ref 0–99)
Total CHOL/HDL Ratio: 3.5 RATIO
Triglycerides: 106 mg/dL (ref <150)
VLDL: 21 mg/dL (ref 0–40)

## 2021-11-09 LAB — HEPARIN LEVEL (UNFRACTIONATED): Heparin Unfractionated: 0.2 IU/mL — ABNORMAL LOW (ref 0.30–0.70)

## 2021-11-09 MED ORDER — POTASSIUM CHLORIDE CRYS ER 20 MEQ PO TBCR: 40.0000 meq | EXTENDED_RELEASE_TABLET | Freq: Once | ORAL | Status: DC

## 2021-11-09 MED ORDER — HEPARIN BOLUS VIA INFUSION
2000.0000 [IU] | Freq: Once | INTRAVENOUS | Status: AC
  Administered 2021-11-09: 2000 [IU] via INTRAVENOUS
  Filled 2021-11-09: qty 2000

## 2021-11-09 MED ORDER — POTASSIUM CHLORIDE CRYS ER 20 MEQ PO TBCR
60.0000 meq | EXTENDED_RELEASE_TABLET | Freq: Once | ORAL | Status: AC
  Administered 2021-11-09: 60 meq via ORAL
  Filled 2021-11-09: qty 3

## 2021-11-09 NOTE — Progress Notes (Signed)
Progress Note  Patient Name: Kenneth Long Date of Encounter: 11/09/2021  [facility] Cardiologist: Peter Martinique, MD   Subjective   No chest pain this am.   Inpatient Medications    Scheduled Meds:  aspirin EC  81 mg Oral Daily   atorvastatin  80 mg Oral Daily   icosapent Ethyl  2 g Oral BID   tamsulosin  0.4 mg Oral QHS   Continuous Infusions:  sodium chloride 1,000 mL (11/09/21 0346)   heparin 1,500 Units/hr (11/09/21 0657)   PRN Meds: acetaminophen, nitroGLYCERIN, ondansetron (ZOFRAN) IV   Vital Signs    Vitals:   11/09/21 0042 11/09/21 0306 11/09/21 0306 11/09/21 0751  BP: (!) 136/96  (!) 148/97 (!) 151/109  Pulse: 73  79 86  Resp: 17  17 16   Temp: 99.1 F (37.3 C)  98 F (36.7 C) (!) 97.5 F (36.4 C)  TempSrc: Oral  Oral Oral  SpO2: 100%  98% 99%  Weight:  70.5 kg    Height:        Intake/Output Summary (Last 24 hours) at 11/09/2021 0754 Last data filed at 11/09/2021 0346 Gross per 24 hour  Intake 3144.89 ml  Output 2325 ml  Net 819.89 ml   Last 3 Weights 11/09/2021 11/08/2021 11/08/2021  Weight (lbs) 155 lb 8 oz 156 lb 1.4 oz 156 lb 8.4 oz  Weight (kg) 70.534 kg 70.8 kg 71 kg      Telemetry    Sinus - Personally Reviewed  ECG    Sinus, chronic T wave inversions and early repol in precordial leads - Personally Reviewed  Physical Exam   GEN: No acute distress.   Neck: No JVD Cardiac: RRR, no murmurs, rubs, or gallops.  Respiratory: Clear to auscultation bilaterally. GI: Soft, nontender, non-distended  MS: No edema; No deformity. Neuro:  Nonfocal  Psych: Normal affect   Labs    High Sensitivity Troponin:   Recent Labs  Lab 11/08/21 0418 11/08/21 0616 11/08/21 1816  TROPONINIHS 13 15 15      Chemistry Recent Labs  Lab 11/08/21 0418 11/08/21 0424 11/08/21 1816 11/09/21 0458  NA 137 140  --  141  K 3.7 3.8  --  3.2*  CL 102 102  --  106  CO2 25  --   --  27  GLUCOSE 143* 139*  --  103*  BUN 8 9  --  5*   CREATININE 1.19 1.10 0.85 0.84  CALCIUM 9.1  --   --  9.0  PROT 7.2  --   --   --   ALBUMIN 3.6  --   --   --   AST 12*  --   --   --   ALT 8  --   --   --   ALKPHOS 60  --   --   --   BILITOT 0.6  --   --   --   GFRNONAA >60  --  >60 >60  ANIONGAP 10  --   --  8    Lipids  Recent Labs  Lab 11/09/21 0458  CHOL 119  TRIG 106  HDL 34*  LDLCALC 64  CHOLHDL 3.5    Hematology Recent Labs  Lab 11/08/21 0418 11/08/21 0424 11/08/21 1816 11/09/21 0458  WBC 9.4  --  8.8 9.0  RBC 4.53  --  4.43 4.20*  HGB 13.7 14.6 13.2 12.7*  HCT 42.7 43.0 42.0 39.5  MCV 94.3  --  94.8 94.0  MCH  30.2  --  29.8 30.2  MCHC 32.1  --  31.4 32.2  RDW 13.3  --  13.2 13.2  PLT 342  --  275 291   Thyroid No results for input(s): TSH, FREET4 in the last 168 hours.  BNP Recent Labs  Lab 11/08/21 0419  BNP 47.7    DDimer No results for input(s): DDIMER in the last 168 hours.   Radiology    DG Chest Port 1 View  Result Date: 11/08/2021 CLINICAL DATA:  Chest pains and nausea. EXAM: PORTABLE CHEST 1 VIEW COMPARISON:  Portable chest 03/16/2016. FINDINGS: There are multiple overlying monitor wires. The heart is enlarged as before. No vascular congestion is seen. No pleural effusion is evident. The lungs clear of infiltrates. Moderate thoracic spondylosis. IMPRESSION: No evidence of acute chest disease.  Cardiomegaly. Electronically Signed   By: Telford Nab M.D.   On: 11/08/2021 04:33   ECHOCARDIOGRAM COMPLETE  Result Date: 11/08/2021    ECHOCARDIOGRAM REPORT   Patient Name:   Kenneth Long [facility] Date of Exam: 11/08/2021 Medical Rec #:  [id number]            Height:       69.0 in Accession #:    [id number]           Weight:       156.5 lb Date of Birth:  May 14, 1966            BSA:          1.862 m Patient Age:    66 years             BP:           122/83 mmHg Patient Gender: M                    HR:           74 bpm. Exam Location:  Inpatient Procedure: 2D Echo Indications:    chest pain  History:         Patient has prior history of Echocardiogram examinations, most                 recent 03/17/2016. Cardiomyopathy, CAD; Risk Factors:Hypertension                 and Dyslipidemia.  Sonographer:    Johny Chess RDCS Referring Phys: [id number] [place]  1. Akinesis of the inferolateral wall with overall mild to moderate LV dysfunction.  2. Left ventricular ejection fraction, by estimation, is 40 to 45%. The left ventricle has mild to moderately decreased function. The left ventricle demonstrates regional wall motion abnormalities (see scoring diagram/findings for description). The left  ventricular internal cavity size was mildly dilated. There is mild left ventricular hypertrophy. Left ventricular diastolic parameters are consistent with Grade II diastolic dysfunction (pseudonormalization).  3. Right ventricular systolic function is normal. The right ventricular size is normal.  4. Left atrial size was moderately dilated.  5. The mitral valve is normal in structure. Mild mitral valve regurgitation. No evidence of mitral stenosis.  6. The aortic valve is normal in structure. Aortic valve regurgitation is not visualized. No aortic stenosis is present.  7. The inferior vena cava is normal in size with greater than 50% respiratory variability, suggesting right atrial pressure of 3 mmHg. FINDINGS  Left Ventricle: Left ventricular ejection fraction, by estimation, is 40 to 45%. The left ventricle has mild to moderately decreased function. The left ventricle demonstrates regional wall motion  abnormalities. The left ventricular internal cavity size was mildly dilated. There is mild left ventricular hypertrophy. Left ventricular diastolic parameters are consistent with Grade II diastolic dysfunction (pseudonormalization). Right Ventricle: The right ventricular size is normal. Right ventricular systolic function is normal. Left Atrium: Left atrial size was moderately dilated. Right Atrium: Right  atrial size was normal in size. Pericardium: There is no evidence of pericardial effusion. Mitral Valve: The mitral valve is normal in structure. Mild mitral valve regurgitation. No evidence of mitral valve stenosis. Tricuspid Valve: The tricuspid valve is normal in structure. Tricuspid valve regurgitation is trivial. No evidence of tricuspid stenosis. Aortic Valve: The aortic valve is normal in structure. Aortic valve regurgitation is not visualized. No aortic stenosis is present. Pulmonic Valve: The pulmonic valve was not well visualized. Pulmonic valve regurgitation is not visualized. No evidence of pulmonic stenosis. Aorta: The aortic root is normal in size and structure. Venous: The inferior vena cava is normal in size with greater than 50% respiratory variability, suggesting right atrial pressure of 3 mmHg. IAS/Shunts: No atrial level shunt detected by color flow Doppler. Additional Comments: Akinesis of the inferolateral wall with overall mild to moderate LV dysfunction.  LEFT VENTRICLE PLAX 2D LVIDd:         5.40 cm      Diastology LVIDs:         4.40 cm      LV e' medial:    7.07 cm/s LV PW:         1.30 cm      LV E/e' medial:  12.9 LV IVS:        1.00 cm      LV e' lateral:   8.49 cm/s LVOT diam:     2.00 cm      LV E/e' lateral: 10.8 LV SV:         57 LV SV Index:   30 LVOT Area:     3.14 cm  LV Volumes (MOD) LV vol d, MOD A2C: 96.7 ml LV vol d, MOD A4C: 105.0 ml LV vol s, MOD A2C: 57.7 ml LV vol s, MOD A4C: 49.2 ml LV SV MOD A2C:     39.0 ml LV SV MOD A4C:     105.0 ml LV SV MOD BP:      46.9 ml RIGHT VENTRICLE             IVC RV S prime:     12.50 cm/s  IVC diam: 1.80 cm TAPSE (M-mode): 2.0 cm LEFT ATRIUM           Index        RIGHT ATRIUM           Index LA diam:      4.40 cm 2.36 cm/m   RA Area:     13.90 cm LA Vol (A2C): 52.8 ml 28.36 ml/m  RA Volume:   33.00 ml  17.73 ml/m LA Vol (A4C): 75.1 ml 40.34 ml/m  AORTIC VALVE LVOT Vmax:   104.00 cm/s LVOT Vmean:  66.000 cm/s LVOT VTI:    0.180 m   AORTA Ao Root diam: 2.70 cm Ao Asc diam:  3.00 cm MITRAL VALVE MV Area (PHT): 5.38 cm    SHUNTS MV Decel Time: 141 msec    Systemic VTI:  0.18 m MV E velocity: 91.30 cm/s  Systemic Diam: 2.00 cm MV A velocity: 76.30 cm/s MV E/A ratio:  1.20 Kirk Ruths MD Electronically signed by Kirk Ruths MD Signature Date/Time: 11/08/2021/3:32:20  PM    Final    CT Angio Chest/Abd/Pel for Dissection W and/or Wo Contrast  Result Date: 11/08/2021 CLINICAL DATA:  Chest and back pain. EXAM: CT ANGIOGRAPHY CHEST, ABDOMEN AND PELVIS TECHNIQUE: Non-contrast CT of the chest was initially obtained. Multidetector CT imaging through the chest, abdomen and pelvis was performed using the standard protocol during bolus administration of intravenous contrast. Multiplanar reconstructed images and MIPs were obtained and reviewed to evaluate the vascular anatomy. CONTRAST:  119mL OMNIPAQUE IOHEXOL 350 MG/ML SOLN COMPARISON:  None. FINDINGS: CTA CHEST FINDINGS Cardiovascular: The heart size is normal. No substantial pericardial effusion. Coronary artery calcification is evident. No thoracic aortic aneurysm. Pre contrast imaging of the chest shows no hyperdense crescent in the wall of the thoracic aorta to suggest acute intramural hematoma. Postcontrast imaging shows no dissection flap in the thoracic aorta. Ascending thoracic aorta measures 3.0 cm diameter. No large central embolus in the pulmonary outflow tract, main pulmonary arteries, or lobar pulmonary arteries. Segmental and subsegmental pulmonary arteries are not reliably evaluated due to bolus timing. Mediastinum/Nodes: No mediastinal lymphadenopathy. 9 mm short axis right paratracheal lymph node is upper normal for size. There is no hilar lymphadenopathy. The esophagus has normal imaging features. There is no axillary lymphadenopathy. Lungs/Pleura: Centrilobular and paraseptal emphysema evident. No suspicious pulmonary nodule or mass. No focal airspace consolidation. No pleural  effusion. Musculoskeletal: No worrisome lytic or sclerotic osseous abnormality. Review of the MIP images confirms the above findings. CTA ABDOMEN AND PELVIS FINDINGS VASCULAR Aorta: Normal caliber aorta without aneurysm, dissection, vasculitis or significant stenosis. Celiac: Patent without evidence of aneurysm, dissection, vasculitis or significant stenosis. Replaced right hepatic artery. SMA: Patent without evidence of aneurysm, dissection, vasculitis or significant stenosis. Replaced right hepatic artery. Renals: Both renal arteries are patent without evidence of aneurysm, dissection, vasculitis, fibromuscular dysplasia or significant stenosis. Accessory right renal artery evident arising 7 cm distal to the main renal artery. IMA: Patent without evidence of aneurysm, dissection, vasculitis or significant stenosis. Inflow: Patent without evidence of aneurysm, dissection, vasculitis or significant stenosis. Veins: No obvious venous abnormality within the limitations of this arterial phase study. Review of the MIP images confirms the above findings. NON-VASCULAR Hepatobiliary: No suspicious focal abnormality within the liver parenchyma. There is no evidence for gallstones, gallbladder wall thickening, or pericholecystic fluid. No intrahepatic or extrahepatic biliary dilation. Pancreas: No focal mass lesion. No dilatation of the main duct. No intraparenchymal cyst. No peripancreatic edema. Spleen: No splenomegaly. No focal mass lesion. Adrenals/Urinary Tract: No adrenal nodule or mass. Nodular thickening noted left adrenal gland. Kidneys unremarkable. No evidence for hydroureter. Bladder is obscured by beam hardening artifact from right hip replacement. Stomach/Bowel: Stomach is unremarkable. No gastric wall thickening. No evidence of outlet obstruction. Duodenum is normally positioned as is the ligament of Treitz. No small bowel wall thickening. No small bowel dilatation. The terminal ileum is normal. The appendix  is normal. No gross colonic mass. No colonic wall thickening. Rectum obscured by beam hardening artifact. Lymphatic: There is no gastrohepatic or hepatoduodenal ligament lymphadenopathy. No retroperitoneal or mesenteric lymphadenopathy. No pelvic sidewall lymphadenopathy. Reproductive: Prostate gland is obscured. Other: No intraperitoneal free fluid. Musculoskeletal: Right total hip replacement. No worrisome lytic or sclerotic osseous abnormality. Review of the MIP images confirms the above findings. IMPRESSION: 1. No evidence for thoracoabdominal aortic aneurysm or dissection. 2. No evidence for large central pulmonary embolus back 3. No acute findings in the chest, abdomen, or pelvis. Specifically, no findings to explain the patient's history of chest and  back pain. 4. Emphysema (ICD10-J43.9). Electronically Signed   By: Misty Stanley M.D.   On: 11/08/2021 05:59    Cardiac Studies     Patient Profile     55 y.o. male with history of CAD s/p stenting of the OM in 2017, HTN, HLD, cocaine abuse, tobacco abuse admitted with chest pain.   Assessment & Plan    Chest pain/History of CAD: Admitted with chest pain. Troponin negative. No further chest pain. His chest pain was likely due to coronary vasospasm from cocaine use 12 hours before admission. Echo with akinesis of the anterolateral wall, unchanged from prior echo. LVEF=40-45%. No ischemic workup is indicated.  2.   HTN: BP controlled.  3.   Hyperlipidemia: Continue statin and Vascepa 4.   Hypokalemia: Replace potassium   Will d/c home today. Follow up in the office 2-3 weeks with Dr. Martinique  For questions or updates, please contact [place] Please consult [email or web address] for contact info under        Signed, Lauree Chandler, MD  11/09/2021, 7:54 AM

## 2021-11-09 NOTE — Progress Notes (Signed)
ANTICOAGULATION CONSULT NOTE Pharmacy Consult for Heparin Indication: chest pain/ACS  Allergies  Allergen Reactions   Peanut-Containing Drug Products Anaphylaxis and Swelling   Shellfish Allergy Anaphylaxis and Swelling   Other     IV Lesiscan- tht iritation with a myocardial perf study 09-13-2018    Patient Measurements: Height: 5\' 9"  (175.3 cm) Weight: 70.5 kg (155 lb 8 oz) IBW/kg (Calculated) : 70.7  Vital Signs: Temp: 98 F (36.7 C) (11/21 0306) Temp Source: Oral (11/21 0306) BP: 148/97 (11/21 0306) Pulse Rate: 79 (11/21 0306)  Labs: Recent Labs    11/08/21 0418 11/08/21 0424 11/08/21 0616 11/08/21 1400 11/08/21 1816 11/08/21 2152 11/09/21 0458  HGB 13.7 14.6  --   --  13.2  --  12.7*  HCT 42.7 43.0  --   --  42.0  --  39.5  PLT 342  --   --   --  275  --  291  HEPARINUNFRC  --   --   --  <0.10*  --  0.12* 0.20*  CREATININE 1.19 1.10  --   --  0.85  --   --   TROPONINIHS 13  --  15  --  15  --   --      Estimated Creatinine Clearance: 100.2 mL/min (by C-G formula based on SCr of 0.85 mg/dL).   Assessment: 55 y.o. male with chest pain for heparin. Heparin level subtherapeutic  Goal of Therapy:  Heparin level 0.3-0.7 units/ml Monitor platelets by anticoagulation protocol: Yes   Plan:  Heparin 2000 units IV bolus, then increase heparin 1500 units/hr Check heparin level in 6 hours.     Caryl Pina 11/09/2021,5:45 AM

## 2021-11-09 NOTE — Progress Notes (Signed)
Went over discharge instructions with the patient. The patient is made aware of the medication changes and the follow-up appointments. Patient PIV and tele monitor have been removed. CCMD has been notified. Transportation called.

## 2021-11-09 NOTE — Discharge Summary (Signed)
Discharge Summary    Patient ID: Kenneth Long MRN: [id number]; DOB: 1966-05-31  Admit date: 11/08/2021 Discharge date: 11/09/2021  PCP:  Mack Hook, MD   [facility] Providers Cardiologist:  Peter Martinique, MD     Discharge Diagnoses    Principal Problem:   Chest pain with moderate risk for cardiac etiology Active Problems:   Essential hypertension   Mixed hyperlipidemia   Polysubstance abuse ([place])   Diagnostic Studies/Procedures    N/a _____________   History of Present Illness     Kenneth Long is a 55 y.o. male with history of CAD s/p DES to OM, hypertension, hyperlipidemia, and polysubstance abuse  who was seen 11/08/2021 for the evaluation of chest pain at request of Dr. Sherry Ruffing.   History of inferior STEMI March 2017. Cardiac cath showed obstructive second OM s/p DES placement.  Otherwise no obstructive disease.  Echo showed LV function of 40 to 45% without wall motion abnormality.  He underwent right total hip replacement 05/3148 without complication.   Myoview September 2019 showing scar without evidence of ischemia.  EF stable at 45%.   He was doing relatively well and when last seen by Dr. Martinique March 2021.  Mr. Hoffmeister was at his friend's house the night prior to admission.  He felt fever and chills without checking his temperature.  Reported he was drinking beer and smoking tobacco and marijuana.  He declined using cocaine.  Michela Pitcher his friend was smoking cocaine.  However, his urine drug screen positive for cocaine.  After using this, he had sudden onset chest pain and felt diaphoretic and lethargic.  Took sublingual nitroglycerin without improvement.  His friend brought him to ER.  Upon arrival, he was lethargic, pale and diaphoretic.  He required sternal rub for response.  Patient was hypotensive and given fluid bolus.  EKG concerning for inferior STEMI. Case discussed with Dr. Saunders Revel, he did not meet STEMI criteria.  CT angio negative for  dissection or pulmonary embolism.  He was started on heparin drip after discussion with cardiologist.  Troponin has remained negative.  Alcohol level negative.  Respiratory panel negative for influenza and COVID.   Patient denied any chest pain or shortness of breath at the time of assessment.  Denied palpitation or melena.  He reported compliance with his medication.  No regular exercise due to arthritis.   Reports 2 days history of left lower extremity swelling which has improved. No orthopnea or PND.   He was admitted for further management.   Hospital Course     Chest pain/History of CAD: Admitted with chest pain. Troponin cycled and negative. No further chest pain. His chest pain was likely due to coronary vasospasm from cocaine use 12 hours before admission. Echo with akinesis of the anterolateral wall, which was unchanged from prior echo. LVEF=40-45%. No ischemic workup was felt indicated.  -- continue on ASA, statin, and norvasc   HTN: initially soft on admission but improved -- will stop coreg at discharge, continue on norvasc   Hyperlipidemia: LDL 64 -- Continue statin and Vascepa  Hypokalemia: supplemented prior DC   Polysubstance abuse: UDS + for cocaine -- cessation advised   Patient saw seen by Dr. Angelena Form and deemed stable for discharge home. Follow up in the office has been arranged.   Did the patient have an acute coronary syndrome (MI, NSTEMI, STEMI, etc) this admission?:  No  Did the patient have a percutaneous coronary intervention (stent / angioplasty)?:  No.   _____________  Discharge Vitals Blood pressure (!) 148/97, pulse 81, temperature (!) 97.5 F (36.4 C), temperature source Oral, resp. rate 16, height 5\' 9"  (1.753 m), weight 70.5 kg, SpO2 99 %.  Filed Weights   11/08/21 0406 11/08/21 1600 11/09/21 0306  Weight: 71 kg 70.8 kg 70.5 kg    Labs & Radiologic Studies    CBC Recent Labs    11/08/21 1816 11/09/21 0458  WBC  8.8 9.0  HGB 13.2 12.7*  HCT 42.0 39.5  MCV 94.8 94.0  PLT 275 671   Basic Metabolic Panel Recent Labs    11/08/21 0418 11/08/21 0424 11/08/21 1816 11/09/21 0458  NA 137 140  --  141  K 3.7 3.8  --  3.2*  CL 102 102  --  106  CO2 25  --   --  27  GLUCOSE 143* 139*  --  103*  BUN 8 9  --  5*  CREATININE 1.19 1.10 0.85 0.84  CALCIUM 9.1  --   --  9.0   Liver Function Tests Recent Labs    11/08/21 0418  AST 12*  ALT 8  ALKPHOS 60  BILITOT 0.6  PROT 7.2  ALBUMIN 3.6   No results for input(s): LIPASE, AMYLASE in the last 72 hours. High Sensitivity Troponin:   Recent Labs  Lab 11/08/21 0418 11/08/21 0616 11/08/21 1816  TROPONINIHS 13 15 15     BNP Invalid input(s): POCBNP D-Dimer No results for input(s): DDIMER in the last 72 hours. Hemoglobin A1C Recent Labs    11/08/21 1816  HGBA1C 4.3*   Fasting Lipid Panel Recent Labs    11/09/21 0458  CHOL 119  HDL 34*  LDLCALC 64  TRIG 106  CHOLHDL 3.5   Thyroid Function Tests No results for input(s): TSH, T4TOTAL, T3FREE, THYROIDAB in the last 72 hours.  Invalid input(s): FREET3 _____________  DG Chest Port 1 View  Result Date: 11/08/2021 CLINICAL DATA:  Chest pains and nausea. EXAM: PORTABLE CHEST 1 VIEW COMPARISON:  Portable chest 03/16/2016. FINDINGS: There are multiple overlying monitor wires. The heart is enlarged as before. No vascular congestion is seen. No pleural effusion is evident. The lungs clear of infiltrates. Moderate thoracic spondylosis. IMPRESSION: No evidence of acute chest disease.  Cardiomegaly. Electronically Signed   By: Telford Nab M.D.   On: 11/08/2021 04:33   ECHOCARDIOGRAM COMPLETE  Result Date: 11/08/2021    ECHOCARDIOGRAM REPORT   Patient Name:   [facility] Date of Exam: 11/08/2021 Medical Rec #:  [id number]            Height:       69.0 in Accession #:    [id number]           Weight:       156.5 lb Date of Birth:  Jan 06, 1966            BSA:          1.862 m Patient  Age:    55 years             BP:           122/83 mmHg Patient Gender: M                    HR:           74 bpm. Exam Location:  Inpatient Procedure: 2D Echo Indications:    chest  pain  History:        Patient has prior history of Echocardiogram examinations, most                 recent 03/17/2016. Cardiomyopathy, CAD; Risk Factors:Hypertension                 and Dyslipidemia.  Sonographer:    Johny Chess RDCS Referring Phys: [id number] [place]  1. Akinesis of the inferolateral wall with overall mild to moderate LV dysfunction.  2. Left ventricular ejection fraction, by estimation, is 40 to 45%. The left ventricle has mild to moderately decreased function. The left ventricle demonstrates regional wall motion abnormalities (see scoring diagram/findings for description). The left  ventricular internal cavity size was mildly dilated. There is mild left ventricular hypertrophy. Left ventricular diastolic parameters are consistent with Grade II diastolic dysfunction (pseudonormalization).  3. Right ventricular systolic function is normal. The right ventricular size is normal.  4. Left atrial size was moderately dilated.  5. The mitral valve is normal in structure. Mild mitral valve regurgitation. No evidence of mitral stenosis.  6. The aortic valve is normal in structure. Aortic valve regurgitation is not visualized. No aortic stenosis is present.  7. The inferior vena cava is normal in size with greater than 50% respiratory variability, suggesting right atrial pressure of 3 mmHg. FINDINGS  Left Ventricle: Left ventricular ejection fraction, by estimation, is 40 to 45%. The left ventricle has mild to moderately decreased function. The left ventricle demonstrates regional wall motion abnormalities. The left ventricular internal cavity size was mildly dilated. There is mild left ventricular hypertrophy. Left ventricular diastolic parameters are consistent with Grade II diastolic dysfunction  (pseudonormalization). Right Ventricle: The right ventricular size is normal. Right ventricular systolic function is normal. Left Atrium: Left atrial size was moderately dilated. Right Atrium: Right atrial size was normal in size. Pericardium: There is no evidence of pericardial effusion. Mitral Valve: The mitral valve is normal in structure. Mild mitral valve regurgitation. No evidence of mitral valve stenosis. Tricuspid Valve: The tricuspid valve is normal in structure. Tricuspid valve regurgitation is trivial. No evidence of tricuspid stenosis. Aortic Valve: The aortic valve is normal in structure. Aortic valve regurgitation is not visualized. No aortic stenosis is present. Pulmonic Valve: The pulmonic valve was not well visualized. Pulmonic valve regurgitation is not visualized. No evidence of pulmonic stenosis. Aorta: The aortic root is normal in size and structure. Venous: The inferior vena cava is normal in size with greater than 50% respiratory variability, suggesting right atrial pressure of 3 mmHg. IAS/Shunts: No atrial level shunt detected by color flow Doppler. Additional Comments: Akinesis of the inferolateral wall with overall mild to moderate LV dysfunction.  LEFT VENTRICLE PLAX 2D LVIDd:         5.40 cm      Diastology LVIDs:         4.40 cm      LV e' medial:    7.07 cm/s LV PW:         1.30 cm      LV E/e' medial:  12.9 LV IVS:        1.00 cm      LV e' lateral:   8.49 cm/s LVOT diam:     2.00 cm      LV E/e' lateral: 10.8 LV SV:         57 LV SV Index:   30 LVOT Area:     3.14 cm  LV Volumes (MOD)  LV vol d, MOD A2C: 96.7 ml LV vol d, MOD A4C: 105.0 ml LV vol s, MOD A2C: 57.7 ml LV vol s, MOD A4C: 49.2 ml LV SV MOD A2C:     39.0 ml LV SV MOD A4C:     105.0 ml LV SV MOD BP:      46.9 ml RIGHT VENTRICLE             IVC RV S prime:     12.50 cm/s  IVC diam: 1.80 cm TAPSE (M-mode): 2.0 cm LEFT ATRIUM           Index        RIGHT ATRIUM           Index LA diam:      4.40 cm 2.36 cm/m   RA Area:      13.90 cm LA Vol (A2C): 52.8 ml 28.36 ml/m  RA Volume:   33.00 ml  17.73 ml/m LA Vol (A4C): 75.1 ml 40.34 ml/m  AORTIC VALVE LVOT Vmax:   104.00 cm/s LVOT Vmean:  66.000 cm/s LVOT VTI:    0.180 m  AORTA Ao Root diam: 2.70 cm Ao Asc diam:  3.00 cm MITRAL VALVE MV Area (PHT): 5.38 cm    SHUNTS MV Decel Time: 141 msec    Systemic VTI:  0.18 m MV E velocity: 91.30 cm/s  Systemic Diam: 2.00 cm MV A velocity: 76.30 cm/s MV E/A ratio:  1.20 Kirk Ruths MD Electronically signed by Kirk Ruths MD Signature Date/Time: 11/08/2021/3:32:20 PM    Final    CT Angio Chest/Abd/Pel for Dissection W and/or Wo Contrast  Result Date: 11/08/2021 CLINICAL DATA:  Chest and back pain. EXAM: CT ANGIOGRAPHY CHEST, ABDOMEN AND PELVIS TECHNIQUE: Non-contrast CT of the chest was initially obtained. Multidetector CT imaging through the chest, abdomen and pelvis was performed using the standard protocol during bolus administration of intravenous contrast. Multiplanar reconstructed images and MIPs were obtained and reviewed to evaluate the vascular anatomy. CONTRAST:  110mL OMNIPAQUE IOHEXOL 350 MG/ML SOLN COMPARISON:  None. FINDINGS: CTA CHEST FINDINGS Cardiovascular: The heart size is normal. No substantial pericardial effusion. Coronary artery calcification is evident. No thoracic aortic aneurysm. Pre contrast imaging of the chest shows no hyperdense crescent in the wall of the thoracic aorta to suggest acute intramural hematoma. Postcontrast imaging shows no dissection flap in the thoracic aorta. Ascending thoracic aorta measures 3.0 cm diameter. No large central embolus in the pulmonary outflow tract, main pulmonary arteries, or lobar pulmonary arteries. Segmental and subsegmental pulmonary arteries are not reliably evaluated due to bolus timing. Mediastinum/Nodes: No mediastinal lymphadenopathy. 9 mm short axis right paratracheal lymph node is upper normal for size. There is no hilar lymphadenopathy. The esophagus has normal  imaging features. There is no axillary lymphadenopathy. Lungs/Pleura: Centrilobular and paraseptal emphysema evident. No suspicious pulmonary nodule or mass. No focal airspace consolidation. No pleural effusion. Musculoskeletal: No worrisome lytic or sclerotic osseous abnormality. Review of the MIP images confirms the above findings. CTA ABDOMEN AND PELVIS FINDINGS VASCULAR Aorta: Normal caliber aorta without aneurysm, dissection, vasculitis or significant stenosis. Celiac: Patent without evidence of aneurysm, dissection, vasculitis or significant stenosis. Replaced right hepatic artery. SMA: Patent without evidence of aneurysm, dissection, vasculitis or significant stenosis. Replaced right hepatic artery. Renals: Both renal arteries are patent without evidence of aneurysm, dissection, vasculitis, fibromuscular dysplasia or significant stenosis. Accessory right renal artery evident arising 7 cm distal to the main renal artery. IMA: Patent without evidence of aneurysm, dissection, vasculitis or significant  stenosis. Inflow: Patent without evidence of aneurysm, dissection, vasculitis or significant stenosis. Veins: No obvious venous abnormality within the limitations of this arterial phase study. Review of the MIP images confirms the above findings. NON-VASCULAR Hepatobiliary: No suspicious focal abnormality within the liver parenchyma. There is no evidence for gallstones, gallbladder wall thickening, or pericholecystic fluid. No intrahepatic or extrahepatic biliary dilation. Pancreas: No focal mass lesion. No dilatation of the main duct. No intraparenchymal cyst. No peripancreatic edema. Spleen: No splenomegaly. No focal mass lesion. Adrenals/Urinary Tract: No adrenal nodule or mass. Nodular thickening noted left adrenal gland. Kidneys unremarkable. No evidence for hydroureter. Bladder is obscured by beam hardening artifact from right hip replacement. Stomach/Bowel: Stomach is unremarkable. No gastric wall  thickening. No evidence of outlet obstruction. Duodenum is normally positioned as is the ligament of Treitz. No small bowel wall thickening. No small bowel dilatation. The terminal ileum is normal. The appendix is normal. No gross colonic mass. No colonic wall thickening. Rectum obscured by beam hardening artifact. Lymphatic: There is no gastrohepatic or hepatoduodenal ligament lymphadenopathy. No retroperitoneal or mesenteric lymphadenopathy. No pelvic sidewall lymphadenopathy. Reproductive: Prostate gland is obscured. Other: No intraperitoneal free fluid. Musculoskeletal: Right total hip replacement. No worrisome lytic or sclerotic osseous abnormality. Review of the MIP images confirms the above findings. IMPRESSION: 1. No evidence for thoracoabdominal aortic aneurysm or dissection. 2. No evidence for large central pulmonary embolus back 3. No acute findings in the chest, abdomen, or pelvis. Specifically, no findings to explain the patient's history of chest and back pain. 4. Emphysema (ICD10-J43.9). Electronically Signed   By: Misty Stanley M.D.   On: 11/08/2021 05:59   Disposition   Pt is being discharged home today in good condition.  Follow-up Plans & Appointments     Follow-up Information     Lendon Colonel, NP Follow up on 11/30/2021.   Specialties: Nurse Practitioner, Radiology, Cardiology Why: at 3:10pm for your follow up appt with Dr. Doug Sou NP Jeralyn Ruths information: [address] [place] [address] [phone]                Discharge Instructions     Call MD for:  difficulty breathing, headache or visual disturbances   Complete by: As directed    Call MD for:  persistant dizziness or light-headedness   Complete by: As directed    Call MD for:  redness, tenderness, or signs of infection (pain, swelling, redness, odor or green/yellow discharge around incision site)   Complete by: As directed    Diet - low sodium heart healthy   Complete by:  As directed    Increase activity slowly   Complete by: As directed        Discharge Medications   Allergies as of 11/09/2021       Reactions   Peanut-containing Drug Products Anaphylaxis, Swelling   Shellfish Allergy Anaphylaxis, Swelling   Other    IV Lesiscan- tht iritation with a myocardial perf study 09-13-2018        Medication List     STOP taking these medications    carvedilol 25 MG tablet Commonly known as: COREG   mometasone 50 MCG/ACT nasal spray Commonly known as: Nasonex       TAKE these medications    albuterol 108 (90 Base) MCG/ACT inhaler Commonly known as: VENTOLIN HFA Inhale 2 puffs into the lungs every 6 (six) hours as needed for wheezing or shortness of breath.   amLODipine 10 MG tablet Commonly known as: NORVASC  Take 1 tablet (10 mg total) by mouth daily.   aspirin EC 81 MG tablet Take 1 tablet (81 mg total) by mouth daily.   atorvastatin 80 MG tablet Commonly known as: LIPITOR Take 1 tablet (80 mg total) by mouth daily.   lisinopril-hydrochlorothiazide 20-25 MG tablet Commonly known as: ZESTORETIC 1 tab by mouth daily What changed:  how much to take how to take this when to take this additional instructions   loratadine 10 MG tablet Commonly known as: CLARITIN Take 1 tablet (10 mg total) by mouth daily.   nitroGLYCERIN 0.4 MG SL tablet Commonly known as: NITROSTAT Place 1 tablet (0.4 mg total) under the tongue every 5 (five) minutes as needed for chest pain.   olopatadine 0.1 % ophthalmic solution Commonly known as: Pataday Place 1 drop into both eyes 2 (two) times daily.   tamsulosin 0.4 MG Caps capsule Commonly known as: FLOMAX 1 cap by mouth at bedtime. What changed:  how much to take when to take this additional instructions   Vascepa 1 g capsule Generic drug: icosapent Ethyl Take 2 capsules by mouth twice daily           Outstanding Labs/Studies   N/a   Duration of Discharge Encounter   Greater  than 30 minutes including physician time.  Signed, Reino Bellis, NP 11/09/2021, 10:20 AM

## 2021-11-09 NOTE — Plan of Care (Signed)
  Problem: Clinical Measurements: Goal: Cardiovascular complication will be avoided Outcome: Progressing   Problem: Safety: Goal: Ability to remain free from injury will improve Outcome: Progressing   

## 2021-11-10 ENCOUNTER — Telehealth: Payer: Self-pay

## 2021-11-10 NOTE — Telephone Encounter (Signed)
Transition Care Management Follow-up Telephone Call Date of discharge and from where: 11/09/2021 from [facility] How have you been since you were released from the hospital? Pt stated that he is feeling much better. Pt did not have any questions or concerns at this time.  Any questions or concerns? No  Items Reviewed: Did the pt receive and understand the discharge instructions provided? Yes  Medications obtained and verified? Yes  Other? No  Any new allergies since your discharge? No  Dietary orders reviewed? No Do you have support at home? Yes   Functional Questionnaire: (I = Independent and D = Dependent) ADLs: I  Bathing/Dressing- I  Meal Prep- I  Eating- I  Maintaining continence- I  Transferring/Ambulation- I  Managing Meds- I   Follow up appointments reviewed:  PCP Hospital f/u appt confirmed? Yes  Scheduled to see Mack Hook, MD on 12/09/2021 @ 03:00pm. [facility] f/u appt confirmed? Yes  Scheduled to see Jory Sims, NP on 11/30/2021 @ 03:10pm. Are transportation arrangements needed? No  If their condition worsens, is the pt aware to call PCP or go to the Emergency Dept.? Yes Was the patient provided with contact information for the PCP's office or ED? Yes Was to pt encouraged to call back with questions or concerns? Yes

## 2021-11-29 NOTE — Progress Notes (Deleted)
Cardiology Office Note   Date:  11/29/2021   ID:  Kenneth Long, DOB 03/13/1966, MRN [id number]  PCP:  Mack Hook, MD  Cardiologist: Dr. Martinique  No chief complaint on file.    History of Present Illness: Kenneth Long is a 55 y.o. male who presents for ongoing assessment and management of CAD with history of drug-eluting stent to the OM, per cardiac catheterization in 2017, otherwise nonobstructive disease elsewhere.  Echocardiogram at that time revealed LVEF of 40% to 45% without wall motion abnormality.  He did have a Myoview in September 2019 showing scar without evidence of ischemia with a stable EF of 45%.  Other history includes hypertension, hyperlipidemia, polysubstance abuse who was seen by cardiology during recent hospitalization on 11/08/2021.  He was admitted on 11/08/2021 and discharged on 11/09/2021 after admission for sudden onset of chest pain.  He was at a friend's house and it also felt fever and chills.  He had been drinking beer smoking tobacco marijuana, and states that his friend was smoking cocaine.  UDS was positive for cocaine.  The patient had sudden diaphoresis and became lethargic, he took sublingual nitroglycerin without improvement and his friend brought him to the emergency room.  He required sternal rub for response.  He was found to be hypotensive and given a fluid bolus.  He did have an EKG that was concerning for inferior STEMI but he did not meet STEMI criteria.  He was ruled out for PE and aortic dissection.  Echocardiogram revealed LVEF of 40% to 45% was felt that his chest pain was likely due to coronary vasospasm in the setting of cocaine.  Carvedilol was discontinued but he was to continue taking amlodipine, statin, and Vascepa he was advised on smoking cessation and cocaine cessation.    Past Medical History:  Diagnosis Date   Allergy    SEASONAL   Arthritis    Asthma    Colon adenomas 08/07/2019   Dr. Ardis Hughs 5 Tubular adenomas  without high grade dysplasia.   Coronary artery disease    Dyspnea    "sometimes" with exertion    Headache    Heart murmur    Hyperlipidemia    Hypertension    Sleep apnea    no CPAP   ST elevation (STEMI) myocardial infarction involving left circumflex coronary artery ([place]) 03/16/2016   DES CFX   Umbilical hernia     Past Surgical History:  Procedure Laterality Date   CARDIAC CATHETERIZATION N/A 03/16/2016   Procedure: Left Heart Cath and Coronary Angiography;  Surgeon: Peter M Martinique, MD;  LAD OK, CFX 100%, OM1 20%, RCA 20%, EF nl   CARDIAC CATHETERIZATION N/A 03/16/2016   Procedure: Coronary Stent Intervention;  Surgeon: Peter M Martinique, MD; PROMUS PREM MR 3.0X16 mm DES    TOTAL HIP ARTHROPLASTY Right 08/24/2017   Procedure: RIGHT TOTAL HIP ARTHROPLASTY ANTERIOR APPROACH;  Surgeon: Leandrew Koyanagi, MD;  Location: [facility];  Service: Orthopedics;  Laterality: Right;  RIGHT TOTAL HIP ARTHROPLASTY ANTERIOR APPROACH   UMBILICAL HERNIA REPAIR  7782   umbilical hernia repair     Current Outpatient Medications  Medication Sig Dispense Refill   albuterol (VENTOLIN HFA) 108 (90 Base) MCG/ACT inhaler Inhale 2 puffs into the lungs every 6 (six) hours as needed for wheezing or shortness of breath. 18 g 0   amLODipine (NORVASC) 10 MG tablet Take 1 tablet (10 mg total) by mouth daily. 30 tablet 11   aspirin EC 81 MG tablet Take 1  tablet (81 mg total) by mouth daily. 90 tablet 3   atorvastatin (LIPITOR) 80 MG tablet Take 1 tablet (80 mg total) by mouth daily. 30 tablet 7   lisinopril-hydrochlorothiazide (ZESTORETIC) 20-25 MG tablet 1 tab by mouth daily (Patient taking differently: Take 1 tablet by mouth daily.) 30 tablet 11   loratadine (CLARITIN) 10 MG tablet Take 1 tablet (10 mg total) by mouth daily. 30 tablet 11   nitroGLYCERIN (NITROSTAT) 0.4 MG SL tablet Place 1 tablet (0.4 mg total) under the tongue every 5 (five) minutes as needed for chest pain. 25 tablet 12   olopatadine (PATADAY) 0.1 %  ophthalmic solution Place 1 drop into both eyes 2 (two) times daily. (Patient not taking: Reported on November 20, 2021) 5 mL 12   tamsulosin (FLOMAX) 0.4 MG CAPS capsule 1 cap by mouth at bedtime. (Patient taking differently: 0.4 mg at bedtime.) 30 capsule 11   VASCEPA 1 g capsule Take 2 capsules by mouth twice daily 120 capsule 10   No current facility-administered medications for this visit.    Allergies:   Peanut-containing drug products, Shellfish allergy, and Other    Social History:  The patient  reports that he quit smoking about 5 years ago. His smoking use included cigarettes. He has never used smokeless tobacco. He reports current alcohol use. He reports that he does not use drugs.   Family History:  The patient's family history includes Hypertension in his father, mother, sister, and sister; Sarcoidosis in his mother.    ROS: All other systems are reviewed and negative. Unless otherwise mentioned in H&P    PHYSICAL EXAM: VS:  There were no vitals taken for this visit. , BMI There is no height or weight on file to calculate BMI. GEN: Well nourished, well developed, in no acute distress HEENT: normal Neck: no JVD, carotid bruits, or masses Cardiac: ***RRR; no murmurs, rubs, or gallops,no edema  Respiratory:  Clear to auscultation bilaterally, normal work of breathing GI: soft, nontender, nondistended, + BS MS: no deformity or atrophy Skin: warm and dry, no rash Neuro:  Strength and sensation are intact Psych: euthymic mood, full affect   EKG:  EKG {ACTION; IS/IS MOQ:[id number]} ordered today. The ekg ordered today demonstrates ***   Recent Labs: Nov 20, 2021: ALT 8; B Natriuretic Peptide 47.7 11/09/2021: BUN 5; Creatinine, Ser 0.84; Hemoglobin 12.7; Platelets 291; Potassium 3.2; Sodium 141    Lipid Panel    Component Value Date/Time   CHOL 119 11/09/2021 0458   CHOL 113 10/06/2020 0907   TRIG 106 11/09/2021 0458   HDL 34 (L) 11/09/2021 0458   HDL 32 (L) 10/06/2020 0907    CHOLHDL 3.5 11/09/2021 0458   VLDL 21 11/09/2021 0458   LDLCALC 64 11/09/2021 0458   LDLCALC 6 10/06/2020 0907      Wt Readings from Last 3 Encounters:  11/09/21 155 lb 8 oz (70.5 kg)  09/22/20 156 lb (70.8 kg)  02/18/20 164 lb (74.4 kg)      Other studies Reviewed: Echocardiogram 11-20-21  1. Akinesis of the inferolateral wall with overall mild to moderate LV  dysfunction.   2. Left ventricular ejection fraction, by estimation, is 40 to 45%. The  left ventricle has mild to moderately decreased function. The left  ventricle demonstrates regional wall motion abnormalities (see scoring  diagram/findings for description). The left   ventricular internal cavity size was mildly dilated. There is mild left  ventricular hypertrophy. Left ventricular diastolic parameters are  consistent with Grade II diastolic dysfunction (pseudonormalization).  3. Right ventricular systolic function is normal. The right ventricular  size is normal.   4. Left atrial size was moderately dilated.   5. The mitral valve is normal in structure. Mild mitral valve  regurgitation. No evidence of mitral stenosis.   6. The aortic valve is normal in structure. Aortic valve regurgitation is  not visualized. No aortic stenosis is present.   7. The inferior vena cava is normal in size with greater than 50%  respiratory variability, suggesting right atrial pressure of 3 mmHg.   Left Heart Cath 03/16/2016 Conclusion  Prox RCA to Mid RCA lesion, 20% stenosed. 1st Mrg lesion, 20% stenosed. The left ventricular systolic function is normal. 2nd Mrg lesion, 100% stenosed. Post intervention, there is a 0% residual stenosis.   1. Single vessel occlusive CAD 2. Low normal LV function 3. Successful stenting of the second OM with a DES   Plan: DAPT for one year. Patient may be a candidate for fast track discharge if no complications.  ASSESSMENT AND PLAN:  1.  ***   Current medicines are reviewed at length with  the patient today.  I have spent *** dedicated to the care of this patient on the date of this encounter to include pre-visit review of records, assessment, management and diagnostic testing,with shared decision making.  Labs/ tests ordered today include: *** Phill Myron. West Pugh, ANP, AACC   11/29/2021 1:19 PM    [facility] [address] Office [phone] Fax [phone]  Notice: This dictation was prepared with Dragon dictation along with smaller phrase technology. Any transcriptional errors that result from this process are unintentional and may not be corrected upon review.

## 2021-11-30 ENCOUNTER — Ambulatory Visit: Payer: Medicaid Other | Admitting: Adult Health

## 2021-12-07 ENCOUNTER — Ambulatory Visit: Payer: Medicaid Other | Admitting: Internal Medicine

## 2021-12-09 ENCOUNTER — Ambulatory Visit: Payer: Medicaid Other | Admitting: Internal Medicine

## 2022-05-12 ENCOUNTER — Emergency Department (HOSPITAL_COMMUNITY): Payer: Medicaid Other

## 2022-05-12 ENCOUNTER — Observation Stay (HOSPITAL_COMMUNITY)
Admission: EM | Admit: 2022-05-12 | Discharge: 2022-05-13 | Disposition: A | Discharge status: Home / Self Care | Payer: Medicaid Other | Attending: Internal Medicine | Admitting: Internal Medicine

## 2022-05-12 ENCOUNTER — Encounter (HOSPITAL_COMMUNITY): Payer: Self-pay

## 2022-05-12 ENCOUNTER — Other Ambulatory Visit: Payer: Self-pay

## 2022-05-12 DIAGNOSIS — J189 Pneumonia, unspecified organism: Secondary | ICD-10-CM | POA: Diagnosis present

## 2022-05-12 DIAGNOSIS — F1490 Cocaine use, unspecified, uncomplicated: Secondary | ICD-10-CM | POA: Diagnosis present

## 2022-05-12 DIAGNOSIS — Z79899 Other long term (current) drug therapy: Secondary | ICD-10-CM | POA: Insufficient documentation

## 2022-05-12 DIAGNOSIS — J9601 Acute respiratory failure with hypoxia: Secondary | ICD-10-CM | POA: Diagnosis not present

## 2022-05-12 DIAGNOSIS — I5023 Acute on chronic systolic (congestive) heart failure: Secondary | ICD-10-CM

## 2022-05-12 DIAGNOSIS — R0602 Shortness of breath: Secondary | ICD-10-CM | POA: Diagnosis present

## 2022-05-12 DIAGNOSIS — F1920 Other psychoactive substance dependence, uncomplicated: Secondary | ICD-10-CM | POA: Diagnosis not present

## 2022-05-12 DIAGNOSIS — I110 Hypertensive heart disease with heart failure: Secondary | ICD-10-CM | POA: Diagnosis not present

## 2022-05-12 DIAGNOSIS — I252 Old myocardial infarction: Secondary | ICD-10-CM

## 2022-05-12 DIAGNOSIS — Z955 Presence of coronary angioplasty implant and graft: Secondary | ICD-10-CM

## 2022-05-12 DIAGNOSIS — Z7982 Long term (current) use of aspirin: Secondary | ICD-10-CM | POA: Diagnosis not present

## 2022-05-12 DIAGNOSIS — J90 Pleural effusion, not elsewhere classified: Secondary | ICD-10-CM | POA: Diagnosis not present

## 2022-05-12 DIAGNOSIS — I2510 Atherosclerotic heart disease of native coronary artery without angina pectoris: Secondary | ICD-10-CM | POA: Diagnosis present

## 2022-05-12 DIAGNOSIS — I5043 Acute on chronic combined systolic (congestive) and diastolic (congestive) heart failure: Secondary | ICD-10-CM | POA: Diagnosis not present

## 2022-05-12 DIAGNOSIS — F1910 Other psychoactive substance abuse, uncomplicated: Secondary | ICD-10-CM | POA: Diagnosis present

## 2022-05-12 DIAGNOSIS — F17210 Nicotine dependence, cigarettes, uncomplicated: Secondary | ICD-10-CM | POA: Diagnosis present

## 2022-05-12 DIAGNOSIS — R778 Other specified abnormalities of plasma proteins: Secondary | ICD-10-CM

## 2022-05-12 DIAGNOSIS — Z8249 Family history of ischemic heart disease and other diseases of the circulatory system: Secondary | ICD-10-CM

## 2022-05-12 DIAGNOSIS — Z91010 Allergy to peanuts: Secondary | ICD-10-CM

## 2022-05-12 DIAGNOSIS — J45909 Unspecified asthma, uncomplicated: Secondary | ICD-10-CM

## 2022-05-12 DIAGNOSIS — J4521 Mild intermittent asthma with (acute) exacerbation: Secondary | ICD-10-CM | POA: Diagnosis present

## 2022-05-12 DIAGNOSIS — Z20822 Contact with and (suspected) exposure to covid-19: Secondary | ICD-10-CM | POA: Diagnosis not present

## 2022-05-12 DIAGNOSIS — R059 Cough, unspecified: Secondary | ICD-10-CM | POA: Diagnosis not present

## 2022-05-12 DIAGNOSIS — J811 Chronic pulmonary edema: Secondary | ICD-10-CM | POA: Diagnosis not present

## 2022-05-12 DIAGNOSIS — A419 Sepsis, unspecified organism: Principal | ICD-10-CM | POA: Insufficient documentation

## 2022-05-12 DIAGNOSIS — I428 Other cardiomyopathies: Secondary | ICD-10-CM | POA: Diagnosis present

## 2022-05-12 DIAGNOSIS — I10 Essential (primary) hypertension: Secondary | ICD-10-CM | POA: Diagnosis present

## 2022-05-12 DIAGNOSIS — E782 Mixed hyperlipidemia: Secondary | ICD-10-CM | POA: Diagnosis not present

## 2022-05-12 DIAGNOSIS — I509 Heart failure, unspecified: Secondary | ICD-10-CM | POA: Diagnosis not present

## 2022-05-12 DIAGNOSIS — Z91013 Allergy to seafood: Secondary | ICD-10-CM

## 2022-05-12 DIAGNOSIS — I248 Other forms of acute ischemic heart disease: Secondary | ICD-10-CM | POA: Diagnosis present

## 2022-05-12 DIAGNOSIS — R7989 Other specified abnormal findings of blood chemistry: Secondary | ICD-10-CM

## 2022-05-12 DIAGNOSIS — G4733 Obstructive sleep apnea (adult) (pediatric): Secondary | ICD-10-CM | POA: Diagnosis present

## 2022-05-12 LAB — MAGNESIUM: Magnesium: 2 mg/dL (ref 1.7–2.4)

## 2022-05-12 LAB — RESPIRATORY PANEL BY PCR

## 2022-05-12 LAB — CBC WITH DIFFERENTIAL/PLATELET
Abs Immature Granulocytes: 0.03 10*3/uL (ref 0.00–0.07)
Basophils Absolute: 0.1 10*3/uL (ref 0.0–0.1)
Basophils Relative: 1 %
Eosinophils Absolute: 0.3 10*3/uL (ref 0.0–0.5)
Eosinophils Relative: 4 %
HCT: 44.2 % (ref 39.0–52.0)
Hemoglobin: 14 g/dL (ref 13.0–17.0)
Immature Granulocytes: 0 %
Lymphocytes Relative: 22 %
Lymphs Abs: 2 10*3/uL (ref 0.7–4.0)
MCH: 30.6 pg (ref 26.0–34.0)
MCHC: 31.7 g/dL (ref 30.0–36.0)
MCV: 96.5 fL (ref 80.0–100.0)
Monocytes Absolute: 0.6 10*3/uL (ref 0.1–1.0)
Monocytes Relative: 7 %
Neutro Abs: 5.8 10*3/uL (ref 1.7–7.7)
Neutrophils Relative %: 66 %
Platelets: 314 10*3/uL (ref 150–400)
RBC: 4.58 MIL/uL (ref 4.22–5.81)
RDW: 13.3 % (ref 11.5–15.5)
WBC: 8.8 10*3/uL (ref 4.0–10.5)
nRBC: 0 % (ref 0.0–0.2)

## 2022-05-12 LAB — COMPREHENSIVE METABOLIC PANEL
ALT: 12 U/L (ref 0–44)
AST: 15 U/L (ref 15–41)
Albumin: 3.8 g/dL (ref 3.5–5.0)
Alkaline Phosphatase: 71 U/L (ref 38–126)
Anion gap: 7 (ref 5–15)
BUN: 13 mg/dL (ref 6–20)
CO2: 22 mmol/L (ref 22–32)
Calcium: 9.5 mg/dL (ref 8.9–10.3)
Chloride: 110 mmol/L (ref 98–111)
Creatinine, Ser: 0.88 mg/dL (ref 0.61–1.24)
GFR, Estimated: >60 mL/min (ref >60)
Glucose, Bld: 136 mg/dL — ABNORMAL HIGH (ref 70–99)
Potassium: 3.5 mmol/L (ref 3.5–5.1)
Sodium: 139 mmol/L (ref 135–145)
Total Bilirubin: 0.4 mg/dL (ref 0.3–1.2)
Total Protein: 7.4 g/dL (ref 6.5–8.1)

## 2022-05-12 LAB — RESP PANEL BY RT-PCR (FLU A&B, COVID) ARPGX2
Influenza A by PCR: NEGATIVE
Influenza B by PCR: NEGATIVE
SARS Coronavirus 2 by RT PCR: NEGATIVE

## 2022-05-12 LAB — BRAIN NATRIURETIC PEPTIDE: B Natriuretic Peptide: 185.6 pg/mL — ABNORMAL HIGH (ref 0.0–100.0)

## 2022-05-12 LAB — LACTIC ACID, PLASMA
Lactic Acid, Venous: 1.5 mmol/L (ref 0.5–1.9)
Lactic Acid, Venous: 1.5 mmol/L (ref 0.5–1.9)

## 2022-05-12 LAB — PROCALCITONIN: Procalcitonin: <0.1 ng/mL

## 2022-05-12 LAB — TROPONIN I (HIGH SENSITIVITY)
Troponin I (High Sensitivity): 29 ng/L — ABNORMAL HIGH (ref <18)
Troponin I (High Sensitivity): 24 ng/L — ABNORMAL HIGH (ref <18)

## 2022-05-12 MED ORDER — ATORVASTATIN CALCIUM 80 MG PO TABS
80.0000 mg | ORAL_TABLET | Freq: Every day | ORAL | Status: DC
  Administered 2022-05-12 – 2022-05-13 (×2): 80 mg via ORAL
  Filled 2022-05-12 (×2): qty 1

## 2022-05-12 MED ORDER — SODIUM CHLORIDE 0.9% FLUSH: 3.0000 mL | INTRAVENOUS | Status: DC | PRN

## 2022-05-12 MED ORDER — IOHEXOL 350 MG/ML SOLN
80.0000 mL | Freq: Once | INTRAVENOUS | Status: AC | PRN
  Administered 2022-05-12: 80 mL via INTRAVENOUS

## 2022-05-12 MED ORDER — SODIUM CHLORIDE 0.9 % IV SOLN
500.0000 mg | INTRAVENOUS | Status: DC
  Filled 2022-05-12: qty 5

## 2022-05-12 MED ORDER — SODIUM CHLORIDE 0.9% FLUSH
3.0000 mL | Freq: Two times a day (BID) | INTRAVENOUS | Status: DC
  Administered 2022-05-12 – 2022-05-13 (×3): 3 mL via INTRAVENOUS

## 2022-05-12 MED ORDER — SODIUM CHLORIDE 0.9 % IV SOLN
1.0000 g | INTRAVENOUS | Status: DC
  Administered 2022-05-13: 1 g via INTRAVENOUS
  Filled 2022-05-12: qty 10

## 2022-05-12 MED ORDER — ASPIRIN 81 MG PO CHEW
324.0000 mg | CHEWABLE_TABLET | Freq: Once | ORAL | Status: AC
  Administered 2022-05-12: 324 mg via ORAL
  Filled 2022-05-12: qty 4

## 2022-05-12 MED ORDER — POTASSIUM CHLORIDE CRYS ER 20 MEQ PO TBCR
40.0000 meq | EXTENDED_RELEASE_TABLET | Freq: Once | ORAL | Status: AC
Start: 2022-05-12 — End: 2022-05-12
  Administered 2022-05-12: 40 meq via ORAL
  Filled 2022-05-12: qty 2

## 2022-05-12 MED ORDER — ASPIRIN 81 MG PO TBEC
81.0000 mg | DELAYED_RELEASE_TABLET | Freq: Every day | ORAL | Status: DC
  Administered 2022-05-12 – 2022-05-13 (×2): 81 mg via ORAL
  Filled 2022-05-12 (×2): qty 1

## 2022-05-12 MED ORDER — GUAIFENESIN-DM 100-10 MG/5ML PO SYRP: 5.0000 mL | ORAL_SOLUTION | ORAL | Status: DC | PRN

## 2022-05-12 MED ORDER — SODIUM CHLORIDE 0.9 % IV SOLN
500.0000 mg | Freq: Once | INTRAVENOUS | Status: AC
  Administered 2022-05-12: 500 mg via INTRAVENOUS
  Filled 2022-05-12: qty 5

## 2022-05-12 MED ORDER — FUROSEMIDE 10 MG/ML IJ SOLN
20.0000 mg | Freq: Once | INTRAMUSCULAR | Status: AC
  Administered 2022-05-12: 20 mg via INTRAVENOUS
  Filled 2022-05-12: qty 2

## 2022-05-12 MED ORDER — ACETAMINOPHEN 650 MG RE SUPP: 650.0000 mg | Freq: Four times a day (QID) | RECTAL | Status: DC | PRN

## 2022-05-12 MED ORDER — LACTATED RINGERS IV SOLN: INTRAVENOUS | Status: DC

## 2022-05-12 MED ORDER — FENTANYL CITRATE PF 50 MCG/ML IJ SOSY
100.0000 ug | PREFILLED_SYRINGE | Freq: Once | INTRAMUSCULAR | Status: AC
  Administered 2022-05-12: 100 ug via INTRAVENOUS
  Filled 2022-05-12: qty 2

## 2022-05-12 MED ORDER — ACETAMINOPHEN 325 MG PO TABS: 650.0000 mg | ORAL_TABLET | Freq: Four times a day (QID) | ORAL | Status: DC | PRN

## 2022-05-12 MED ORDER — METHYLPREDNISOLONE SODIUM SUCC 125 MG IJ SOLR
125.0000 mg | Freq: Once | INTRAMUSCULAR | Status: AC
  Administered 2022-05-12: 125 mg via INTRAVENOUS
  Filled 2022-05-12: qty 2

## 2022-05-12 MED ORDER — IPRATROPIUM-ALBUTEROL 0.5-2.5 (3) MG/3ML IN SOLN
3.0000 mL | Freq: Four times a day (QID) | RESPIRATORY_TRACT | Status: AC
  Administered 2022-05-12 – 2022-05-13 (×3): 3 mL via RESPIRATORY_TRACT
  Filled 2022-05-12 (×3): qty 3

## 2022-05-12 MED ORDER — ALBUTEROL SULFATE (2.5 MG/3ML) 0.083% IN NEBU: 2.5000 mg | INHALATION_SOLUTION | RESPIRATORY_TRACT | Status: DC | PRN

## 2022-05-12 MED ORDER — SODIUM CHLORIDE 0.9 % IV SOLN
1.0000 g | Freq: Once | INTRAVENOUS | Status: AC
  Administered 2022-05-12: 1 g via INTRAVENOUS
  Filled 2022-05-12: qty 10

## 2022-05-12 MED ORDER — SODIUM CHLORIDE 0.9 % IV SOLN: 250.0000 mL | INTRAVENOUS | Status: DC | PRN

## 2022-05-12 MED ORDER — ALBUTEROL SULFATE (2.5 MG/3ML) 0.083% IN NEBU
2.5000 mg | INHALATION_SOLUTION | Freq: Once | RESPIRATORY_TRACT | Status: AC
  Administered 2022-05-12: 2.5 mg via RESPIRATORY_TRACT
  Filled 2022-05-12: qty 3

## 2022-05-12 MED ORDER — ENOXAPARIN SODIUM 40 MG/0.4ML IJ SOSY
40.0000 mg | PREFILLED_SYRINGE | INTRAMUSCULAR | Status: DC
  Administered 2022-05-12: 40 mg via SUBCUTANEOUS
  Filled 2022-05-12: qty 0.4

## 2022-05-12 NOTE — Assessment & Plan Note (Signed)
Continue lipitor  ?

## 2022-05-12 NOTE — Assessment & Plan Note (Addendum)
Blood pressure has been decently well controlled  He has been off all blood pressure medication since he ran out of medicine Avoid norvasc until EF results and avoid beta blocker with cocaine use Will continue to monitor pressures as he may only need one agent added back

## 2022-05-12 NOTE — Assessment & Plan Note (Addendum)
2 day history of progressive shortness of breath, hypoxia and orthopnea in setting of cocaine use -pulmonary vascular congestion on CXR/pulmonary edema, elevated BNP and +JVD -last echo in 10/2021: EF of 40-45% with LVF mild to moderately decreased. Akinesis of the inferolateral wall with overall mild to moderate LV dysfunction. Grade 2 DD -repeat echo pending -strict I/O -daily weights -given '20mg'$  lasix in ED and already put out over 1L with improvement in respiratory status and decreased oxygen demand. Not on lasix outpatient. Will given additional '20mg'$  today for '40mg'$  total and see response -adjust lasix per day team

## 2022-05-12 NOTE — Assessment & Plan Note (Signed)
UDS unable to be obtained; patient admitted to cocaine use 2 days ago Encouraged cessation

## 2022-05-12 NOTE — ED Notes (Signed)
Pt continues to be drowsy after meds given. Pt placed on 3L BNC. Oxygen dips to 88% while pts sleeping, pt arousable by verbal stimuli and oxygen rises to 92%. MD aware

## 2022-05-12 NOTE — ED Provider Notes (Signed)
[name]ENC[facility] DEPARTMENT Provider Note   CSN: [id number] Arrival date & time: 05/12/22  9326     History  Chief Complaint  Kenneth Long presents with   Shortness of Breath    Kenneth Long is a 56 y.o. male.   Shortness of Breath Associated symptoms: chest pain, cough and fever   Kenneth Long presenting for shortness of breath.  Medical history includes HTN, CAD, HLD, ischemic cardiomyopathy, polysubstance abuse, asthma, and arthritis.  He reports shortness of breath starting yesterday.  He describes subjective fevers over the past 2 days.  He has had a productive cough but has not observed any sputum.  Kenneth Long significant other reports that symptoms worsened early this morning, around 6 AM.  He has had substernal chest pain with worsened shortness of breath.  Per chart review, Kenneth Long underwent left heart cath in 2017.  At that time, he had stenosis of second OM and DES was placed.  He currently takes ASA but has not taken his dose today.  He states he was recently taken off of a different blood thinner.  Most recent echocardiogram was in November.  At that time, he had LVEF of 40 to 45% with grade 2 diastolic dysfunction.  Only diuretic medication is HCTZ.    Home Medications Prior to Admission medications   Medication Sig Start Date End Date Taking? Authorizing Provider  albuterol (VENTOLIN HFA) 108 (90 Base) MCG/ACT inhaler Inhale 2 puffs into the lungs every 6 (six) hours as needed for wheezing or shortness of breath. 01/30/20   Mack Hook, MD  amLODipine (NORVASC) 10 MG tablet Take 1 tablet (10 mg total) by mouth daily. 05/12/20   Mack Hook, MD  aspirin EC 81 MG tablet Take 1 tablet (81 mg total) by mouth daily. 12/26/17   Martin[place], Peter M, MD  atorvastatin (LIPITOR) 80 MG tablet Take 1 tablet (80 mg total) by mouth daily. 05/12/20   Mack Hook, MD  lisinopril-hydrochlorothiazide (ZESTORETIC) 20-25 MG tablet 1 tab by mouth  daily Kenneth Long taking differently: Take 1 tablet by mouth daily. 05/12/20   Mack Hook, MD  loratadine (CLARITIN) 10 MG tablet Take 1 tablet (10 mg total) by mouth daily. 09/22/20   Mack Hook, MD  nitroGLYCERIN (NITROSTAT) 0.4 MG SL tablet Place 1 tablet (0.4 mg total) under the tongue every 5 (five) minutes as needed for chest pain. 02/06/19   Mack Hook, MD  olopatadine (PATADAY) 0.1 % ophthalmic solution Place 1 drop into both eyes 2 (two) times daily. Kenneth Long not taking: Reported on 11/08/2021 09/22/20   Mack Hook, MD  tamsulosin (FLOMAX) 0.4 MG CAPS capsule 1 cap by mouth at bedtime. Kenneth Long taking differently: 0.4 mg at bedtime. 05/12/20   Mack Hook, MD  VASCEPA 1 g capsule Take 2 capsules by mouth twice daily 10/30/20   Mack Hook, MD      Allergies    Peanut-containing drug products, Shellfish allergy, and Other    Review of Systems   Review of Systems  Constitutional:  Positive for fever.  Respiratory:  Positive for cough, chest tightness and shortness of breath.   Cardiovascular:  Positive for chest pain.  All other systems reviewed and are negative.  Physical Exam Updated Vital Signs BP (!) 138/92   Pulse 79   Temp (!) 97.5 F (36.4 C) (Oral)   Resp 16   Ht '5\' 9"'$  (1.753 Long)   Wt 74.8 kg   SpO2 96%   BMI 24.37 kg/Long  Physical Exam Vitals and nursing  note reviewed.  Constitutional:      General: He is not in acute distress.    Appearance: He is well-developed and normal weight. He is ill-appearing and diaphoretic. He is not toxic-appearing.  HENT:     Head: Normocephalic and atraumatic.     Mouth/Throat:     Mouth: Mucous membranes are moist.     Pharynx: Oropharynx is clear.  Eyes:     Conjunctiva/sclera: Conjunctivae normal.  Cardiovascular:     Rate and Rhythm: Normal rate and regular rhythm.     Heart sounds: No murmur heard. Pulmonary:     Effort: Pulmonary effort is normal. Tachypnea present. No accessory  muscle usage or respiratory distress.     Breath sounds: Wheezing present. No decreased breath sounds, rhonchi or rales.  Chest:     Chest wall: Tenderness present.  Abdominal:     Palpations: Abdomen is soft.     Tenderness: There is no abdominal tenderness.  Musculoskeletal:        General: No swelling. Normal range of motion.     Cervical back: Normal range of motion and neck supple.     Right lower leg: No edema.     Left lower leg: No edema.  Skin:    General: Skin is warm.     Capillary Refill: Capillary refill takes less than 2 seconds.     Coloration: Skin is not cyanotic or pale.  Neurological:     General: No focal deficit present.     Mental Status: He is alert and oriented to person, place, and time.     Cranial Nerves: No cranial nerve deficit.     Motor: No weakness.  Psychiatric:        Mood and Affect: Mood normal.        Behavior: Behavior normal.    ED Results / Procedures / Treatments   Labs (all labs ordered are listed, but only abnormal results are displayed) Labs Reviewed  COMPREHENSIVE METABOLIC PANEL - Abnormal; Notable for the following components:      Result Value   Glucose, Bld 136 (*)    All other components within normal limits  BRAIN NATRIURETIC PEPTIDE - Abnormal; Notable for the following components:   B Natriuretic Peptide 185.6 (*)    All other components within normal limits  TROPONIN I (HIGH SENSITIVITY) - Abnormal; Notable for the following components:   Troponin I (High Sensitivity) 29 (*)    All other components within normal limits  TROPONIN I (HIGH SENSITIVITY) - Abnormal; Notable for the following components:   Troponin I (High Sensitivity) 24 (*)    All other components within normal limits  RESP PANEL BY RT-PCR (FLU A&B, COVID) ARPGX2  RESPIRATORY PANEL BY PCR  CULTURE, BLOOD (ROUTINE X 2)  CULTURE, BLOOD (ROUTINE X 2)  CBC WITH DIFFERENTIAL/PLATELET  MAGNESIUM  LACTIC ACID, PLASMA  RAPID URINE DRUG SCREEN, HOSP PERFORMED   PROCALCITONIN  LACTIC ACID, PLASMA  LEGIONELLA PNEUMOPHILA SEROGP 1 UR AG  STREP PNEUMONIAE URINARY ANTIGEN    EKG EKG Interpretation  Date/Time:  Wednesday May 12 2022 08:47:20 EDT Ventricular Rate:  91 PR Interval:  148 QRS Duration: 99 QT Interval:  356 QTC Calculation: 438 R Axis:   9 Text Interpretation: Sinus rhythm Biatrial enlargement RSR' in V1 or V2, right VCD or RVH Left ventricular hypertrophy Borderline T abnormalities, lateral leads Confirmed by Godfrey Pick ([id number]) on 05/12/2022 10:31:16 AM  Radiology CT Angio Chest PE W and/or Wo Contrast  Result Date:  05/12/2022 CLINICAL DATA:  Pulmonary embolism (PE) suspected, high prob EXAM: CT ANGIOGRAPHY CHEST WITH CONTRAST TECHNIQUE: Multidetector CT imaging of the chest was performed using the standard protocol during bolus administration of intravenous contrast. Multiplanar CT image reconstructions and MIPs were obtained to evaluate the vascular anatomy. RADIATION DOSE REDUCTION: This exam was performed according to the departmental dose-optimization program which includes automated exposure control, adjustment of the mA and/or kV according to Kenneth Long size and/or use of iterative reconstruction technique. CONTRAST:  89m OMNIPAQUE IOHEXOL 350 MG/ML SOLN COMPARISON:  November 2022 FINDINGS: Cardiovascular: Satisfactory opacification of the pulmonary arteries to the segmental level. No evidence of pulmonary embolism. Cardiomegaly. No pericardial effusion. Coronary artery calcification. Thoracic aorta is normal in caliber. Mediastinum/Nodes: Mildly prominent mediastinal lymph nodes are probably reactive. Included thyroid is unremarkable. Lungs/Pleura: Patchy ground-glass and consolidative opacities. Interlobular septal thickening primarily in the left greater than right upper lobes. Small right and trace left pleural effusions. No pneumothorax. Upper Abdomen: No acute abnormality. Musculoskeletal: No acute osseous abnormality. Review of the  MIP images confirms the above findings. IMPRESSION: No evidence of acute pulmonary embolism. Bilateral pulmonary opacities. Small right and trace left pleural effusions. Cardiomegaly. Pulmonary edema is favored but pneumonia is also possible in the appropriate setting. Electronically Signed   By: PMacy MisM.D.   On: 05/12/2022 11:23   DG Chest Port 1 View  Result Date: 05/12/2022 CLINICAL DATA:  Provided history: Cough. Additional history provided: Shortness of breath, cough, history of asthma, possible fever. EXAM: PORTABLE CHEST 1 VIEW COMPARISON:  CT angiogram chest 11/08/2021. Prior chest radiographs 11/08/2021 and earlier. FINDINGS: Cardiomegaly with central pulmonary vascular congestion. Interstitial and ill-defined airspace opacities throughout both lungs. No evidence of pleural effusion or pneumothorax. No acute bony abnormality identified. IMPRESSION: Cardiomegaly with central pulmonary vascular congestion. Interstitial and ill-defined airspace opacities throughout both lungs. This may reflect pulmonary edema. However, atypical/viral pneumonia can also have this radiographic appearance and clinical correlation is recommended. Electronically Signed   By: KKellie SimmeringD.O.   On: 05/12/2022 09:17    Procedures Procedures    Medications Ordered in ED Medications  azithromycin (ZITHROMAX) 500 mg in sodium chloride 0.9 % 250 mL IVPB (500 mg Intravenous New Bag/Given 05/12/22 1500)  cefTRIAXone (ROCEPHIN) 1 g in sodium chloride 0.9 % 100 mL IVPB (has no administration in time range)  enoxaparin (LOVENOX) injection 40 mg (has no administration in time range)  sodium chloride flush (NS) 0.9 % injection 3 mL (3 mLs Intravenous Given 05/12/22 1556)  sodium chloride flush (NS) 0.9 % injection 3 mL (has no administration in time range)  0.9 %  sodium chloride infusion (has no administration in time range)  acetaminophen (TYLENOL) tablet 650 mg (has no administration in time range)    Or   acetaminophen (TYLENOL) suppository 650 mg (has no administration in time range)  albuterol (PROVENTIL) (2.5 MG/3ML) 0.083% nebulizer solution 2.5 mg (has no administration in time range)  guaiFENesin-dextromethorphan (ROBITUSSIN DM) 100-10 MG/5ML syrup 5 mL (has no administration in time range)  ipratropium-albuterol (DUONEB) 0.5-2.5 (3) MG/3ML nebulizer solution 3 mL (3 mLs Nebulization Given 05/12/22 1555)  furosemide (LASIX) injection 20 mg (has no administration in time range)  azithromycin (ZITHROMAX) 500 mg in sodium chloride 0.9 % 250 mL IVPB (has no administration in time range)  aspirin chewable tablet 324 mg (324 mg Oral Given 05/12/22 0905)  albuterol (PROVENTIL) (2.5 MG/3ML) 0.083% nebulizer solution 2.5 mg (2.5 mg Nebulization Given 05/12/22 0906)  methylPREDNISolone sodium succinate (SOLU-MEDROL) 125  mg/2 mL injection 125 mg (125 mg Intravenous Given 05/12/22 0905)  fentaNYL (SUBLIMAZE) injection 100 mcg (100 mcg Intravenous Given 05/12/22 0907)  iohexol (OMNIPAQUE) 350 MG/ML injection 80 mL (80 mLs Intravenous Contrast Given 05/12/22 1111)  albuterol (PROVENTIL) (2.5 MG/3ML) 0.083% nebulizer solution 2.5 mg (2.5 mg Nebulization Given 05/12/22 1141)  potassium chloride SA (KLOR-CON Long) CR tablet 40 mEq (40 mEq Oral Given 05/12/22 1347)  furosemide (LASIX) injection 20 mg (20 mg Intravenous Given 05/12/22 1137)  cefTRIAXone (ROCEPHIN) 1 g in sodium chloride 0.9 % 100 mL IVPB (0 g Intravenous Stopped 05/12/22 1447)    ED Course/ Medical Decision Making/ A&P                           Medical Decision Making Amount and/or Complexity of Data Reviewed Labs: ordered. Radiology: ordered.  Risk OTC drugs. Prescription drug management. Decision regarding hospitalization.   This Kenneth Long presents to the ED for concern of chest pain or shortness of breath, this involves an extensive number of treatment options, and is a complaint that carries with it a high risk of complications and  morbidity.  The differential diagnosis includes pneumonia, asthma exacerbation, CHF exacerbation, CAD, PE   Co morbidities that complicate the Kenneth Long evaluation  HTN, CAD, HLD, ischemic cardiomyopathy, polysubstance abuse, asthma, and arthritis   Additional history obtained:  Additional history obtained from Kenneth Long's significant other External records from outside source obtained and reviewed including EMR   Lab Tests:  I Ordered, and personally interpreted labs.  The pertinent results include: Normal hemoglobin, no leukocytosis, normal electrolytes, elevated BNP, slight elevation in troponin that is stable on repeat   Imaging Studies ordered:  I ordered imaging studies including chest x-ray, CTA chest I independently visualized and interpreted imaging which showed bilateral pulmonary opacities, consistent with pulmonary edema and/or multifocal pneumonia I agree with the radiologist interpretation   Cardiac Monitoring: / EKG:  The Kenneth Long was maintained on a cardiac monitor.  I personally viewed and interpreted the cardiac monitored which showed an underlying rhythm of: Sinus rhythm  Problem List / ED Course / Critical interventions / Medication management  Kenneth Long is a 56 year old male presenting for chest pain and shortness of breath.  He states that he has had subjective fevers over the past several days.  He has also had what he describes as a productive cough, however, he has not been able to identify characterization of the sputum.  Yesterday, he endorses worsening shortness of breath.  This morning, he experienced worsening severity of dyspnea as well as chest pain.  On arrival in the ED, he is ill-appearing and mildly diaphoretic.  EKG was obtained which did not show evidence of STEMI.  Kenneth Long was given 324 of ASA and 100 of fentanyl for chest pain.  On lung auscultation, he does have expiratory wheeze.  Kenneth Long was treated for asthma exacerbation with Solu-Medrol and  nebulized albuterol.  He did endorse improved shortness of breath following breathing treatment.  On Kenneth Long's lab work, BNP is elevated.  Kenneth Long was given potassium chloride to optimize potassium prior to Lasix.  Although Kenneth Long had diminished LVEF on most recent echocardiogram, he is not currently prescribed Lasix.  20 mg of IV Lasix was given for diuresis.  On chest imaging studies, there is no evidence of PE, however, there is evidence of bilateral pulmonary opacities consistent with pulmonary edema and/or multifocal pneumonia.  Given his recent history of productive cough and fevers, Kenneth Long was treated  with antibiotics for presumed pneumonia.  When ambulating to the bathroom, Kenneth Long experienced severe dyspnea, pallor, and diaphoresis.  At this point, he is also found to have hypoxia.  Kenneth Long was placed on supplemental oxygen.  Given his severe symptoms and hypoxia, Kenneth Long was admitted to hospitalist for further management. I ordered medication including ASA and fentanyl for chest pain; Solu-Medrol and albuterol for wheezing; Lasix for diuresis; potassium chloride for optimization of electrolytes; ceftriaxone and azithromycin for treatment of multifocal pneumonia Reevaluation of the Kenneth Long after these medicines showed that the Kenneth Long improved I have reviewed the patients home medicines and have made adjustments as needed   Social Determinants of Health:  Has PCP.         Final Clinical Impression(s) / ED Diagnoses Final diagnoses:  Acute respiratory failure with hypoxia (HCC)  Mild intermittent asthma with exacerbation  Community acquired pneumonia, unspecified laterality  Acute on chronic systolic congestive heart failure [facility])    Rx / DC Orders ED Discharge Orders     None         Godfrey Pick, MD 05/12/22 1600

## 2022-05-12 NOTE — ED Notes (Signed)
RN notified RT to assess pt.

## 2022-05-12 NOTE — ED Notes (Signed)
Back from ct.

## 2022-05-12 NOTE — ED Notes (Addendum)
Pt stated SOB worsening after family member unhooked pt to go to restroom. RN advised pt at this time using urinal  and for family to not touch equipment.   Pt has labored breathing, diaphoretic tachypnea. MD notified

## 2022-05-12 NOTE — Assessment & Plan Note (Addendum)
56 year old male presenting with 2 day history of progressively worsening shortness of breath, cough and subjective fevers found to be hypoxic to 87-88% on room air and requiring 4L [place] Oxygen -admit to telemetry -multifactorial causes including acute on chronic combined CHF exacerbation, possible sepsis with pneumonia and asthma and cocaine use  -oxygen demand trending downward after high output from lasix and feels better, continue to treat CHF exacerbation -will cover him for pneumonia, check pct/RVP; however, clinically more suggestive of CHF -had wheezing on arrival and received steroids/nebs; however, he has no wheezing on exam. Will continue with breathing treatments for now -wean oxygen as tolerated.

## 2022-05-12 NOTE — Assessment & Plan Note (Signed)
S/p DES to OM, no chest pain reported to me, troponin not consistent with ACS Continue telemetry and medical management ASA, statin

## 2022-05-12 NOTE — ED Notes (Signed)
RT at bedside.

## 2022-05-12 NOTE — Assessment & Plan Note (Addendum)
History of shortness of breath, productive cough and subjective fevers at home, none recorded here and findings on CXR that could be pneumonia related.  Sepsis criteria met with HR/RR.  Will continue Rocephin and azithromycin Check urinary antigens BC pending and PCT pending Check RVP Robitussin DM prn

## 2022-05-12 NOTE — ED Triage Notes (Signed)
POV. Pt reports SOB/cough starting yesterday. Pt hx of asthma has not used home meds. Pt states productive cough with possible fever.

## 2022-05-12 NOTE — ED Notes (Signed)
Pt resting easy at this time. Unlabored RR.

## 2022-05-12 NOTE — Assessment & Plan Note (Signed)
Flat/downtrending in setting of cocaine use and acute respiratory failure. Likely demand ischemia Not consistent with ACS

## 2022-05-12 NOTE — H&P (Signed)
History and Physical    Patient: Kenneth Long YTK:[id number] DOB: 12/20/1966 DOA: 05/12/2022 DOS: the patient was seen and examined on 05/12/2022 PCP: Mack Hook, MD  Patient coming from: Home - lives alone    Chief Complaint: shortness of breath   HPI: Kenneth Long is a 56 y.o. male with medical history significant of CAD, CHF, asthma, HLD, HTN, systolic and diastolic CHF, OSA who reports productive cough, shortness of breath, subjective fevers at home that started yesterday.  He states the shortness of breath progressed today so he came to ED. He states he has problems breathing lying flat and thinks he may have gained a few pounds. No leg swelling. He denies any sick contacts. He told EDP he had chest pain, but denies this to me.   +subjective fever/chills, denies any vision changes/headaches, chest pain or palpitations, abdominal pain, N/V/D, dysuria or leg swelling.    Smokes occasionally, drinks but not on a daily basis. Did use coke 2 days ago.   ER Course:  vitals: afebrile, bp: 142/99, HR: 95, RR: 20, oxygen: 91%RA Pertinent labs: bnp: 185, troponin 29>24 CXR: cardiomegaly with central pulmonary vascular congestion Interstitial and ill-defined airspace opacities throughout both lungs. This may reflect pulmonary edema. However, atypical/viral pneumonia can also have this radiographic appearance and clinical correlation is recommended. CTA chest: no PE, bilateral pulmonary opacities. Small right and trace left pleural effusions. Cardiomegaly. Pulmonary edema favored, but pneumonia also possible In ED: given ASA, rocephin/azithromycin, lasix 72m, solumedrol and potassium.    Review of Systems: As mentioned in the history of present illness. All other systems reviewed and are negative. Past Medical History:  Diagnosis Date   Allergy    SEASONAL   Arthritis    Asthma    Colon adenomas 08/07/2019   Dr. JArdis Hughs5 Tubular adenomas without high grade  dysplasia.   Coronary artery disease    Dyspnea    "sometimes" with exertion    Headache    Heart murmur    Hyperlipidemia    Hypertension    Sleep apnea    no CPAP   ST elevation (STEMI) myocardial infarction involving left circumflex coronary artery ([facility] 03/16/2016   DES CFX   Umbilical hernia    Past Surgical History:  Procedure Laterality Date   CARDIAC CATHETERIZATION N/A 03/16/2016   Procedure: Left Heart Cath and Coronary Angiography;  Surgeon: Peter M JMartinique MD;  LAD OK, CFX 100%, OM1 20%, RCA 20%, EF nl   CARDIAC CATHETERIZATION N/A 03/16/2016   Procedure: Coronary Stent Intervention;  Surgeon: Peter M JMartinique MD; PROMUS PREM MR 3.0X16 mm DES    TOTAL HIP ARTHROPLASTY Right 08/24/2017   Procedure: RIGHT TOTAL HIP ARTHROPLASTY ANTERIOR APPROACH;  Surgeon: XLeandrew Koyanagi MD;  [facility] [facility]  Service: Orthopedics;  Laterality: Right;  RIGHT TOTAL HIP ARTHROPLASTY ANTERIOR APPROACH   UMBILICAL HERNIA REPAIR  27517  umbilical hernia repair   Social History:  reports that he quit smoking about 6 years ago. His smoking use included cigarettes. He has never used smokeless tobacco. He reports current alcohol use. He reports that he does not use drugs.  Allergies  Allergen Reactions   Peanut-Containing Drug Products Anaphylaxis and Swelling   Shellfish Allergy Anaphylaxis and Swelling   Other     IV Lesiscan- tht iritation with a myocardial perf study 09-13-2018    Family History  Problem Relation Age of Onset   Hypertension Mother    Sarcoidosis Mother    Hypertension Father  Hypertension Sister    Hypertension Sister    Colon cancer Neg Hx    Colon polyps Neg Hx    Esophageal cancer Neg Hx    Stomach cancer Neg Hx    Rectal cancer Neg Hx     Prior to Admission medications   Medication Sig Start Date End Date Taking? Authorizing Provider  albuterol (VENTOLIN HFA) 108 (90 Base) MCG/ACT inhaler Inhale 2 puffs into the lungs every 6 (six) hours as needed for wheezing  or shortness of breath. 01/30/20   Mack Hook, MD  amLODipine (NORVASC) 10 MG tablet Take 1 tablet (10 mg total) by mouth daily. 05/12/20   Mack Hook, MD  aspirin EC 81 MG tablet Take 1 tablet (81 mg total) by mouth daily. 12/26/17   Martinique, Peter M, MD  atorvastatin (LIPITOR) 80 MG tablet Take 1 tablet (80 mg total) by mouth daily. 05/12/20   Mack Hook, MD  lisinopril-hydrochlorothiazide (ZESTORETIC) 20-25 MG tablet 1 tab by mouth daily Patient taking differently: Take 1 tablet by mouth daily. 05/12/20   Mack Hook, MD  loratadine (CLARITIN) 10 MG tablet Take 1 tablet (10 mg total) by mouth daily. 09/22/20   Mack Hook, MD  nitroGLYCERIN (NITROSTAT) 0.4 MG SL tablet Place 1 tablet (0.4 mg total) under the tongue every 5 (five) minutes as needed for chest pain. 02/06/19   Mack Hook, MD  olopatadine (PATADAY) 0.1 % ophthalmic solution Place 1 drop into both eyes 2 (two) times daily. Patient not taking: Reported on 11/08/2021 09/22/20   Mack Hook, MD  tamsulosin (FLOMAX) 0.4 MG CAPS capsule 1 cap by mouth at bedtime. Patient taking differently: 0.4 mg at bedtime. 05/12/20   Mack Hook, MD  VASCEPA 1 g capsule Take 2 capsules by mouth twice daily 10/30/20   Mack Hook, MD    Physical Exam: Vitals:   05/12/22 1325 05/12/22 1400 05/12/22 1600 05/12/22 1621  BP: (!) 120/99 (!) 138/92 (!) 122/98 (!) 130/99  Pulse: 86 79 95 90  Resp: '15 16 15 18  ' Temp:    98.2 F (36.8 C)  TempSrc:    Oral  SpO2: 94% 96% 96% 98%  Weight:      Height:       General:  Appears calm and comfortable and is in NAD. Sleepy  Eyes:  PERRL, EOMI, normal lids, iris ENT:  grossly normal hearing, lips & tongue, dry mucous membranes; appropriate dentition Neck:  no LAD, masses or thyromegaly; no carotid bruits, +JVD Cardiovascular:  RRR, no m/r/g. No LE edema.  Respiratory:   CTA bilaterally with no wheezes/rales/rhonchi.  Normal respiratory  effort. Abdomen:  soft, NT, ND, NABS Back:   normal alignment, no CVAT Skin:  no rash or induration seen on limited exam Musculoskeletal:  grossly normal tone BUE/BLE, good ROM, no bony abnormality Lower extremity:  No LE edema.  Limited foot exam with no ulcerations.  2+ distal pulses. Psychiatric:  grossly normal mood and affect, speech fluent and appropriate, AOx3 Neurologic:  CN 2-12 grossly intact, moves all extremities in coordinated fashion, sensation intact   Radiological Exams on Admission: Independently reviewed - see discussion in A/P where applicable  CT Angio Chest PE W and/or Wo Contrast  Result Date: 05/12/2022 CLINICAL DATA:  Pulmonary embolism (PE) suspected, high prob EXAM: CT ANGIOGRAPHY CHEST WITH CONTRAST TECHNIQUE: Multidetector CT imaging of the chest was performed using the standard protocol during bolus administration of intravenous contrast. Multiplanar CT image reconstructions and MIPs were obtained to evaluate the vascular anatomy.  RADIATION DOSE REDUCTION: This exam was performed according to the departmental dose-optimization program which includes automated exposure control, adjustment of the mA and/or kV according to patient size and/or use of iterative reconstruction technique. CONTRAST:  66m OMNIPAQUE IOHEXOL 350 MG/ML SOLN COMPARISON:  November 2022 FINDINGS: Cardiovascular: Satisfactory opacification of the pulmonary arteries to the segmental level. No evidence of pulmonary embolism. Cardiomegaly. No pericardial effusion. Coronary artery calcification. Thoracic aorta is normal in caliber. Mediastinum/Nodes: Mildly prominent mediastinal lymph nodes are probably reactive. Included thyroid is unremarkable. Lungs/Pleura: Patchy ground-glass and consolidative opacities. Interlobular septal thickening primarily in the left greater than right upper lobes. Small right and trace left pleural effusions. No pneumothorax. Upper Abdomen: No acute abnormality. Musculoskeletal:  No acute osseous abnormality. Review of the MIP images confirms the above findings. IMPRESSION: No evidence of acute pulmonary embolism. Bilateral pulmonary opacities. Small right and trace left pleural effusions. Cardiomegaly. Pulmonary edema is favored but pneumonia is also possible in the appropriate setting. Electronically Signed   By: PMacy MisM.D.   On: 05/12/2022 11:23   DG Chest Port 1 View  Result Date: 05/12/2022 CLINICAL DATA:  Provided history: Cough. Additional history provided: Shortness of breath, cough, history of asthma, possible fever. EXAM: PORTABLE CHEST 1 VIEW COMPARISON:  CT angiogram chest 11/08/2021. Prior chest radiographs 11/08/2021 and earlier. FINDINGS: Cardiomegaly with central pulmonary vascular congestion. Interstitial and ill-defined airspace opacities throughout both lungs. No evidence of pleural effusion or pneumothorax. No acute bony abnormality identified. IMPRESSION: Cardiomegaly with central pulmonary vascular congestion. Interstitial and ill-defined airspace opacities throughout both lungs. This may reflect pulmonary edema. However, atypical/viral pneumonia can also have this radiographic appearance and clinical correlation is recommended. Electronically Signed   By: KKellie SimmeringD.O.   On: 05/12/2022 09:17    EKG: Independently reviewed.  NSR with rate 91. LVH; nonspecific ST changes with no evidence of acute ischemia   Labs on Admission: I have personally reviewed the available labs and imaging studies at the time of the admission.  Pertinent labs:   bnp: 185,  troponin 29>24  Assessment and Plan: Principal Problem:   Acute respiratory failure with hypoxia (HCC) Active Problems:   Acute on chronic combined systolic and diastolic CHF (congestive heart failure) (HCC)   possible pneumonia with sepsis criteria    Elevated troponin   Polysubstance abuse (HCC)   Essential hypertension   Coronary artery disease   Mixed hyperlipidemia   Asthma,  chronic    Assessment and Plan: * Acute respiratory failure with hypoxia ([place] 56year old male presenting with 2 day history of progressively worsening shortness of breath, cough and subjective fevers found to be hypoxic to 87-88% on room air and requiring 4L [place] Oxygen -admit to telemetry -multifactorial causes including acute on chronic combined CHF exacerbation, possible sepsis with pneumonia and asthma and cocaine use  -oxygen demand trending downward after high output from lasix and feels better, continue to treat CHF exacerbation -will cover him for pneumonia, check pct/RVP; however, clinically more suggestive of CHF -had wheezing on arrival and received steroids/nebs; however, he has no wheezing on exam. Will continue with breathing treatments for now -wean oxygen as tolerated.   Acute on chronic combined systolic and diastolic CHF (congestive heart failure) (HCC) 2 day history of progressive shortness of breath, hypoxia and orthopnea in setting of cocaine use -pulmonary vascular congestion on CXR/pulmonary edema, elevated BNP and +JVD -last echo in 10/2021: EF of 40-45% with LVF mild to moderately decreased. Akinesis of the inferolateral wall  with overall mild to moderate LV dysfunction. Grade 2 DD -repeat echo pending -strict I/O -daily weights -given 1m lasix in ED and already put out over 1L with improvement in respiratory status and decreased oxygen demand. Not on lasix outpatient. Will given additional 255mtoday for 4021motal and see response -adjust lasix per day team     possible pneumonia with sepsis criteria  History of shortness of breath, productive cough and subjective fevers at home, none recorded here and findings on CXR that could be pneumonia related.  Sepsis criteria met with HR/RR.  Will continue Rocephin and azithromycin Check urinary antigens BC pending and PCT pending Check RVP Robitussin DM prn    Elevated troponin Flat/downtrending in setting of  cocaine use and acute respiratory failure. Likely demand ischemia Not consistent with ACS  Continue telemetry   Polysubstance abuse (HCCWellingDS pending, admitted to cocaine use 2 days ago Encouraged cessation   Essential hypertension Blood pressure has been decently well controlled  He has been off all blood pressure medication since he ran out of medicine Avoid norvasc until EF results and avoid beta blocker with cocaine use Will continue to monitor pressures as he may only need one agent added back   Coronary artery disease S/p DES to OM, no chest pain reported to me, troponin not consistent with ACS Continue telemetry and medical management ASA, statin   Mixed hyperlipidemia Continue lipitor  Asthma, chronic History of asthma Was wheezing in ED and given steroids, breathing treatment; however, no wheezing on exam and denies any tightness or wheezing, also could be cocaine related  -continue SABA prn and will schedule duonebs x 3 -continue to monitor, hold steroids at this time     Advance Care Planning:   Code Status: Full Code   Consults: none   DVT Prophylaxis: lovenox   Family Communication: none   Severity of Illness: The appropriate patient status for this patient is INPATIENT. Inpatient status is judged to be reasonable and necessary in order to provide the required intensity of service to ensure the patient's safety. The patient's presenting symptoms, physical exam findings, and initial radiographic and laboratory data in the context of their chronic comorbidities is felt to place them at high risk for further clinical deterioration. Furthermore, it is not anticipated that the patient will be medically stable for discharge from the hospital within 2 midnights of admission.   * I certify that at the point of admission it is my clinical judgment that the patient will require inpatient hospital care spanning beyond 2 midnights from the point of admission due to high  intensity of service, high risk for further deterioration and high frequency of surveillance required.*  Author: AllOrma FlamingD 05/12/2022 5:57 PM  For on call review [email or web address]

## 2022-05-12 NOTE — ED Notes (Signed)
MD notified pt states SOB

## 2022-05-12 NOTE — ED Notes (Signed)
Patient transported to CT 

## 2022-05-12 NOTE — Assessment & Plan Note (Addendum)
History of asthma Was wheezing in ED and given steroids, breathing treatment; however, no wheezing on exam and denies any tightness or wheezing, also could be cocaine related  -continue SABA prn and will schedule duonebs x 3 -continue to monitor, hold steroids at this time

## 2022-05-12 NOTE — ED Notes (Signed)
Admit MD at bedside

## 2022-05-12 NOTE — ED Notes (Signed)
Pts states feeling better after breathing tx. Pt remains tachypneic with labored RR, will continue to reassess pt. MD notified

## 2022-05-13 ENCOUNTER — Other Ambulatory Visit (HOSPITAL_COMMUNITY): Payer: Self-pay

## 2022-05-13 ENCOUNTER — Inpatient Hospital Stay (HOSPITAL_COMMUNITY): Payer: Medicaid Other

## 2022-05-13 ENCOUNTER — Encounter (HOSPITAL_COMMUNITY): Payer: Self-pay | Admitting: Family Medicine

## 2022-05-13 DIAGNOSIS — I5043 Acute on chronic combined systolic (congestive) and diastolic (congestive) heart failure: Secondary | ICD-10-CM

## 2022-05-13 DIAGNOSIS — J9601 Acute respiratory failure with hypoxia: Secondary | ICD-10-CM | POA: Diagnosis not present

## 2022-05-13 DIAGNOSIS — A419 Sepsis, unspecified organism: Secondary | ICD-10-CM

## 2022-05-13 DIAGNOSIS — J189 Pneumonia, unspecified organism: Secondary | ICD-10-CM | POA: Diagnosis not present

## 2022-05-13 LAB — BASIC METABOLIC PANEL
Anion gap: 10 (ref 5–15)
BUN: 19 mg/dL (ref 6–20)
CO2: 24 mmol/L (ref 22–32)
Calcium: 9.3 mg/dL (ref 8.9–10.3)
Chloride: 105 mmol/L (ref 98–111)
Creatinine, Ser: 0.98 mg/dL (ref 0.61–1.24)
GFR, Estimated: >60 mL/min (ref >60)
Glucose, Bld: 146 mg/dL — ABNORMAL HIGH (ref 70–99)
Potassium: 3.6 mmol/L (ref 3.5–5.1)
Sodium: 139 mmol/L (ref 135–145)

## 2022-05-13 LAB — PROCALCITONIN: Procalcitonin: <0.1 ng/mL

## 2022-05-13 LAB — ECHOCARDIOGRAM COMPLETE
AR max vel: 1.57 cm2
AV Area VTI: 1.51 cm2
AV Area mean vel: 1.49 cm2
AV Mean grad: 7.3 mmHg
AV Peak grad: 13.6 mmHg
Ao pk vel: 1.84 m/s
Area-P 1/2: 8.25 cm2
Calc EF: 37.8 %
Height: 69 in
MV M vel: 5.11 m/s
MV Peak grad: 104.3 mmHg
MV VTI: 1.52 cm2
Radius: 0.4 cm
S' Lateral: 5.6 cm
Single Plane A2C EF: 35 %
Single Plane A4C EF: 44 %
Weight: 2645.52 oz

## 2022-05-13 LAB — CBC
HCT: 41.5 % (ref 39.0–52.0)
Hemoglobin: 13.7 g/dL (ref 13.0–17.0)
MCH: 30.6 pg (ref 26.0–34.0)
MCHC: 33 g/dL (ref 30.0–36.0)
MCV: 92.6 fL (ref 80.0–100.0)
Platelets: 316 10*3/uL (ref 150–400)
RBC: 4.48 MIL/uL (ref 4.22–5.81)
RDW: 13.2 % (ref 11.5–15.5)
WBC: 13.9 10*3/uL — ABNORMAL HIGH (ref 4.0–10.5)
nRBC: 0 % (ref 0.0–0.2)

## 2022-05-13 MED ORDER — LISINOPRIL-HYDROCHLOROTHIAZIDE 20-25 MG PO TABS
ORAL_TABLET | ORAL | 2 refills | Status: DC
  Filled 2022-05-13: qty 30, 30d supply, fill #0

## 2022-05-13 MED ORDER — AMOXICILLIN-POT CLAVULANATE 875-125 MG PO TABS
1.0000 | ORAL_TABLET | Freq: Two times a day (BID) | ORAL | 0 refills | Status: AC
  Filled 2022-05-13: qty 10, 5d supply, fill #0

## 2022-05-13 MED ORDER — AMLODIPINE BESYLATE 10 MG PO TABS
10.0000 mg | ORAL_TABLET | Freq: Every day | ORAL | 2 refills | Status: DC
  Filled 2022-05-13: qty 30, 30d supply, fill #0

## 2022-05-13 MED ORDER — TAMSULOSIN HCL 0.4 MG PO CAPS
ORAL_CAPSULE | ORAL | 2 refills | Status: DC
  Filled 2022-05-13: qty 30, 30d supply, fill #0

## 2022-05-13 MED ORDER — AZITHROMYCIN 250 MG PO TABS
500.0000 mg | ORAL_TABLET | Freq: Every day | ORAL | Status: DC
  Administered 2022-05-13: 500 mg via ORAL
  Filled 2022-05-13: qty 2

## 2022-05-13 NOTE — Progress Notes (Signed)
Heart Failure Nurse Navigator Progress Note  PCP: Mack Hook, MD PCP-Cardiologist: Martinique Admission Diagnosis: Acute respiratory failure with hypoxia, mild intermittent asthma, community acquired pneumonia, acute on chronic systolic heart failure.  Admitted from: Home  Presentation:   Kenneth Long presented with shortness of breath, cough, mild chest pain. BNP 185.6, Troponin 29,  BP 138/92, HR 79, on 3 L oxygen sats 97%,   Patient and girlfriend were educated on the sign and symptoms of heart failure, when to call his doctor, or go to the ER, daily weights, importance of stopping his use of illicit drugs, (per patient,last did cocaine 2-3 days ago, )diet and fluid restrictions, taking all his medications as prescribed and attending all his medical appointments. Both patient and girlfriend verbalized their understanding. Scheduled hospital follow up on 05/24/22 @ 2 pm.   ECHO/ LVEF: 40-45% HFpEF G2DD  Clinical Course:  Past Medical History:  Diagnosis Date   Allergy    SEASONAL   Arthritis    Asthma    Colon adenomas 08/07/2019   Dr. Ardis Hughs 5 Tubular adenomas without high grade dysplasia.   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  Other Topics Concern   Not on file  Social History Narrative   Lives by himself   Unemployed due to hip problems   Born and raised in [place], [place] since early 1980s.   Social Determinants of Health   Financial Resource Strain: Low Risk    Difficulty of Paying Living Expenses: Not hard at all  Food Insecurity: No Food Insecurity   Worried About Charity fundraiser in the Last Year: Never true   [place] in the Last Year: Never true  Transportation Needs: No Transportation Needs   Lack of Transportation (Medical): No   Lack of Transportation (Non-Medical): No  Physical Activity: Not on file  Stress: Not on file  Social Connections: Not on file   Education Assessment and Provision:  Detailed education and instructions provided on heart failure disease management including the following:  Signs and symptoms of Heart Failure When to call the physician Importance of daily weights Low sodium diet Fluid restriction Medication management Anticipated future follow-up appointments  Patient education given on each of the above topics.  Patient acknowledges understanding via  teach back method and acceptance of all instructions.  Education Materials:  "Living Better With Heart Failure" Booklet, HF zone tool, & Daily Weight Tracker Tool.  Patient has scale at home: yes Patient has pill box at home: NA    High Risk Criteria for Readmission and/or Poor Patient Outcomes: Heart failure hospital admissions (last 6 months): 1  No Show rate: 6 % Difficult social situation: No Demonstrates medication adherence: No Primary Language: English Literacy level: Reading, writing and comprehension.   Barriers of Care:   Diet/  fluid restrictions Daily weights Polysubstance use  Considerations/Referrals:   Referral made to Heart Failure Pharmacist Stewardship: yes Referral made to Heart Failure CSW/NCM TOC: No Referral made to Heart & Vascular TOC clinic: yes 05/24/22 @ 2 pm.  Items for Follow-up on DC/TOC: Diet/fluid restrictions Daily weights Medication compliance Polysubstance use (cocaine)     Earnestine Leys, BSN, RN Heart Failure Transport planner Only

## 2022-05-13 NOTE — Progress Notes (Signed)
Mobility Specialist Progress Note:   05/13/22 1300  Mobility  Activity Ambulated independently in hallway  Level of Assistance Independent  Distance Ambulated (ft) 550 ft  Activity Response Tolerated well  $Mobility charge 1 Mobility   Pt agreeable to mobility session. Required no physical assistance throughout. SpO2 WFL on RA throughout. Back in bed with all needs met.   Nelta Numbers Acute Rehab Secure Chat or Office Phone: [phone]

## 2022-05-13 NOTE — Progress Notes (Signed)
RN gave patient discharge instructions and he stated understanding IV has been removed and the patient is waiting for pharmacy to drop off his medication

## 2022-05-13 NOTE — Progress Notes (Signed)
SATURATION QUALIFICATIONS: (This note is used to comply with regulatory documentation for home oxygen)  Patient Saturations on Room Air at Rest = 96%  Patient Saturations on Room Air while Ambulating = 92%  Patient Saturations on N/A Liters of oxygen while Ambulating = N/A%  Please briefly explain why patient needs home oxygen:  [place] or Office Phone: [phone]

## 2022-05-13 NOTE — Discharge Summary (Signed)
Physician Discharge Summary   Kenneth Long OIN:[id number] DOB: 28-Aug-1966 DOA: 05/12/2022  PCP: Mack Hook, MD  Admit date: 05/12/2022 Discharge date:  05/13/2022  Admitted From: Home Disposition:  Home Discharging physician: Dwyane Dee, MD  Recommendations for Outpatient Follow-up:  Follow up BP and med compliance  Home Health:  Equipment/Devices:   Discharge Condition: stable CODE STATUS: Full Diet recommendation:  Diet Orders (From admission, onward)     Start     Ordered   05/13/22 0000  Diet - low sodium heart healthy        05/13/22 1322   05/12/22 1533  Diet 2 gram sodium Room service appropriate? Yes; Fluid consistency: Thin  Diet effective now       Question Answer Comment  Room service appropriate? Yes   Fluid consistency: Thin      05/12/22 [facility] Course:  Assessment and Plan: * Acute respiratory failure with hypoxia (HCC)-resolved as of 05/13/2022 - Ongoing cough prior to admission with hypoxia and questionable infiltrate on CXR.  Also some concern for underlying mild volume overload on admission.  Responded well to IV Lasix and was also initiated on antibiotics - Able to be weaned off of oxygen prior to discharge and ambulated well with no desaturation  Acute on chronic combined systolic and diastolic CHF (congestive heart failure) ([place]) 2 day history of progressive shortness of breath, hypoxia and orthopnea in setting of cocaine use -pulmonary vascular congestion on CXR/pulmonary edema, elevated BNP and +JVD -last echo in 10/2021: EF of 40-45% with LVF mild to moderately decreased. Akinesis of the inferolateral wall with overall mild to moderate LV dysfunction. Grade 2 DD - s/p IV lasix in ER with good urine output - patient counseled on substance abuse cessation as well - no further hypoxia or SOB prior to d/c    Sepsis due to pneumonia (HCC)-resolved as of 05/13/2022 History of shortness of breath, productive  cough and subjective fevers at home, none recorded here and findings on CXR that could be pneumonia related.  Sepsis criteria met with HR/RR - s/p azithro and rocephin; d/c on Augmentin to complete empiric course - PCT and RVP negative   Elevated troponin Flat/downtrending in setting of cocaine use and acute respiratory failure. Likely demand ischemia Not consistent with ACS   Polysubstance abuse ([place]) UDS unable to be obtained; patient admitted to cocaine use 2 days ago Encouraged cessation   Essential hypertension - home meds refilled; patient encouraged for compliance   Coronary artery disease S/p DES to OM, no chest pain reported to me, troponin not consistent with ACS Continue telemetry and medical management ASA, statin   Mixed hyperlipidemia Continue lipitor  Asthma, chronic - No wheezing on exam and low concern for exacerbation.  No steroids indicated    The patient's chronic medical conditions were treated accordingly per the patient's home medication regimen except as noted.  On day of discharge, patient was felt deemed stable for discharge. Patient/family member advised to call PCP or come back to ER if needed.   Principal Diagnosis: Acute respiratory failure with hypoxia [facility])  Discharge Diagnoses: Active Problems:   Acute on chronic combined systolic and diastolic CHF (congestive heart failure) (HCC)   Elevated troponin   Polysubstance abuse (HCC)   Essential hypertension   Coronary artery disease   Mixed hyperlipidemia   Asthma, chronic   Discharge Instructions     Diet - low sodium heart healthy  Complete by: As directed    Increase activity slowly   Complete by: As directed       Allergies as of 05/13/2022       Reactions   Peanut-containing Drug Products Anaphylaxis, Swelling   Shellfish Allergy Anaphylaxis, Swelling   Other    IV Lesiscan- tht iritation with a myocardial perf study 09-13-2018        Medication List     STOP taking  these medications    loratadine 10 MG tablet Commonly known as: CLARITIN       TAKE these medications    albuterol 108 (90 Base) MCG/ACT inhaler Commonly known as: VENTOLIN HFA Inhale 2 puffs into the lungs every 6 (six) hours as needed for wheezing or shortness of breath.   amLODipine 10 MG tablet Commonly known as: NORVASC Take 1 tablet (10 mg total) by mouth daily.   amoxicillin-clavulanate 875-125 MG tablet Commonly known as: AUGMENTIN Take 1 tablet by mouth 2 (two) times daily for 5 days.   aspirin EC 81 MG tablet Take 1 tablet (81 mg total) by mouth daily.   atorvastatin 80 MG tablet Commonly known as: LIPITOR Take 1 tablet (80 mg total) by mouth daily.   lisinopril-hydrochlorothiazide 20-25 MG tablet Commonly known as: ZESTORETIC Take 1 tablet by mouth once daily. What changed: additional instructions   nitroGLYCERIN 0.4 MG SL tablet Commonly known as: NITROSTAT Place 1 tablet (0.4 mg total) under the tongue every 5 (five) minutes as needed for chest pain.   tamsulosin 0.4 MG Caps capsule Commonly known as: FLOMAX Take 1 capsule by mouth at bedtime. What changed: additional instructions   Vascepa 1 g capsule Generic drug: icosapent Ethyl Take 2 capsules by mouth twice daily        Allergies  Allergen Reactions   Peanut-Containing Drug Products Anaphylaxis and Swelling   Shellfish Allergy Anaphylaxis and Swelling   Other     IV Lesiscan- tht iritation with a myocardial perf study 09-13-2018    Consultations:   Procedures:   Discharge Exam: BP 126/87 (BP Location: Right Arm)   Pulse (!) 104   Temp (!) 97.4 F (36.3 C)   Resp 18   Ht _0  (1.753 m)   Wt 75 kg   SpO2 98%   BMI 24.42 kg/m  Physical Exam Constitutional:      General: He is not in acute distress.    Appearance: Normal appearance.  HENT:     Head: Normocephalic and atraumatic.     Mouth/Throat:     Mouth: Mucous membranes are moist.  Eyes:     Extraocular Movements:  Extraocular movements intact.  Cardiovascular:     Rate and Rhythm: Normal rate and regular rhythm.     Heart sounds: Normal heart sounds.  Pulmonary:     Effort: Pulmonary effort is normal. No respiratory distress.     Breath sounds: Normal breath sounds. No wheezing.  Abdominal:     General: Bowel sounds are normal. There is no distension.     Palpations: Abdomen is soft.     Tenderness: There is no abdominal tenderness.  Musculoskeletal:        General: Normal range of motion.     Cervical back: Normal range of motion and neck supple.  Skin:    General: Skin is warm and dry.  Neurological:     General: No focal deficit present.     Mental Status: He is alert.  Psychiatric:  Mood and Affect: Mood normal.        Behavior: Behavior normal.     The results of significant diagnostics from this hospitalization (including imaging, microbiology, ancillary and laboratory) are listed below for reference.   Microbiology: Recent Results (from the past 240 hour(s))  Resp Panel by RT-PCR (Flu A&B, Covid) Nasopharyngeal Swab     Status: None   Collection Time: 05/12/22  8:49 AM   Specimen: Nasopharyngeal Swab; Nasopharyngeal(NP) swabs in vial transport medium  Result Value Ref Range Status   SARS Coronavirus 2 by RT PCR NEGATIVE NEGATIVE Final    Comment: (NOTE) SARS-CoV-2 target nucleic acids are NOT DETECTED.  The SARS-CoV-2 RNA is generally detectable in upper respiratory specimens during the acute phase of infection. The lowest concentration of SARS-CoV-2 viral copies this assay can detect is 138 copies/mL. A negative result does not preclude SARS-Cov-2 infection and should not be used as the sole basis for treatment or other patient management decisions. A negative result may occur with  improper specimen collection/handling, submission of specimen other than nasopharyngeal swab, presence of viral mutation(s) within the areas targeted by this assay, and inadequate number of  viral copies(<138 copies/mL). A negative result must be combined with clinical observations, patient history, and epidemiological information. The expected result is Negative.  Fact Sheet for Patients:  EntrepreneurPulse.com.au  Fact Sheet for Healthcare Providers:  IncredibleEmployment.be  This test is no t yet approved or cleared by the [place] FDA and  has been authorized for detection and/or diagnosis of SARS-CoV-2 by FDA under an Emergency Use Authorization (EUA). This EUA will remain  in effect (meaning this test can be used) for the duration of the COVID-19 declaration under Section 564(b)(1) of the Act, 21 U.S.C.section 360bbb-3(b)(1), unless the authorization is terminated  or revoked sooner.       Influenza A by PCR NEGATIVE NEGATIVE Final   Influenza B by PCR NEGATIVE NEGATIVE Final    Comment: (NOTE) The Xpert Xpress SARS-CoV-2/FLU/RSV plus assay is intended as an aid in the diagnosis of influenza from Nasopharyngeal swab specimens and should not be used as a sole basis for treatment. Nasal washings and aspirates are unacceptable for Xpert Xpress SARS-CoV-2/FLU/RSV testing.  Fact Sheet for Patients: EntrepreneurPulse.com.au  Fact Sheet for Healthcare Providers: IncredibleEmployment.be  This test is not yet approved or cleared by the [place] FDA and has been authorized for detection and/or diagnosis of SARS-CoV-2 by FDA under an Emergency Use Authorization (EUA). This EUA will remain in effect (meaning this test can be used) for the duration of the COVID-19 declaration under Section 564(b)(1) of the Act, 21 U.S.C. section 360bbb-3(b)(1), unless the authorization is terminated or revoked.  Performed at [facility], [place] [address], [place], [place] [address]   Respiratory (~20 pathogens) panel by PCR     Status: None   Collection Time: 05/12/22  2:36 PM   Specimen:  Nasopharyngeal Swab; Respiratory  Result Value Ref Range Status   Adenovirus NOT DETECTED NOT DETECTED Final   Coronavirus 229E NOT DETECTED NOT DETECTED Final    Comment: (NOTE) The Coronavirus on the Respiratory Panel, DOES NOT test for the novel  Coronavirus (2019 nCoV)    Coronavirus HKU1 NOT DETECTED NOT DETECTED Final   Coronavirus NL63 NOT DETECTED NOT DETECTED Final   Coronavirus OC43 NOT DETECTED NOT DETECTED Final   Metapneumovirus NOT DETECTED NOT DETECTED Final   Rhinovirus / Enterovirus NOT DETECTED NOT DETECTED Final   Influenza A NOT DETECTED NOT DETECTED  Final   Influenza B NOT DETECTED NOT DETECTED Final   Parainfluenza Virus 1 NOT DETECTED NOT DETECTED Final   Parainfluenza Virus 2 NOT DETECTED NOT DETECTED Final   Parainfluenza Virus 3 NOT DETECTED NOT DETECTED Final   Parainfluenza Virus 4 NOT DETECTED NOT DETECTED Final   Respiratory Syncytial Virus NOT DETECTED NOT DETECTED Final   Bordetella pertussis NOT DETECTED NOT DETECTED Final   Bordetella Parapertussis NOT DETECTED NOT DETECTED Final   Chlamydophila pneumoniae NOT DETECTED NOT DETECTED Final   Mycoplasma pneumoniae NOT DETECTED NOT DETECTED Final    Comment: Performed at [facility], [place] [address] [place], [place] [address]  Culture, blood (Routine X 2) w Reflex to ID Panel     Status: None (Preliminary result)   Collection Time: 05/12/22  2:50 PM   Specimen: BLOOD  Result Value Ref Range Status   Specimen Description BLOOD SITE NOT SPECIFIED  Final   Special Requests   Final    BOTTLES DRAWN AEROBIC AND ANAEROBIC Blood Culture adequate volume   Culture   Final    NO GROWTH < 24 HOURS Performed at [facility], [place] [address] [place], [place] [address]    Report Status PENDING  Incomplete     Labs: BNP (last 3 results) Recent Labs    11/08/21 0419 05/12/22 0850  BNP 47.7 811.0*   Basic Metabolic Panel: Recent Labs  Lab 05/12/22 0850 05/13/22 0139  NA 139 139  K 3.5 3.6   CL 110 105  CO2 22 24  GLUCOSE 136* 146*  BUN 13 19  CREATININE 0.88 0.98  CALCIUM 9.5 9.3  MG 2.0  --    Liver Function Tests: Recent Labs  Lab 05/12/22 0850  AST 15  ALT 12  ALKPHOS 71  BILITOT 0.4  PROT 7.4  ALBUMIN 3.8   No results for input(s): LIPASE, AMYLASE in the last 168 hours. No results for input(s): AMMONIA in the last 168 hours. CBC: Recent Labs  Lab 05/12/22 0850 05/13/22 0139  WBC 8.8 13.9*  NEUTROABS 5.8  --   HGB 14.0 13.7  HCT 44.2 41.5  MCV 96.5 92.6  PLT 314 316   Cardiac Enzymes: No results for input(s): CKTOTAL, CKMB, CKMBINDEX, TROPONINI in the last 168 hours. BNP: Invalid input(s): POCBNP CBG: No results for input(s): GLUCAP in the last 168 hours. D-Dimer No results for input(s): DDIMER in the last 72 hours. Hgb A1c No results for input(s): HGBA1C in the last 72 hours. Lipid Profile No results for input(s): CHOL, HDL, LDLCALC, TRIG, CHOLHDL, LDLDIRECT in the last 72 hours. Thyroid function studies No results for input(s): TSH, T4TOTAL, T3FREE, THYROIDAB in the last 72 hours.  Invalid input(s): FREET3 Anemia work up No results for input(s): VITAMINB12, FOLATE, FERRITIN, TIBC, IRON, RETICCTPCT in the last 72 hours. Urinalysis    Component Value Date/Time   COLORURINE YELLOW 08/24/2017 1023   APPEARANCEUR HAZY (A) 08/24/2017 1023   LABSPEC 1.020 08/24/2017 1023   PHURINE 6.0 08/24/2017 1023   GLUCOSEU NEGATIVE 08/24/2017 1023   HGBUR SMALL (A) 08/24/2017 1023   BILIRUBINUR neg 07/28/2018 1200   KETONESUR NEGATIVE 08/24/2017 1023   PROTEINUR Negative 07/28/2018 1200   PROTEINUR NEGATIVE 08/24/2017 1023   UROBILINOGEN 0.2 07/28/2018 1200   UROBILINOGEN 4.0 (H) 05/09/2013 1827   NITRITE neg 07/28/2018 1200   NITRITE NEGATIVE 08/24/2017 1023   LEUKOCYTESUR Trace (A) 07/28/2018 1200   Sepsis Labs Invalid input(s): PROCALCITONIN,  WBC,  LACTICIDVEN Microbiology Recent Results (  from the past 240 hour(s))  Resp Panel by RT-PCR  (Flu A&B, Covid) Nasopharyngeal Swab     Status: None   Collection Time: 05/12/22  8:49 AM   Specimen: Nasopharyngeal Swab; Nasopharyngeal(NP) swabs in vial transport medium  Result Value Ref Range Status   SARS Coronavirus 2 by RT PCR NEGATIVE NEGATIVE Final    Comment: (NOTE) SARS-CoV-2 target nucleic acids are NOT DETECTED.  The SARS-CoV-2 RNA is generally detectable in upper respiratory specimens during the acute phase of infection. The lowest concentration of SARS-CoV-2 viral copies this assay can detect is 138 copies/mL. A negative result does not preclude SARS-Cov-2 infection and should not be used as the sole basis for treatment or other patient management decisions. A negative result may occur with  improper specimen collection/handling, submission of specimen other than nasopharyngeal swab, presence of viral mutation(s) within the areas targeted by this assay, and inadequate number of viral copies(<138 copies/mL). A negative result must be combined with clinical observations, patient history, and epidemiological information. The expected result is Negative.  Fact Sheet for Patients:  EntrepreneurPulse.com.au  Fact Sheet for Healthcare Providers:  IncredibleEmployment.be  This test is no t yet approved or cleared by the [place] FDA and  has been authorized for detection and/or diagnosis of SARS-CoV-2 by FDA under an Emergency Use Authorization (EUA). This EUA will remain  in effect (meaning this test can be used) for the duration of the COVID-19 declaration under Section 564(b)(1) of the Act, 21 U.S.C.section 360bbb-3(b)(1), unless the authorization is terminated  or revoked sooner.       Influenza A by PCR NEGATIVE NEGATIVE Final   Influenza B by PCR NEGATIVE NEGATIVE Final    Comment: (NOTE) The Xpert Xpress SARS-CoV-2/FLU/RSV plus assay is intended as an aid in the diagnosis of influenza from Nasopharyngeal swab specimens  and should not be used as a sole basis for treatment. Nasal washings and aspirates are unacceptable for Xpert Xpress SARS-CoV-2/FLU/RSV testing.  Fact Sheet for Patients: EntrepreneurPulse.com.au  Fact Sheet for Healthcare Providers: IncredibleEmployment.be  This test is not yet approved or cleared by the [place] FDA and has been authorized for detection and/or diagnosis of SARS-CoV-2 by FDA under an Emergency Use Authorization (EUA). This EUA will remain in effect (meaning this test can be used) for the duration of the COVID-19 declaration under Section 564(b)(1) of the Act, 21 U.S.C. section 360bbb-3(b)(1), unless the authorization is terminated or revoked.  Performed at [facility], [place] [address] [place], [place] [address]   Respiratory (~20 pathogens) panel by PCR     Status: None   Collection Time: 05/12/22  2:36 PM   Specimen: Nasopharyngeal Swab; Respiratory  Result Value Ref Range Status   Adenovirus NOT DETECTED NOT DETECTED Final   Coronavirus 229E NOT DETECTED NOT DETECTED Final    Comment: (NOTE) The Coronavirus on the Respiratory Panel, DOES NOT test for the novel  Coronavirus (2019 nCoV)    Coronavirus HKU1 NOT DETECTED NOT DETECTED Final   Coronavirus NL63 NOT DETECTED NOT DETECTED Final   Coronavirus OC43 NOT DETECTED NOT DETECTED Final   Metapneumovirus NOT DETECTED NOT DETECTED Final   Rhinovirus / Enterovirus NOT DETECTED NOT DETECTED Final   Influenza A NOT DETECTED NOT DETECTED Final   Influenza B NOT DETECTED NOT DETECTED Final   Parainfluenza Virus 1 NOT DETECTED NOT DETECTED Final   Parainfluenza Virus 2 NOT DETECTED NOT DETECTED Final   Parainfluenza Virus 3 NOT DETECTED NOT DETECTED Final   Parainfluenza  Virus 4 NOT DETECTED NOT DETECTED Final   Respiratory Syncytial Virus NOT DETECTED NOT DETECTED Final   Bordetella pertussis NOT DETECTED NOT DETECTED Final   Bordetella Parapertussis NOT DETECTED  NOT DETECTED Final   Chlamydophila pneumoniae NOT DETECTED NOT DETECTED Final   Mycoplasma pneumoniae NOT DETECTED NOT DETECTED Final    Comment: Performed at [facility], [place] [address] [place], [place] [address]  Culture, blood (Routine X 2) w Reflex to ID Panel     Status: None (Preliminary result)   Collection Time: 05/12/22  2:50 PM   Specimen: BLOOD  Result Value Ref Range Status   Specimen Description BLOOD SITE NOT SPECIFIED  Final   Special Requests   Final    BOTTLES DRAWN AEROBIC AND ANAEROBIC Blood Culture adequate volume   Culture   Final    NO GROWTH < 24 HOURS Performed at [facility], [place] [address] [place], [place] [address]    Report Status PENDING  Incomplete    Procedures/Studies: CT Angio Chest PE W and/or Wo Contrast  Result Date: 05/12/2022 CLINICAL DATA:  Pulmonary embolism (PE) suspected, high prob EXAM: CT ANGIOGRAPHY CHEST WITH CONTRAST TECHNIQUE: Multidetector CT imaging of the chest was performed using the standard protocol during bolus administration of intravenous contrast. Multiplanar CT image reconstructions and MIPs were obtained to evaluate the vascular anatomy. RADIATION DOSE REDUCTION: This exam was performed according to the departmental dose-optimization program which includes automated exposure control, adjustment of the mA and/or kV according to patient size and/or use of iterative reconstruction technique. CONTRAST:  16m OMNIPAQUE IOHEXOL 350 MG/ML SOLN COMPARISON:  November 2022 FINDINGS: Cardiovascular: Satisfactory opacification of the pulmonary arteries to the segmental level. No evidence of pulmonary embolism. Cardiomegaly. No pericardial effusion. Coronary artery calcification. Thoracic aorta is normal in caliber. Mediastinum/Nodes: Mildly prominent mediastinal lymph nodes are probably reactive. Included thyroid is unremarkable. Lungs/Pleura: Patchy ground-glass and consolidative opacities. Interlobular septal thickening primarily  in the left greater than right upper lobes. Small right and trace left pleural effusions. No pneumothorax. Upper Abdomen: No acute abnormality. Musculoskeletal: No acute osseous abnormality. Review of the MIP images confirms the above findings. IMPRESSION: No evidence of acute pulmonary embolism. Bilateral pulmonary opacities. Small right and trace left pleural effusions. Cardiomegaly. Pulmonary edema is favored but pneumonia is also possible in the appropriate setting. Electronically Signed   By: PMacy MisM.D.   On: 05/12/2022 11:23   DG Chest Port 1 View  Result Date: 05/12/2022 CLINICAL DATA:  Provided history: Cough. Additional history provided: Shortness of breath, cough, history of asthma, possible fever. EXAM: PORTABLE CHEST 1 VIEW COMPARISON:  CT angiogram chest 11/08/2021. Prior chest radiographs 11/08/2021 and earlier. FINDINGS: Cardiomegaly with central pulmonary vascular congestion. Interstitial and ill-defined airspace opacities throughout both lungs. No evidence of pleural effusion or pneumothorax. No acute bony abnormality identified. IMPRESSION: Cardiomegaly with central pulmonary vascular congestion. Interstitial and ill-defined airspace opacities throughout both lungs. This may reflect pulmonary edema. However, atypical/viral pneumonia can also have this radiographic appearance and clinical correlation is recommended. Electronically Signed   By: KKellie SimmeringD.O.   On: 05/12/2022 09:17   ECHOCARDIOGRAM COMPLETE  Result Date: 05/13/2022    ECHOCARDIOGRAM REPORT   Patient Name[facility] Medical Rec #:  [id number]           Height:       69.0 in Accession #:    [id number]  Weight:       165.3 lb Date of Birth:  07-Dec-1966            BSA:          1.906 m Patient Age:    27 years             BP:           126/87 mmHg Patient Gender: M                    HR:           83 bpm. Exam Location:  Inpatient Procedure: 2D Echo, Cardiac Doppler, Color  Doppler and Strain Analysis Indications:    CHF  History:        Patient has prior history of Echocardiogram examinations, most                 recent 11/08/2021. Cardiomyopathy, CAD and Previous Myocardial                 Infarction, Signs/Symptoms:Dyspnea and Murmur; Risk                 Factors:Hypertension and Dyslipidemia. 03/16/18 cath.  Sonographer:    Luisa Hart RDCS Referring Phys: [id number] [place]  1. Left ventricular ejection fraction, by estimation, is 40 to 45%. The left ventricle has mildly decreased function. The left ventricle demonstrates regional wall motion abnormalities (see scoring diagram/findings for description). There is mild left ventricular hypertrophy. Left ventricular diastolic parameters are consistent with Grade II diastolic dysfunction (pseudonormalization). Elevated left ventricular end-diastolic pressure. The E/e' is 3. There is severe akinesis of the left ventricular, basal-mid inferior wall and inferolateral wall.  2. Right ventricular systolic function is normal. The right ventricular size is normal. There is severely elevated pulmonary artery systolic pressure. The estimated right ventricular systolic pressure is 35.5 mmHg.  3. Left atrial size was severely dilated.  4. The pericardial effusion is localized near the right atrium.  5. The posterior leaflet appears ischemically tethered. The mitral valve is abnormal. Moderate to severe mitral valve regurgitation.  6. The aortic valve is tricuspid. Aortic valve regurgitation is not visualized.  7. The inferior vena cava is dilated in size with <50% respiratory variability, suggesting right atrial pressure of 15 mmHg. Comparison(s): Changes from prior study are noted. 11/08/2021: LVEF 40-45%, inferolateral wall akinesis. FINDINGS  Left Ventricle: Left ventricular ejection fraction, by estimation, is 40 to 45%. The left ventricle has mildly decreased function. The left ventricle demonstrates regional wall motion  abnormalities. Severe akinesis of the left ventricular, basal-mid inferior wall and inferolateral wall. The left ventricular internal cavity size was normal in size. There is mild left ventricular hypertrophy. Left ventricular diastolic parameters are consistent with Grade II diastolic dysfunction (pseudonormalization). Elevated left ventricular end-diastolic pressure. The E/e' is 39. Right Ventricle: The right ventricular size is normal. No increase in right ventricular wall thickness. Right ventricular systolic function is normal. There is severely elevated pulmonary artery systolic pressure. The tricuspid regurgitant velocity is 3.41 m/s, and with an assumed right atrial pressure of 15 mmHg, the estimated right ventricular systolic pressure is 97.4 mmHg. Left Atrium: Left atrial size was severely dilated. Right Atrium: Right atrial size was normal in size. Pericardium: Trivial pericardial effusion is present. The pericardial effusion is localized near the right atrium. Mitral Valve: The posterior leaflet appears ischemically tethered. The mitral valve is abnormal. There is moderate calcification of the anterior and posterior mitral valve leaflet(s).  Moderate to severe mitral valve regurgitation, with posteriorly-directed jet. MV peak gradient, 9.6 mmHg. The mean mitral valve gradient is 4.0 mmHg. Tricuspid Valve: The tricuspid valve is grossly normal. Tricuspid valve regurgitation is mild. Aortic Valve: The aortic valve is tricuspid. Aortic valve regurgitation is not visualized. Aortic valve mean gradient measures 7.3 mmHg. Aortic valve peak gradient measures 13.6 mmHg. Aortic valve area, by VTI measures 1.51 cm. Pulmonic Valve: The pulmonic valve was grossly normal. Pulmonic valve regurgitation is trivial. Aorta: The aortic root and ascending aorta are structurally normal, with no evidence of dilitation. Venous: The inferior vena cava is dilated in size with less than 50% respiratory variability, suggesting  right atrial pressure of 15 mmHg. IAS/Shunts: No atrial level shunt detected by color flow Doppler.  LEFT VENTRICLE PLAX 2D LVIDd:         5.90 cm      Diastology LVIDs:         5.60 cm      LV e' medial:    7.12 cm/s LV PW:         1.10 cm      LV E/e' medial:  17.4 LV IVS:        1.30 cm      LV e' lateral:   6.87 cm/s LVOT diam:     1.90 cm      LV E/e' lateral: 18.0 LV SV:         45 LV SV Index:   24 LVOT Area:     2.84 cm  LV Volumes (MOD) LV vol d, MOD A2C: 115.0 ml LV vol d, MOD A4C: 135.0 ml LV vol s, MOD A2C: 74.8 ml LV vol s, MOD A4C: 75.6 ml LV SV MOD A2C:     40.2 ml LV SV MOD A4C:     135.0 ml LV SV MOD BP:      47.5 ml RIGHT VENTRICLE RV Basal diam:  4.20 cm RV Mid diam:    2.10 cm RV S prime:     14.10 cm/s TAPSE (M-mode): 2.2 cm LEFT ATRIUM              Index        RIGHT ATRIUM           Index LA diam:        4.50 cm  2.36 cm/m   RA Area:     15.10 cm LA Vol (A2C):   160.0 ml 83.97 ml/m  RA Volume:   36.60 ml  19.21 ml/m LA Vol (A4C):   130.0 ml 68.22 ml/m LA Biplane Vol: 151.0 ml 79.24 ml/m  AORTIC VALVE                     PULMONIC VALVE AV Area (Vmax):    1.57 cm      PV Vmax:          1.12 m/s AV Area (Vmean):   1.49 cm      PV Vmean:         78.967 cm/s AV Area (VTI):     1.51 cm      PV VTI:           0.200 m AV Vmax:           184.33 cm/s   PV Peak grad:     5.0 mmHg AV Vmean:          121.333 cm/s  PV Mean grad:  2.7 mmHg AV VTI:            0.296 m       PR End Diast Vel: 5.02 msec AV Peak Grad:      13.6 mmHg AV Mean Grad:      7.3 mmHg LVOT Vmax:         102.00 cm/s LVOT Vmean:        63.700 cm/s LVOT VTI:          0.158 m LVOT/AV VTI ratio: 0.53  AORTA Ao Root diam: 2.90 cm Ao Asc diam:  2.90 cm MITRAL VALVE                 TRICUSPID VALVE MV Area (PHT): 8.25 cm      TR Peak grad:   46.5 mmHg MV Area VTI:   1.52 cm      TR Vmax:        341.00 cm/s MV Peak grad:  9.6 mmHg MV Mean grad:  4.0 mmHg      SHUNTS MV Vmax:       1.55 m/s      Systemic VTI:  0.16 m MV Vmean:       86.8 cm/s     Systemic Diam: 1.90 cm MV Decel Time: 92 msec MR Peak grad:   104.3 mmHg MR Mean grad:   72.0 mmHg MR Vmax:        510.67 cm/s MR Vmean:       403.0 cm/s MR PISA:        1.01 cm MR PISA Radius: 0.40 cm MV E velocity: 123.75 cm/s MV A velocity: 42.80 cm/s MV E/A ratio:  2.89 Lyman Bishop MD Electronically signed by Lyman Bishop MD Signature Date/Time: 05/13/2022/12:01:41 PM    Final      Time coordinating discharge: Over 30 minutes    Dwyane Dee, MD  [facility] 05/13/2022, 1:29 PM

## 2022-05-13 NOTE — Progress Notes (Addendum)
Heart Failure Stewardship Pharmacist Progress Note   PCP: Kenneth Hook, MD PCP-Cardiologist: Peter Martinique, MD    HPI:  [id number] Kenneth Long admitted on 05/12/22 w/ SOB, cough, and subjective fever. Prior admit for chest pain likely d/t cocaine abuse on 11/08/21. On discharge, carvedilol 25 mg BID was d/c'ed, amlodipine 10 mg daily and lisinopril HCTZ 20-25 were initiated. PMH of CAD, CHF (diagnosed post STEMI in March 2017), HLD, HTN, OSA, STEMI s/p DES to OM which was 100% occluded, polysubstance abuse (cocaine, cannabis)  arthritis, and current occasional smoking. The patient has undergone hip replacement surgery.   Patient's Echo on 03/17/16 showed LVEF 40-45%, mild LA dilation, and mild TR. A more recent Echo on 11/08/21 showed the same LVEF at 40-45% and mild LV dilation with mild LV hypertrophy and G2DD, moderate LA  dilation, and mild MR. An additional Echo is scheduled for 05/13/22. Additionally, a Myoview test on 09/13/18 showed a stable LVEF at 45% and evidence of MI with no evidence of ischemia. Judged an intermediate risk study.  A CXR on 05/12/22 detected cardiomegaly w/ central pulmonary vascular congestion w/ airspace opacities. Suspicion of viral pneumonia, or possibly pulmonary edema. CTA was negative for PE. IV diuresis started on 05/12/22.  Current HF Medications: - None  Prior to admission HF Medications: - None  Pertinent Lab Values: Serum creatinine 0.98, BUN 19, Potassium 3.6, Sodium 139, BNP 185.6, Magnesium 2.0  Vital Signs: Weight: 165 lbs (admission weight: 165 lbs) Blood pressure: 120's/80's-90's Heart rate: 80's-100's I/O: -1.1L yesterday; net -1.18L  Medication Assistance / Insurance Benefits Check: Does the patient have prescription insurance?  Yes Type of insurance plan: Managed Medicaid  Outpatient Pharmacy:  Prior to admission outpatient pharmacy: [facility] Is the patient willing to use [facility] at discharge? Pending Is the patient willing to transition  their outpatient pharmacy to utilize a [facility] outpatient pharmacy?   Pending   Assessment: 1. Acute on chronic systolic CHF (LVEF 87-68%), due to nonischemic cardiomyopathy. NYHA class III symptoms. - Echo for this admission is scheduled - Patient LVEF indicative of HFmrEF, initiate GDMT: Entresto.  - Prior cocaine use prohibits use of non-carvedilol beta blockers, consider carvedilol initiation once confirmed euvolemic  - Consider spironolactone prior to discharge - Consider SGLT2i prior to discharge - Continued diuresis pending provider examination  - Post-diuresis patient's potassium is below target, initiate supplementation    Plan: 1) Medication changes recommended at this time: - initiate potassium supplementation 40 mEq PO once - Initiate Entresto 24/26 mg tabs BID - Daily BMET and magnesium levels  2) Patient assistance: - PA required for Entresto, Jardiance, and Farxiga   3)  Education  - Patient has been educated on current HF medications and potential additions to HF medication regimen - Patient verbalizes understanding that over the next few months, these medication doses may change and more medications may be added to optimize HF regimen - Patient has been educated on basic disease state pathophysiology and goals of therapy  Barnet Pall, PharmD candidate

## 2022-05-17 LAB — CULTURE, BLOOD (ROUTINE X 2)
Culture: NO GROWTH
Culture: NO GROWTH
Special Requests: ADEQUATE
Special Requests: ADEQUATE

## 2022-05-23 NOTE — Progress Notes (Incomplete)
HEART & VASCULAR TRANSITION OF CARE CONSULT NOTE     Referring Physician: Primary Care: Primary Cardiologist:  HPI: Referred to clinic by *** for heart failure consultation. 56 y.o. male with history of CAD with prior inferior STEMI 03/17 s/p DES OM2, iCM (EF 40-45% on echo 03/17), HTN, HLD, polysubstance abuse (ETOH, marijuana, cocaine).  He is followed by Dr. Martinique with [facility].   He was admitted with chest pain 11/22 which was felt to be secondary to coronary vasospasm from cocaine use. UDS + cocaine. Echo with EF 40-45%. Coreg was discontinued.   He was admitted 05/23 with acute respiratory failure with hypoxia secondary to PNA and a/c CHF.  Had been using cocaine prior to admission. He diuresed with IV lasix. Not started on diuretic at discharge.  Cardiac Testing    Review of Systems: [y] = yes, '[ ]'$  = no   General: Weight gain '[ ]'$ ; Weight loss '[ ]'$ ; Anorexia '[ ]'$ ; Fatigue '[ ]'$ ; Fever '[ ]'$ ; Chills '[ ]'$ ; Weakness '[ ]'$   Cardiac: Chest pain/pressure '[ ]'$ ; Resting SOB '[ ]'$ ; Exertional SOB '[ ]'$ ; Orthopnea '[ ]'$ ; Pedal Edema '[ ]'$ ; Palpitations '[ ]'$ ; Syncope '[ ]'$ ; Presyncope '[ ]'$ ; Paroxysmal nocturnal dyspnea'[ ]'$   Pulmonary: Cough '[ ]'$ ; Wheezing'[ ]'$ ; Hemoptysis'[ ]'$ ; Sputum '[ ]'$ ; Snoring '[ ]'$   GI: Vomiting'[ ]'$ ; Dysphagia'[ ]'$ ; Melena'[ ]'$ ; Hematochezia '[ ]'$ ; Heartburn'[ ]'$ ; Abdominal pain '[ ]'$ ; Constipation '[ ]'$ ; Diarrhea '[ ]'$ ; BRBPR '[ ]'$   GU: Hematuria'[ ]'$ ; Dysuria '[ ]'$ ; Nocturia'[ ]'$   Vascular: Pain in legs with walking '[ ]'$ ; Pain in feet with lying flat '[ ]'$ ; Non-healing sores '[ ]'$ ; Stroke '[ ]'$ ; TIA '[ ]'$ ; Slurred speech '[ ]'$ ;  Neuro: Headaches'[ ]'$ ; Vertigo'[ ]'$ ; Seizures'[ ]'$ ; Paresthesias'[ ]'$ ;Blurred vision '[ ]'$ ; Diplopia '[ ]'$ ; Vision changes '[ ]'$   Ortho/Skin: Arthritis '[ ]'$ ; Joint pain '[ ]'$ ; Muscle pain '[ ]'$ ; Joint swelling '[ ]'$ ; Back Pain '[ ]'$ ; Rash '[ ]'$   Psych: Depression'[ ]'$ ; Anxiety'[ ]'$   Heme: Bleeding problems '[ ]'$ ; Clotting disorders '[ ]'$ ; Anemia '[ ]'$   Endocrine: Diabetes '[ ]'$ ; Thyroid dysfunction'[ ]'$    Past Medical History:   Diagnosis Date   Allergy    SEASONAL   Arthritis    Asthma    Colon adenomas 08/07/2019   Dr. Ardis Hughs 5 Tubular adenomas without high grade dysplasia.   Coronary artery disease    Dyspnea    "sometimes" with exertion    Headache    Heart murmur    Hyperlipidemia    Hypertension    Sleep apnea    no CPAP   ST elevation (STEMI) myocardial infarction involving left circumflex coronary artery (HCC) 03/16/2016   DES CFX   Umbilical hernia     Current Outpatient Medications  Medication Sig Dispense Refill   albuterol (VENTOLIN HFA) 108 (90 Base) MCG/ACT inhaler Inhale 2 puffs into the lungs every 6 (six) hours as needed for wheezing or shortness of breath. 18 g 0   amLODipine (NORVASC) 10 MG tablet Take 1 tablet (10 mg total) by mouth daily. 30 tablet 2   aspirin EC 81 MG tablet Take 1 tablet (81 mg total) by mouth daily. 90 tablet 3   atorvastatin (LIPITOR) 80 MG tablet Take 1 tablet (80 mg total) by mouth daily. 30 tablet 7   lisinopril-hydrochlorothiazide (ZESTORETIC) 20-25 MG tablet Take 1 tablet by mouth once daily. 30 tablet 2   nitroGLYCERIN (NITROSTAT) 0.4 MG SL tablet Place 1 tablet (0.4 mg total)  under the tongue every 5 (five) minutes as needed for chest pain. 25 tablet 12   tamsulosin (FLOMAX) 0.4 MG CAPS capsule Take 1 capsule by mouth at bedtime. 30 capsule 2   VASCEPA 1 g capsule Take 2 capsules by mouth twice daily (Patient not taking: Reported on 05/12/2022) 120 capsule 10   No current facility-administered medications for this visit.    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  Other Topics Concern   Not on file  Social History Narrative   Lives by himself   Unemployed due to hip problems   Born and raised in [place], [place] early 1980s.   Social Determinants of Health   Financial Resource Strain: Low Risk    Difficulty of Paying Living Expenses: Not hard at all  Food Insecurity: No Food Insecurity   Worried About Charity fundraiser in the Last Year: Never true   [place] in the Last Year: Never true  Transportation Needs: No Transportation Needs   Lack of Transportation (Medical): No   Lack of Transportation (Non-Medical): No  Physical Activity: Not on file  Stress: Not on file  Social Connections: Not on file  Intimate Partner Violence: Not on file      Family History  Problem Relation Age of Onset   Hypertension Mother    Sarcoidosis Mother    Hypertension Father    Hypertension Sister    Hypertension Sister    Colon cancer Neg Hx    Colon polyps Neg Hx    Esophageal cancer Neg Hx    Stomach cancer Neg Hx    Rectal cancer Neg Hx     There were no vitals filed for this visit.  PHYSICAL EXAM: General:  Well appearing. No respiratory difficulty HEENT: normal Neck: supple. no JVD. Carotids 2+ bilat; no bruits. No lymphadenopathy or thryomegaly appreciated. Cor: PMI nondisplaced. Regular rate & rhythm. No rubs, gallops or murmurs. Lungs: clear Abdomen: soft, nontender, nondistended. No  hepatosplenomegaly. No bruits or masses. Good bowel sounds. Extremities: no cyanosis, clubbing, rash, edema Neuro: alert & oriented x 3, cranial nerves grossly intact. moves all 4 extremities w/o difficulty. Affect pleasant.  ECG:   ASSESSMENT & PLAN: HFmEF:  CAD:  3. HTN:  4. HLD:  5. Polysubstance abuse:  NYHA *** GDMT  Diuretic- BB- Ace/ARB/ARNI MRA SGLT2i    Referred to HFSW (PCP, Medications, Transportation, ETOH Abuse, Drug Abuse,  Insurance, Museum/gallery curator ): Yes or No Refer to Pharmacy: Yes or No Refer to Home Health: Yes on No Refer to Advanced Heart Failure Clinic: Yes or no  Refer to General Cardiology: Yes or No  Follow up

## 2022-05-24 ENCOUNTER — Telehealth (HOSPITAL_COMMUNITY): Payer: Self-pay | Admitting: *Deleted

## 2022-05-24 ENCOUNTER — Encounter (HOSPITAL_COMMUNITY): Payer: Medicaid Other

## 2022-05-24 NOTE — Telephone Encounter (Signed)
Call attempted to confirm HV TOC appt 2 pm on 05/24/22. HIPPA appropriate VM left with callback number.    Earnestine Leys, BSN, Clinical cytogeneticist Only

## 2022-05-28 ENCOUNTER — Encounter: Payer: Self-pay | Admitting: Gastroenterology

## 2022-08-09 ENCOUNTER — Observation Stay (HOSPITAL_COMMUNITY): Payer: Medicaid Other

## 2022-08-09 ENCOUNTER — Emergency Department (HOSPITAL_COMMUNITY): Payer: Medicaid Other

## 2022-08-09 ENCOUNTER — Observation Stay (HOSPITAL_COMMUNITY)
Admission: EM | Admit: 2022-08-09 | Discharge: 2022-08-10 | Disposition: A | Discharge status: Home / Self Care | Payer: Medicaid Other | Attending: Internal Medicine | Admitting: Internal Medicine

## 2022-08-09 ENCOUNTER — Encounter (HOSPITAL_COMMUNITY): Payer: Self-pay

## 2022-08-09 ENCOUNTER — Other Ambulatory Visit: Payer: Self-pay

## 2022-08-09 DIAGNOSIS — N179 Acute kidney failure, unspecified: Secondary | ICD-10-CM | POA: Diagnosis not present

## 2022-08-09 DIAGNOSIS — Z79899 Other long term (current) drug therapy: Secondary | ICD-10-CM | POA: Insufficient documentation

## 2022-08-09 DIAGNOSIS — R0600 Dyspnea, unspecified: Secondary | ICD-10-CM

## 2022-08-09 DIAGNOSIS — R7401 Elevation of levels of liver transaminase levels: Secondary | ICD-10-CM | POA: Insufficient documentation

## 2022-08-09 DIAGNOSIS — I110 Hypertensive heart disease with heart failure: Secondary | ICD-10-CM | POA: Insufficient documentation

## 2022-08-09 DIAGNOSIS — R7989 Other specified abnormal findings of blood chemistry: Secondary | ICD-10-CM

## 2022-08-09 DIAGNOSIS — Z7982 Long term (current) use of aspirin: Secondary | ICD-10-CM | POA: Insufficient documentation

## 2022-08-09 DIAGNOSIS — J45909 Unspecified asthma, uncomplicated: Secondary | ICD-10-CM | POA: Insufficient documentation

## 2022-08-09 DIAGNOSIS — G9341 Metabolic encephalopathy: Secondary | ICD-10-CM | POA: Diagnosis not present

## 2022-08-09 DIAGNOSIS — I5043 Acute on chronic combined systolic (congestive) and diastolic (congestive) heart failure: Secondary | ICD-10-CM

## 2022-08-09 DIAGNOSIS — R059 Cough, unspecified: Secondary | ICD-10-CM | POA: Diagnosis not present

## 2022-08-09 DIAGNOSIS — Z91010 Allergy to peanuts: Secondary | ICD-10-CM | POA: Diagnosis not present

## 2022-08-09 DIAGNOSIS — R079 Chest pain, unspecified: Secondary | ICD-10-CM

## 2022-08-09 DIAGNOSIS — Z20822 Contact with and (suspected) exposure to covid-19: Secondary | ICD-10-CM | POA: Insufficient documentation

## 2022-08-09 DIAGNOSIS — I5041 Acute combined systolic (congestive) and diastolic (congestive) heart failure: Secondary | ICD-10-CM | POA: Diagnosis not present

## 2022-08-09 DIAGNOSIS — F1410 Cocaine abuse, uncomplicated: Secondary | ICD-10-CM | POA: Diagnosis not present

## 2022-08-09 DIAGNOSIS — R0789 Other chest pain: Secondary | ICD-10-CM | POA: Diagnosis not present

## 2022-08-09 DIAGNOSIS — R0602 Shortness of breath: Secondary | ICD-10-CM | POA: Diagnosis not present

## 2022-08-09 DIAGNOSIS — J811 Chronic pulmonary edema: Secondary | ICD-10-CM | POA: Diagnosis not present

## 2022-08-09 DIAGNOSIS — I2510 Atherosclerotic heart disease of native coronary artery without angina pectoris: Secondary | ICD-10-CM | POA: Insufficient documentation

## 2022-08-09 DIAGNOSIS — I509 Heart failure, unspecified: Secondary | ICD-10-CM

## 2022-08-09 DIAGNOSIS — I10 Essential (primary) hypertension: Secondary | ICD-10-CM

## 2022-08-09 LAB — CBC WITH DIFFERENTIAL/PLATELET
Abs Immature Granulocytes: 0.02 10*3/uL (ref 0.00–0.07)
Basophils Absolute: 0.1 10*3/uL (ref 0.0–0.1)
Basophils Relative: 1 %
Eosinophils Absolute: 0.1 10*3/uL (ref 0.0–0.5)
Eosinophils Relative: 1 %
HCT: 46 % (ref 39.0–52.0)
Hemoglobin: 14.3 g/dL (ref 13.0–17.0)
Immature Granulocytes: 0 %
Lymphocytes Relative: 26 %
Lymphs Abs: 1.8 10*3/uL (ref 0.7–4.0)
MCH: 29.7 pg (ref 26.0–34.0)
MCHC: 31.1 g/dL (ref 30.0–36.0)
MCV: 95.4 fL (ref 80.0–100.0)
Monocytes Absolute: 0.4 10*3/uL (ref 0.1–1.0)
Monocytes Relative: 6 %
Neutro Abs: 4.4 10*3/uL (ref 1.7–7.7)
Neutrophils Relative %: 66 %
Platelets: 284 10*3/uL (ref 150–400)
RBC: 4.82 MIL/uL (ref 4.22–5.81)
RDW: 15.1 % (ref 11.5–15.5)
WBC: 6.8 10*3/uL (ref 4.0–10.5)
nRBC: 0.4 % — ABNORMAL HIGH (ref 0.0–0.2)

## 2022-08-09 LAB — URINALYSIS, MICROSCOPIC (REFLEX)

## 2022-08-09 LAB — COMPREHENSIVE METABOLIC PANEL
ALT: 102 U/L — ABNORMAL HIGH (ref 0–44)
AST: 101 U/L — ABNORMAL HIGH (ref 15–41)
Albumin: 3.5 g/dL (ref 3.5–5.0)
Alkaline Phosphatase: 126 U/L (ref 38–126)
Anion gap: 10 (ref 5–15)
BUN: 13 mg/dL (ref 6–20)
CO2: 21 mmol/L — ABNORMAL LOW (ref 22–32)
Calcium: 9 mg/dL (ref 8.9–10.3)
Chloride: 108 mmol/L (ref 98–111)
Creatinine, Ser: 1.3 mg/dL — ABNORMAL HIGH (ref 0.61–1.24)
GFR, Estimated: >60 mL/min (ref >60)
Glucose, Bld: 106 mg/dL — ABNORMAL HIGH (ref 70–99)
Potassium: 3.8 mmol/L (ref 3.5–5.1)
Sodium: 139 mmol/L (ref 135–145)
Total Bilirubin: 1.9 mg/dL — ABNORMAL HIGH (ref 0.3–1.2)
Total Protein: 6.8 g/dL (ref 6.5–8.1)

## 2022-08-09 LAB — I-STAT CHEM 8, ED
BUN: 15 mg/dL (ref 6–20)
Calcium, Ion: 1.09 mmol/L — ABNORMAL LOW (ref 1.15–1.40)
Chloride: 106 mmol/L (ref 98–111)
Creatinine, Ser: 1.2 mg/dL (ref 0.61–1.24)
Glucose, Bld: 99 mg/dL (ref 70–99)
HCT: 47 % (ref 39.0–52.0)
Hemoglobin: 16 g/dL (ref 13.0–17.0)
Potassium: 3.8 mmol/L (ref 3.5–5.1)
Sodium: 142 mmol/L (ref 135–145)
TCO2: 22 mmol/L (ref 22–32)

## 2022-08-09 LAB — I-STAT ARTERIAL BLOOD GAS, ED
Acid-base deficit: 5 mmol/L — ABNORMAL HIGH (ref 0.0–2.0)
Bicarbonate: 19.5 mmol/L — ABNORMAL LOW (ref 20.0–28.0)
Calcium, Ion: 1.19 mmol/L (ref 1.15–1.40)
HCT: 42 % (ref 39.0–52.0)
Hemoglobin: 14.3 g/dL (ref 13.0–17.0)
O2 Saturation: 99 %
Patient temperature: 98.1
Potassium: 3.6 mmol/L (ref 3.5–5.1)
Sodium: 141 mmol/L (ref 135–145)
TCO2: 21 mmol/L — ABNORMAL LOW (ref 22–32)
pCO2 arterial: 32.1 mmHg (ref 32–48)
pH, Arterial: 7.391 (ref 7.35–7.45)
pO2, Arterial: 135 mmHg — ABNORMAL HIGH (ref 83–108)

## 2022-08-09 LAB — TROPONIN I (HIGH SENSITIVITY)
Troponin I (High Sensitivity): 51 ng/L — ABNORMAL HIGH (ref <18)
Troponin I (High Sensitivity): 44 ng/L — ABNORMAL HIGH (ref <18)

## 2022-08-09 LAB — URINALYSIS, ROUTINE W REFLEX MICROSCOPIC
Bilirubin Urine: NEGATIVE
Glucose, UA: NEGATIVE mg/dL
Ketones, ur: NEGATIVE mg/dL
Leukocytes,Ua: NEGATIVE
Nitrite: NEGATIVE
Protein, ur: NEGATIVE mg/dL
Specific Gravity, Urine: 1.015 (ref 1.005–1.030)
pH: 6 (ref 5.0–8.0)

## 2022-08-09 LAB — RAPID URINE DRUG SCREEN, HOSP PERFORMED
Amphetamines: NOT DETECTED
Barbiturates: NOT DETECTED
Benzodiazepines: NOT DETECTED
Cocaine: POSITIVE — AB
Opiates: NOT DETECTED
Tetrahydrocannabinol: NOT DETECTED

## 2022-08-09 LAB — RESP PANEL BY RT-PCR (FLU A&B, COVID) ARPGX2
Influenza A by PCR: NEGATIVE
Influenza B by PCR: NEGATIVE
SARS Coronavirus 2 by RT PCR: NEGATIVE

## 2022-08-09 LAB — BRAIN NATRIURETIC PEPTIDE: B Natriuretic Peptide: 537.9 pg/mL — ABNORMAL HIGH (ref 0.0–100.0)

## 2022-08-09 LAB — ETHANOL: Alcohol, Ethyl (B): <10 mg/dL (ref <10)

## 2022-08-09 MED ORDER — IPRATROPIUM-ALBUTEROL 0.5-2.5 (3) MG/3ML IN SOLN
3.0000 mL | Freq: Once | RESPIRATORY_TRACT | Status: AC
  Administered 2022-08-09: 3 mL via RESPIRATORY_TRACT
  Filled 2022-08-09: qty 3

## 2022-08-09 MED ORDER — ATORVASTATIN CALCIUM 40 MG PO TABS: 80.0000 mg | ORAL_TABLET | Freq: Every day | ORAL | Status: DC

## 2022-08-09 MED ORDER — FUROSEMIDE 10 MG/ML IJ SOLN
20.0000 mg | Freq: Once | INTRAMUSCULAR | Status: AC
  Administered 2022-08-09: 20 mg via INTRAVENOUS
  Filled 2022-08-09: qty 2

## 2022-08-09 MED ORDER — FUROSEMIDE 10 MG/ML IJ SOLN
40.0000 mg | Freq: Two times a day (BID) | INTRAMUSCULAR | Status: DC
  Administered 2022-08-09 – 2022-08-10 (×2): 40 mg via INTRAVENOUS
  Filled 2022-08-09 (×2): qty 4

## 2022-08-09 MED ORDER — ASPIRIN 81 MG PO CHEW
324.0000 mg | CHEWABLE_TABLET | Freq: Once | ORAL | Status: DC
  Filled 2022-08-09: qty 4

## 2022-08-09 MED ORDER — ENOXAPARIN SODIUM 40 MG/0.4ML IJ SOSY
40.0000 mg | PREFILLED_SYRINGE | Freq: Every day | INTRAMUSCULAR | Status: DC
  Administered 2022-08-10: 40 mg via SUBCUTANEOUS
  Filled 2022-08-09: qty 0.4

## 2022-08-09 MED ORDER — ICOSAPENT ETHYL 1 G PO CAPS
2.0000 g | ORAL_CAPSULE | Freq: Two times a day (BID) | ORAL | Status: DC
  Administered 2022-08-09 – 2022-08-10 (×2): 2 g via ORAL
  Filled 2022-08-09 (×3): qty 2

## 2022-08-09 MED ORDER — LORAZEPAM 2 MG/ML IJ SOLN
0.5000 mg | Freq: Once | INTRAMUSCULAR | Status: AC
  Administered 2022-08-09: 0.5 mg via INTRAVENOUS
  Filled 2022-08-09: qty 1

## 2022-08-09 MED ORDER — TAMSULOSIN HCL 0.4 MG PO CAPS: 0.4000 mg | ORAL_CAPSULE | Freq: Every day | ORAL | Status: DC

## 2022-08-09 MED ORDER — ALBUTEROL SULFATE (2.5 MG/3ML) 0.083% IN NEBU: 3.0000 mL | INHALATION_SOLUTION | Freq: Four times a day (QID) | RESPIRATORY_TRACT | Status: DC | PRN

## 2022-08-09 MED ORDER — AMLODIPINE BESYLATE 5 MG PO TABS
10.0000 mg | ORAL_TABLET | Freq: Every day | ORAL | Status: DC
  Administered 2022-08-10: 10 mg via ORAL
  Filled 2022-08-09: qty 2

## 2022-08-09 NOTE — ED Notes (Signed)
Unable to obtain temp due to patient rapid breathing and not keeping his mouth shut.  Explained his oxygen levels are good right now and I dont want to place him on oxygen without knowing his hx as too much oxygen can decreased respiratory drive and wanting a MD to see him first.

## 2022-08-09 NOTE — H&P (Cosign Needed Addendum)
Date: 08/09/2022               Patient Name:  Kenneth Long MRN: [id number]  DOB: 10/23/1966 Age / Sex: 56 y.o., male   PCP: Mack Hook, MD         Medical Service: Internal Medicine Teaching Service         Attending Physician: Dr. Lucious Groves, DO    First Contact: Dr. Eligha Bridegroom, DO Pager: [phone]  Second Contact: Dr. Sanjuana Letters, DO Pager: [phone]       After Hours (After 5p/  First Contact Pager: [phone]  weekends / holidays): Second Contact Pager: [phone]   Chief Complaint: Chest pain  History of Present Illness:  Kenneth Long is a 56 year old person living with systolic and diastolic heart failure (last TTE 04/2022 EF 08-14% grade II diastolic dysfunction), CAD s/p DES to OM in 2017, HTN. HLD, asthma, substance use disorder-cocaine who presents today for chest pain and shortness of breath. Difficult to obtain history due to altered mental status after Ativan and shortness of breath.  Of history obtained via chart review and from cousin who is in the room with him.  Per cousin patient with worsening shortness of breath that started 2 days ago.  This is worse with laying down he has not been able to sleep for the past couple of days due to this.  He endorses only productive cough to the ED provider.  Also noticed midsternal chest pain radiating to the left neck and arm intermittently.  Cousin notes that previously patient will become more tachypneic when the chest pain came on and then became more sleepy.  Currently patient denies any active chest pain.    He reported to ED provider that he has not been taking his medications for the last couple weeks due to running out.  He denied fever, nausea, vomiting, and dysuria.  Meds:  ASA 81 mg daily Albuterol PRN Amlodipine Atorvastatin 80 mg daily Lisinopril-hydrochlorothiazide Nitroglycerin SL 0.4 prn tamsulosin 0.'4mg'$  qhs Vascepa 2 g twice daily  Allergies: Allergies as of 08/09/2022 -  Review Complete 08/09/2022  Allergen Reaction Noted   Peanut-containing drug products Anaphylaxis and Swelling 12/30/2011   Shellfish allergy Anaphylaxis and Swelling 12/30/2011   Other  07/26/2019   Past Medical History:  Diagnosis Date   Allergy    SEASONAL   Arthritis    Asthma    Colon adenomas 08/07/2019   Dr. Ardis Hughs 5 Tubular adenomas without high grade dysplasia.   Coronary artery disease    Dyspnea    "sometimes" with exertion    Headache    Heart murmur    Hyperlipidemia    Hypertension    Sleep apnea    no CPAP   ST elevation (STEMI) myocardial infarction involving left circumflex coronary artery ([place]) 03/16/2016   DES CFX   Umbilical hernia     Family History: Hypertension in mother, father, and 2 sisters, sarcoidosis in mother  Social History: Retired per cousin, lives with wife and daughter, independent in ADLs and iADLs. Prior history of cocaine use, denied to ED provider, reports he drinks alcohol occasional last drink 1 week ago.  Review of Systems: A complete ROS was negative except as per HPI.   Physical Exam: Blood pressure (!) 138/119, pulse 99, temperature 98.1 F (36.7 C), resp. rate (!) 22, height '5\' 9"'$  (1.753 m), weight 74.8 kg, SpO2 96 %. Constitutional: Restless intermittently sitting up versus lying down in bed with  increased work of breathing.  Lethargic and intermittently listing to the side due to this. HENT: Normocephalic and atraumatic, conjunctiva normal, moist mucous membranes Cardiovascular: Normal rate, regular rhythm, systolic murmur best heard at the left midclavicular line, no rubs.  Distal pulses intact.  JVD to the mid neck Respiratory: Increased work of breathing, tachypneic, saturating well on 2 L nasal cannula, bibasilar crackles GI: Somewhat distended, no tenderness, no masses, only active bowel sounds Musculoskeletal: Normal bulk and tone.  No peripheral edema noted. Neurological: lethargic, currently falling asleep during exam  unable to maintain attention, moving all extremities purposefully, not called following commands. alert to name, occasionally will answer yes and no questions Skin: Warm and dry.  No rashes or obvious lesions Psychiatric: Restless sitting up and out of bed. lethargic  EKG: personally reviewed my interpretation is HR 92, PVC, chronic T wave inversion in II, III, biatrial enlargment no acute ischemic changes  CXR: personally reviewed my interpretation is: Decreased lung volumes, pulmonary edema, cardiomegaly, no pleural effusions  Assessment & Plan by Problem: Principal Problem:   Acute exacerbation of CHF (congestive heart failure) (HCC)  Systolic and diastolic heart failure exacerbation Patient presenting with worsening orthopnea and intermittent chest pain. Last echo 04/2022 EF 40-45% grade II DD severe LA dilation, MR. JVD and abdominal distention on exam. Elevated BNP with pulmonary edema on CXR. Troponin 51. No acute changes on EKG. Received IV lasix in ED with 300 mL urine output over about 1 hour. Chest pain likely due to vasospasm from cocaine use.  - IV lasix 20 mg, if not having good urin output this afternoon will give additional IV 40 mg - ACE held in setting of AKI, will need to discuss GDMT once more awake - TTE ordered - strict I/Os, weights - telemetry  - monitor electrolytes  Acute metabolic encephalopathy  Substance use disorder Patient lethargic and restless on exam. UDS positive for cocaine. Also had a dose of ativan in ED which may also be contributing. ABG without hypercardia. Suspect AMS likely in setting of substance use.  - Follow up ethanol level, does not appear to have history of admission for etoh withdrawal - avoid further sedating medications  AKI Cr elevated to 1.3 today. He denies any urinary symptoms. Likely in setting of heart failure - hold home lisinopril-hctz  - monitor urine output and kidney function  Elevated liver enzymes AST and ALT elevated  today. May be related to HF exacerbation. -Check hepatitis B and C - hold statin - monitor CMP  CAD HTN S/p DES to OM in 2017. No acute ischemic changes on EKG today. Troponin 51 in ED today likely due to demand. - Follow up repeat troponin  - Continue ASA 81 mg, atorvastatin 80 mg daily  Asthma -albuterol PRN  Dispo: Admit patient to Observation with expected length of stay less than 2 midnights.  Signed: Iona Beard, MD 08/09/2022, 12:10 PM  Pager: [phone] After 5pm on weekdays and 1pm on weekends: On Call pager: [phone]

## 2022-08-09 NOTE — ED Provider Notes (Signed)
[facility] EMERGENCY DEPARTMENT Provider Note   CSN: [id number] Arrival date & time: 08/09/22  0845     History  Chief Complaint  Patient presents with   Chest Pain    Kenneth Long is a 56 y.o. male.  56 year old male with prior medical history as detailed below presents for evaluation.  Patient complains of midsternal chest discomfort that began yesterday morning.  Pain was constant throughout the day yesterday.  Pain was increased this morning so he decided come to the ED for evaluation.  He complains of associated cough with mild production of sputum.  He complains of mild shortness of breath associate with the symptoms.  Of note, patient reports that he ran out of all of his medications about a week ago.  He reports that he has not taken any medications in the last week.  Dyspnea is worse with laying flat.  Denies associated fever, nausea, vomiting, back pain, other complaint.  He denies recent or current cocaine use.  The history is provided by the patient and medical records.  Chest Pain Pain location:  Substernal area Pain quality: aching and pressure   Pain radiates to:  Does not radiate      Home Medications Prior to Admission medications   Medication Sig Start Date End Date Taking? Authorizing Provider  albuterol (VENTOLIN HFA) 108 (90 Base) MCG/ACT inhaler Inhale 2 puffs into the lungs every 6 (six) hours as needed for wheezing or shortness of breath. 01/30/20   Mack Hook, MD  amLODipine (NORVASC) 10 MG tablet Take 1 tablet (10 mg total) by mouth daily. 05/13/22   Dwyane Dee, MD  aspirin EC 81 MG tablet Take 1 tablet (81 mg total) by mouth daily. 12/26/17   [place], Jemiah Cuadra M, MD  atorvastatin (LIPITOR) 80 MG tablet Take 1 tablet (80 mg total) by mouth daily. 05/12/20   Mack Hook, MD  lisinopril-hydrochlorothiazide (ZESTORETIC) 20-25 MG tablet Take 1 tablet by mouth once daily. 05/13/22   Dwyane Dee, MD   nitroGLYCERIN (NITROSTAT) 0.4 MG SL tablet Place 1 tablet (0.4 mg total) under the tongue every 5 (five) minutes as needed for chest pain. 02/06/19   Mack Hook, MD  tamsulosin (FLOMAX) 0.4 MG CAPS capsule Take 1 capsule by mouth at bedtime. 05/13/22   Dwyane Dee, MD  VASCEPA 1 g capsule Take 2 capsules by mouth twice daily Patient not taking: Reported on 05/12/2022 10/30/20   Mack Hook, MD      Allergies    Peanut-containing drug products, Shellfish allergy, and Other    Review of Systems   Review of Systems  Cardiovascular:  Positive for chest pain.  All other systems reviewed and are negative.   Physical Exam Updated Vital Signs BP 97/64 (BP Location: Right Arm)   Pulse 73   Resp (!) 21   Ht '5\' 9"'$  (1.753 m)   Wt 74.8 kg   SpO2 99%   BMI 24.37 kg/m  Physical Exam Vitals and nursing note reviewed.  Constitutional:      General: He is not in acute distress.    Appearance: Normal appearance. He is well-developed.  HENT:     Head: Normocephalic and atraumatic.  Eyes:     Conjunctiva/sclera: Conjunctivae normal.     Pupils: Pupils are equal, round, and reactive to light.  Cardiovascular:     Rate and Rhythm: Normal rate and regular rhythm.     Heart sounds: Normal heart sounds.     Comments: Mild jugular  vein distention Pulmonary:     Effort: Pulmonary effort is normal. No respiratory distress.     Breath sounds: Examination of the right-lower field reveals decreased breath sounds. Examination of the left-lower field reveals decreased breath sounds. Decreased breath sounds present.  Abdominal:     General: There is no distension.     Palpations: Abdomen is soft.     Tenderness: There is no abdominal tenderness.  Musculoskeletal:        General: No deformity. Normal range of motion.     Cervical back: Normal range of motion and neck supple.  Skin:    General: Skin is warm and dry.  Neurological:     General: No focal deficit present.     Mental  Status: He is alert and oriented to person, place, and time.     ED Results / Procedures / Treatments   Labs (all labs ordered are listed, but only abnormal results are displayed) Labs Reviewed  I-STAT CHEM 8, ED - Abnormal; Notable for the following components:      Result Value   Calcium, Ion 1.09 (*)    All other components within normal limits  RESP PANEL BY RT-PCR (FLU A&B, COVID) ARPGX2  COMPREHENSIVE METABOLIC PANEL  CBC WITH DIFFERENTIAL/PLATELET  RAPID URINE DRUG SCREEN, HOSP PERFORMED  BRAIN NATRIURETIC PEPTIDE  TROPONIN I (HIGH SENSITIVITY)    EKG EKG Interpretation  Date/Time:  Monday August 09 2022 09:35:56 EDT Ventricular Rate:  92 PR Interval:  142 QRS Duration: 96 QT Interval:  399 QTC Calculation: 494 R Axis:   213 Text Interpretation: Right and left arm electrode reversal, interpretation assumes no reversal Sinus rhythm Ventricular premature complex Biatrial enlargement Right axis deviation Consider left ventricular hypertrophy Probable lateral infarct, age indeterminate Nonspecific T abnormalities, lateral leads Borderline prolonged QT interval Confirmed by Dene Gentry [phone]) on 08/09/2022 9:37:39 AM  Radiology No results found.  Procedures Procedures    Medications Ordered in ED Medications  ipratropium-albuterol (DUONEB) 0.5-2.5 (3) MG/3ML nebulizer solution 3 mL (has no administration in time range)  LORazepam (ATIVAN) injection 0.5 mg (has no administration in time range)    ED Course/ Medical Decision Making/ A&P                           Medical Decision Making Amount and/or Complexity of Data Reviewed Labs: ordered. Radiology: ordered.  Risk Prescription drug management.    Medical Screen Complete  This patient presented to the ED with complaint of chest pain, dyspnea, reported noncompliance with meds.  This complaint involves an extensive number of treatment options. The initial differential diagnosis includes, but is not  limited to, CHF exacerbation, ACS, pneumonia, metabolic abnormality, etc.  This presentation is: Acute, Chronic, Self-Limited, Previously Undiagnosed, Uncertain Prognosis, Complicated, Systemic Symptoms, and Threat to Life/Bodily Function  56 year old with medical history significant for CAD, CHF, asthma, hyperlipidemia, hypertension, systolic and diastolic congestive heart failure, OSA resenting with complaint of shortness of breath and chest pain.  Patient reports that he has been noncompliant with all meds for the last week.  He reports he ran out.  History and exam are suggestive of likely CHF exacerbation.  Patient's chest discomfort is somewhat atypical with onset yesterday and constant pain for more than 24 hours.  Initial EKG is without evidence of acute ischemia.  Initial troponin is 51.  Chest x-ray is suggestive of mild to moderate CHF.  Patient denies recent cocaine use or other substance abuse.  Patient treated with IV Lasix, DuoNeb treatment, Ativan.  Patient would benefit from admission for further work-up and treatment.   Teaching service made aware of case and will evaluate for admission.    Additional history obtained:  External records from outside sources obtained and reviewed including prior ED visits and prior Inpatient records.    Lab Tests:  I ordered and personally interpreted labs.  The pertinent results include: CBC, CMP, troponin, BNP, i-STAT Chem-8, COVID, flu   Imaging Studies ordered:  I ordered imaging studies including chest x-ray I independently visualized and interpreted obtained imaging which showed CHF I agree with the radiologist interpretation.   Cardiac Monitoring:  The patient was maintained on a cardiac monitor.  I personally viewed and interpreted the cardiac monitor which showed an underlying rhythm of: NSR   Medicines ordered:  I ordered medication including Lasix, Ativan, DuoNeb for CHF, respiratory  distress Reevaluation of the patient after these medicines showed that the patient: improved  Problem List / ED Course:  Dyspnea, suspected CHF exacerbation, medication noncompliance   Reevaluation:  After the interventions noted above, I reevaluated the patient and found that they have: improved  Disposition:  After consideration of the diagnostic results and the patients response to treatment, I feel that the patent would benefit from admission.          Final Clinical Impression(s) / ED Diagnoses Final diagnoses:  Chest pain, unspecified type  Dyspnea, unspecified type    Rx / DC Orders ED Discharge Orders     None         Valarie Merino, MD 08/09/22 1100

## 2022-08-09 NOTE — ED Triage Notes (Signed)
Patient complains of chest pain that started yesterday that radiates to back neck arm. Patient tachypneic.  Congested cough.

## 2022-08-10 ENCOUNTER — Observation Stay (HOSPITAL_BASED_OUTPATIENT_CLINIC_OR_DEPARTMENT_OTHER): Payer: Medicaid Other

## 2022-08-10 DIAGNOSIS — G9341 Metabolic encephalopathy: Secondary | ICD-10-CM | POA: Diagnosis not present

## 2022-08-10 DIAGNOSIS — I110 Hypertensive heart disease with heart failure: Secondary | ICD-10-CM | POA: Diagnosis not present

## 2022-08-10 DIAGNOSIS — F1410 Cocaine abuse, uncomplicated: Secondary | ICD-10-CM | POA: Diagnosis not present

## 2022-08-10 DIAGNOSIS — I5041 Acute combined systolic (congestive) and diastolic (congestive) heart failure: Secondary | ICD-10-CM

## 2022-08-10 DIAGNOSIS — J45909 Unspecified asthma, uncomplicated: Secondary | ICD-10-CM | POA: Diagnosis not present

## 2022-08-10 DIAGNOSIS — I2510 Atherosclerotic heart disease of native coronary artery without angina pectoris: Secondary | ICD-10-CM | POA: Diagnosis not present

## 2022-08-10 DIAGNOSIS — R7401 Elevation of levels of liver transaminase levels: Secondary | ICD-10-CM | POA: Diagnosis not present

## 2022-08-10 DIAGNOSIS — I5043 Acute on chronic combined systolic (congestive) and diastolic (congestive) heart failure: Secondary | ICD-10-CM | POA: Diagnosis not present

## 2022-08-10 DIAGNOSIS — Z7982 Long term (current) use of aspirin: Secondary | ICD-10-CM | POA: Diagnosis not present

## 2022-08-10 DIAGNOSIS — Z91010 Allergy to peanuts: Secondary | ICD-10-CM | POA: Diagnosis not present

## 2022-08-10 DIAGNOSIS — Z79899 Other long term (current) drug therapy: Secondary | ICD-10-CM | POA: Diagnosis not present

## 2022-08-10 DIAGNOSIS — Z20822 Contact with and (suspected) exposure to covid-19: Secondary | ICD-10-CM | POA: Diagnosis not present

## 2022-08-10 DIAGNOSIS — N179 Acute kidney failure, unspecified: Secondary | ICD-10-CM | POA: Diagnosis not present

## 2022-08-10 LAB — ECHOCARDIOGRAM COMPLETE
Calc EF: 18.6 %
Height: 71 in
MV M vel: 5 m/s
MV Peak grad: 100 mmHg
Radius: 0.5 cm
S' Lateral: 5.7 cm
Single Plane A2C EF: 19.9 %
Single Plane A4C EF: 15.9 %
Weight: 2656.1 oz

## 2022-08-10 LAB — COMPREHENSIVE METABOLIC PANEL
ALT: 222 U/L — ABNORMAL HIGH (ref 0–44)
AST: 200 U/L — ABNORMAL HIGH (ref 15–41)
Albumin: 3.4 g/dL — ABNORMAL LOW (ref 3.5–5.0)
Alkaline Phosphatase: 111 U/L (ref 38–126)
Anion gap: 9 (ref 5–15)
BUN: 19 mg/dL (ref 6–20)
CO2: 19 mmol/L — ABNORMAL LOW (ref 22–32)
Calcium: 8.7 mg/dL — ABNORMAL LOW (ref 8.9–10.3)
Chloride: 109 mmol/L (ref 98–111)
Creatinine, Ser: 1.19 mg/dL (ref 0.61–1.24)
GFR, Estimated: >60 mL/min (ref >60)
Glucose, Bld: 116 mg/dL — ABNORMAL HIGH (ref 70–99)
Potassium: 3.6 mmol/L (ref 3.5–5.1)
Sodium: 137 mmol/L (ref 135–145)
Total Bilirubin: 1.1 mg/dL (ref 0.3–1.2)
Total Protein: 6.2 g/dL — ABNORMAL LOW (ref 6.5–8.1)

## 2022-08-10 LAB — CBC
HCT: 42.7 % (ref 39.0–52.0)
Hemoglobin: 14.3 g/dL (ref 13.0–17.0)
MCH: 30.6 pg (ref 26.0–34.0)
MCHC: 33.5 g/dL (ref 30.0–36.0)
MCV: 91.4 fL (ref 80.0–100.0)
Platelets: 269 10*3/uL (ref 150–400)
RBC: 4.67 MIL/uL (ref 4.22–5.81)
RDW: 14.9 % (ref 11.5–15.5)
WBC: 7.8 10*3/uL (ref 4.0–10.5)
nRBC: 0.4 % — ABNORMAL HIGH (ref 0.0–0.2)

## 2022-08-10 LAB — HEPATITIS B CORE ANTIBODY, IGM: Hep B C IgM: NONREACTIVE

## 2022-08-10 LAB — HEPATITIS B SURFACE ANTIGEN: Hepatitis B Surface Ag: NONREACTIVE

## 2022-08-10 MED ORDER — PERFLUTREN LIPID MICROSPHERE
1.0000 mL | INTRAVENOUS | Status: AC | PRN
  Administered 2022-08-10: 2 mL via INTRAVENOUS

## 2022-08-10 MED ORDER — ATORVASTATIN CALCIUM 80 MG PO TABS: 80.0000 mg | ORAL_TABLET | Freq: Every day | ORAL | 0 refills | Status: AC

## 2022-08-10 MED ORDER — AMLODIPINE BESYLATE 10 MG PO TABS: 10.0000 mg | ORAL_TABLET | Freq: Every day | ORAL | 0 refills | Status: AC

## 2022-08-10 MED ORDER — TAMSULOSIN HCL 0.4 MG PO CAPS: ORAL_CAPSULE | ORAL | 0 refills | Status: AC

## 2022-08-10 MED ORDER — VENTOLIN HFA 108 (90 BASE) MCG/ACT IN AERS: 2.0000 | INHALATION_SPRAY | Freq: Four times a day (QID) | RESPIRATORY_TRACT | 0 refills | Status: AC | PRN

## 2022-08-10 MED ORDER — ASPIRIN EC 81 MG PO TBEC: 81.0000 mg | DELAYED_RELEASE_TABLET | Freq: Every day | ORAL | 0 refills | Status: AC

## 2022-08-10 MED ORDER — FUROSEMIDE 10 MG/ML IJ SOLN
40.0000 mg | Freq: Once | INTRAMUSCULAR | Status: AC
  Administered 2022-08-10: 40 mg via INTRAVENOUS
  Filled 2022-08-10: qty 4

## 2022-08-10 MED ORDER — NITROGLYCERIN 0.4 MG SL SUBL: 0.4000 mg | SUBLINGUAL_TABLET | SUBLINGUAL | 0 refills | Status: AC | PRN

## 2022-08-10 MED ORDER — LISINOPRIL-HYDROCHLOROTHIAZIDE 20-25 MG PO TABS: ORAL_TABLET | ORAL | 0 refills | Status: AC

## 2022-08-10 MED ORDER — VASCEPA 1 G PO CAPS: ORAL_CAPSULE | ORAL | 0 refills | Status: AC

## 2022-08-10 NOTE — TOC Progression Note (Signed)
Transition of Care [facility]) - Progression Note    Patient Details  Name: Kenneth Long MRN: [id number] Date of Birth: 11/20/1966  [facility] CM/SW Contact  Zenon Mayo, RN Phone Number: 08/10/2022, 10:50 AM  Clinical Narrative:     from home, chest pain, sob, EF 40-45, conts on IV lasix bid. TOC following.        Expected Discharge Plan and Services                                                 Social Determinants of Health (SDOH) Interventions    Readmission Risk Interventions     No data to display

## 2022-08-10 NOTE — Progress Notes (Signed)
Pt is alert and oriented X4 refusing the use of bed alarm, education given to use call bell when getting up but continues to be not compliant with safety measures. Charge nurse made aware.

## 2022-08-10 NOTE — Progress Notes (Signed)
  Echocardiogram 2D Echocardiogram has been performed.  Kenneth Long 08/10/2022, 2:22 PM

## 2022-08-10 NOTE — Hospital Course (Addendum)
Acute exacerbation of HFrEF Patient presenting with worsening orthopnea and intermittent chest pain. He reports running out of his home meds for past 1-2 weeks. He takes lisinopril-HCTZ 20-25 mg daily. Last echo 04/2022 EF 40-45% grade II DD severe LA dilation, MR. BNP 537 compared to 185 in May. CXR showed pulmonary edema. He reports he noticed abdominal distention when volume overloaded. Started him on lasix and had urine output of 3475 ml. Repeat echocardiogram showed 30-35% EF with global hypokinesis, akinesis of inferior/inferolateral wall which is unchanged from previous echocardiogram in May. Chest pain is likely cocaine induced vasospasms. On day of discharge, he is alert and denies shortness of breath or chest pain. He feels less fluid overloaded after diuresis.   Substance use disorder Patient lethargic and restless on exam. UDS positive for cocaine. EtOH negative. ABG without hypercarbia. Suspect AMS and chest pain likely in setting of substance use. Similar presentation in prior hospitalizations where he is drowsy and lethargic. His mental status improved and was more alert and awake at day of discharge.    AKI Creatinine elevated to 1.3 at admission. He denies any urinary symptoms. Likely from CHF exacerbation. Diuresed with good urine output. Creatinine improved over hospital course.   Elevated liver enzymes AST and ALT increasing during hospital course. Last AST 200 and ALT 222. He reports occasional alcohol use with last drink 1 week ago. May be related to CHF exacerbation. Hep B serology negative. Hep C pending. Will need outpatient follow up with repeat CMP.   CAD s/p DES to OM (2017) No acute ischemic changes on EKG today. He is on home ASA 81 mg and atorvastatin 80 mg daily.

## 2022-08-10 NOTE — Discharge Instructions (Addendum)
You were hospitalized for your worsening shortness of breath due to acute heart failure exacerbation. We gave you a diuretic to help reduce the excess fluid in your body. Your breathing improved and less drowsy. Please make sure to go to your follow up appointments with Cardiology and our clinic. Thank you for allowing [place] to be part of your care.   We arranged for you to follow up at:  -[facility] on 8/30 at 3:00pm  -[place] in 2 weeks with Dr. Doug Sou office (Phone #: [phone])  Please make sure to go to your follow up appointments. You can call [facility] about medication refills.   Please call our clinic if you have any questions or concerns, we may be able to help and keep you from a long and expensive emergency room wait. Our clinic and after hours phone number is [phone], the best time to call is Monday through Friday 9 am to 4 pm but there is always someone available 24/7 if you have an emergency. If you need medication refills please notify your pharmacy one week in advance and they will send [place] a request.

## 2022-08-10 NOTE — Discharge Summary (Signed)
Name: Kenneth Long MRN: [id number] DOB: Nov 18, 1966 56 y.o. PCP: Mack Hook, MD  Date of Admission: 08/09/2022  8:47 AM Date of Discharge: 08/10/2022 Attending Physician: Dr. Angelia Mould  Discharge Diagnosis: Principal Problem:   Acute exacerbation of CHF (congestive heart failure) ([place]) Active Problems:   Cocaine abuse [facility])    Discharge Medications: Allergies as of 08/10/2022       Reactions   Peanut-containing Drug Products Anaphylaxis, Swelling   Shellfish Allergy Anaphylaxis, Swelling   Lexiscan [regadenoson] Other (See Comments)   Via IV- "tht iritation with a myocardial perf study 09-13-2018"        Medication List     TAKE these medications    amLODipine 10 MG tablet Commonly known as: NORVASC Take 1 tablet (10 mg total) by mouth daily.   aspirin EC 81 MG tablet Take 1 tablet (81 mg total) by mouth daily.   atorvastatin 80 MG tablet Commonly known as: LIPITOR Take 1 tablet (80 mg total) by mouth daily.   ibuprofen 200 MG tablet Commonly known as: ADVIL Take 200 mg by mouth every 6 (six) hours as needed for mild pain or moderate pain.   lisinopril-hydrochlorothiazide 20-25 MG tablet Commonly known as: ZESTORETIC Take 1 tablet by mouth once daily.   nitroGLYCERIN 0.4 MG SL tablet Commonly known as: NITROSTAT Place 1 tablet (0.4 mg total) under the tongue every 5 (five) minutes as needed for chest pain.   tamsulosin 0.4 MG Caps capsule Commonly known as: FLOMAX Take 1 capsule by mouth at bedtime.   Vascepa 1 g capsule Generic drug: icosapent Ethyl Take 2 capsules by mouth twice daily   Ventolin HFA 108 (90 Base) MCG/ACT inhaler Generic drug: albuterol Inhale 2 puffs into the lungs every 6 (six) hours as needed for wheezing or shortness of breath.        Disposition and follow-up:   Mr.[name] was discharged from [facility] in Good condition.  At the hospital follow up visit please address:  1.   Follow-up:  a. Acute exacerbation CHF: check for symptoms, taking lisinopril-HCTZ, following up with cardiology    b. AKI: repeat CMP   c. Elevated Transaminases: repeat CMP   2.  Labs / imaging needed at time of follow-up: CMP  3.  Pending labs/ test needing follow-up: HCV  4.  Medication Changes: none  Follow-up Appointments: -[facility] on 8/30 at 3pm -Cardiology at Dr. Doug Sou office in 2 weeks  Hospital Course by problem list: Acute exacerbation of HFrEF Patient presenting with worsening orthopnea and intermittent chest pain. He reports running out of his home meds for past 1-2 weeks. He takes lisinopril-HCTZ 20-25 mg daily. Last echo 04/2022 EF 40-45% grade II DD severe LA dilation, MR. BNP 537 compared to 185 in May. CXR showed pulmonary edema. He reports he noticed abdominal distention when volume overloaded. Started him on lasix and had urine output of 3475 ml. Repeat echocardiogram showed 30-35% EF with global hypokinesis, akinesis of inferior/inferolateral wall which is unchanged from previous echocardiogram in May. Chest pain is likely cocaine induced vasospasms. On day of discharge, he is alert and denies shortness of breath or chest pain. He feels less fluid overloaded after diuresis.   Substance use disorder Patient lethargic and restless on exam. UDS positive for cocaine. EtOH negative. ABG without hypercarbia. Suspect AMS and chest pain likely in setting of substance use. Similar presentation in prior hospitalizations where he is drowsy and lethargic. His mental status improved and was more  alert and awake at day of discharge.    AKI Creatinine elevated to 1.3 at admission. He denies any urinary symptoms. Likely from CHF exacerbation. Diuresed with good urine output. Creatinine improved over hospital course.   Elevated liver enzymes AST and ALT increasing during hospital course. Last AST 200 and ALT 222. He reports occasional alcohol use with last drink 1 week ago. May be  related to CHF exacerbation. Hep B serology negative. Hep C pending. Will need outpatient follow up with repeat CMP.   CAD s/p DES to OM (2017) No acute ischemic changes on EKG today. He is on home ASA 81 mg and atorvastatin 80 mg daily.    Discharge Subjective: Patient reports improvement with shortness of breath and chest pain. He is more alert. He has been voiding more with lasix diuresis  Discharge Exam:   BP (!) 144/102   Pulse 96   Temp 97.9 F (36.6 C) (Oral)   Resp 20   Ht '5\' 11"'$  (1.803 m)   Wt 75.3 kg   SpO2 96%   BMI 23.15 kg/m  Constitutional: alert, laying in bed comfortably, in no acute distress HENT: normocephalic atraumatic Neck: supple Cardiovascular: regular rate and rhythm, holosystolic murmur heard at left midclavicular line Pulmonary/Chest: normal work of breathing on room air MSK: normal bulk and tone Neurological: alert & oriented x 3 Skin: warm and dry Psych: normal mood and behavior   Pertinent Labs, Studies, and Procedures:     Latest Ref Rng & Units 08/10/2022    4:11 AM 08/09/2022    1:17 PM 08/09/2022    9:37 AM  CBC  WBC 4.0 - 10.5 K/uL 7.8     Hemoglobin 13.0 - 17.0 g/dL 14.3  14.3  16.0   Hematocrit 39.0 - 52.0 % 42.7  42.0  47.0   Platelets 150 - 400 K/uL 269          Latest Ref Rng & Units 08/10/2022    4:11 AM 08/09/2022    1:17 PM 08/09/2022    9:37 AM  CMP  Glucose 70 - 99 mg/dL 116   99   BUN 6 - 20 mg/dL 19   15   Creatinine 0.61 - 1.24 mg/dL 1.19   1.20   Sodium 135 - 145 mmol/L 137  141  142   Potassium 3.5 - 5.1 mmol/L 3.6  3.6  3.8   Chloride 98 - 111 mmol/L 109   106   CO2 22 - 32 mmol/L 19     Calcium 8.9 - 10.3 mg/dL 8.7     Total Protein 6.5 - 8.1 g/dL 6.2     Total Bilirubin 0.3 - 1.2 mg/dL 1.1     Alkaline Phos 38 - 126 U/L 111     AST 15 - 41 U/L 200     ALT 0 - 44 U/L 222       ECHOCARDIOGRAM COMPLETE  Result Date: 08/10/2022    ECHOCARDIOGRAM REPORT   Patient Name:   [name] [facility] Date of Exam:  08/10/2022 Medical Rec #:  [id number]            Height:       71.0 in Accession #:    [id number]           Weight:       166.0 lb Date of Birth:  June 19, 1966            BSA:          1.948 m  Patient Age:    60 years             BP:           147/114 mmHg Patient Gender: M                    HR:           102 bpm. Exam Location:  Inpatient Procedure: 2D Echo, Cardiac Doppler, Color Doppler and Intracardiac            Opacification Agent Indications:     Congestive Heart Failure I50.9  History:         Patient has prior history of Echocardiogram examinations, most                  recent 05/13/2022. CAD and Previous Myocardial Infarction,                  Signs/Symptoms:Dyspnea; Risk Factors:Dyslipidemia, Hypertension                  and Sleep Apnea.  Sonographer:     Bernadene Person RDCS Referring Phys:  [id number] ERIK C HOFFMAN Diagnosing Phys: [place] IMPRESSIONS  1. Akinesis of the inferior/inferolateral wall from base-mid. Left ventricular ejection fraction, by estimation, is 30 to 35%. The left ventricle has moderately decreased function. The left ventricle demonstrates global hypokinesis. The left ventricular  internal cavity size was moderately dilated. There is mild left ventricular hypertrophy. Left ventricular diastolic parameters are indeterminate.  2. Right ventricular systolic function is low normal. The right ventricular size is normal. There is moderately elevated pulmonary artery systolic pressure.  3. Left atrial size was severely dilated.  4. Functional MR. The mitral valve is grossly normal. Moderate to severe mitral valve regurgitation.  5. Tricuspid valve regurgitation is mild to moderate.  6. The aortic valve is grossly normal. Aortic valve regurgitation is not visualized.  7. The inferior vena cava is normal in size with <50% respiratory variability, suggesting right atrial pressure of 8 mmHg. Conclusion(s)/Recommendation(s): With side by side comparison, EF looks approximately unchanged from prior  study. No significant changes. FINDINGS  Left Ventricle: Akinesis of the inferior/inferolateral wall from base-mid. Left ventricular ejection fraction, by estimation, is 30 to 35%. The left ventricle has moderately decreased function. The left ventricle demonstrates global hypokinesis. Definity contrast agent was given IV to delineate the left ventricular endocardial borders. The left ventricular internal cavity size was moderately dilated. There is mild left ventricular hypertrophy. Left ventricular diastolic parameters are indeterminate. Right Ventricle: The right ventricular size is normal. Right ventricular systolic function is low normal. There is moderately elevated pulmonary artery systolic pressure. The tricuspid regurgitant velocity is 3.08 m/s, and with an assumed right atrial pressure of 8 mmHg, the estimated right ventricular systolic pressure is 56.2 mmHg. Left Atrium: Left atrial size was severely dilated. Right Atrium: Right atrial size was normal in size. Pericardium: Trivial pericardial effusion is present. Mitral Valve: Functional MR. The mitral valve is grossly normal. Moderate to severe mitral valve regurgitation. Tricuspid Valve: The tricuspid valve is normal in structure. Tricuspid valve regurgitation is mild to moderate. Aortic Valve: The aortic valve is grossly normal. Aortic valve regurgitation is not visualized. Pulmonic Valve: The pulmonic valve was normal in structure. Pulmonic valve regurgitation is not visualized. Aorta: The aortic root and ascending aorta are structurally normal, with no evidence of dilitation. Venous: The inferior vena cava is normal in size with less than 50% respiratory variability, suggesting  right atrial pressure of 8 mmHg. IAS/Shunts: No atrial level shunt detected by color flow Doppler.  LEFT VENTRICLE PLAX 2D LVIDd:         6.10 cm LVIDs:         5.70 cm LV PW:         1.10 cm LV IVS:        1.00 cm LVOT diam:     2.10 cm LV SV:         42 LV SV Index:   21  LVOT Area:     3.46 cm  LV Volumes (MOD) LV vol d, MOD A2C: 181.0 ml LV vol d, MOD A4C: 157.0 ml LV vol s, MOD A2C: 145.0 ml LV vol s, MOD A4C: 132.0 ml LV SV MOD A2C:     36.0 ml LV SV MOD A4C:     157.0 ml LV SV MOD BP:      31.6 ml RIGHT VENTRICLE RV S prime:     9.75 cm/s TAPSE (M-mode): 1.7 cm LEFT ATRIUM              Index        RIGHT ATRIUM           Index LA diam:        4.60 cm  2.36 cm/m   RA Area:     20.20 cm LA Vol (A2C):   110.0 ml 56.47 ml/m  RA Volume:   65.20 ml  33.47 ml/m LA Vol (A4C):   96.3 ml  49.43 ml/m LA Biplane Vol: 113.0 ml 58.01 ml/m  AORTIC VALVE LVOT Vmax:   102.13 cm/s LVOT Vmean:  62.267 cm/s LVOT VTI:    0.121 m  AORTA Ao Root diam: 2.80 cm Ao Asc diam:  3.40 cm MR Peak grad:    100.0 mmHg   TRICUSPID VALVE MR Mean grad:    70.0 mmHg    TR Peak grad:   37.9 mmHg MR Vmax:         500.00 cm/s  TR Vmax:        308.00 cm/s MR Vmean:        399.0 cm/s MR PISA:         1.57 cm     SHUNTS MR PISA Eff ROA: 12 mm       Systemic VTI:  0.12 m MR PISA Radius:  0.50 cm      Systemic Diam: 2.10 cm Phineas Inches Electronically signed by Phineas Inches Signature Date/Time: 08/10/2022/2:39:07 PM    Final (Updated)    DG Chest Port 1 View  Result Date: 08/09/2022 CLINICAL DATA:  Cough EXAM: PORTABLE CHEST 1 VIEW COMPARISON:  05/12/2022 chest radiograph. FINDINGS: Low lung volumes. Stable cardiomediastinal silhouette with mild cardiomegaly. No pneumothorax. No pleural effusion. Mild pulmonary edema. IMPRESSION: Low lung volumes. Mild congestive heart failure. Electronically Signed   By: Ilona Sorrel M.D.   On: 08/09/2022 09:54     Discharge Instructions: Discharge Instructions     Call MD for:  difficulty breathing, headache or visual disturbances   Complete by: As directed    Call MD for:  extreme fatigue   Complete by: As directed    Call MD for:  hives   Complete by: As directed    Call MD for:  persistant dizziness or light-headedness   Complete by: As directed    Call MD for:   persistant nausea and vomiting   Complete by: As directed    Call  MD for:  severe uncontrolled pain   Complete by: As directed    Call MD for:  temperature >100.4   Complete by: As directed    Diet - low sodium heart healthy   Complete by: As directed    Increase activity slowly   Complete by: As directed        Signed: Angelique Blonder, DO 08/10/2022, 8:23 PM   Pager: [phone]

## 2022-08-11 LAB — HCV INTERPRETATION

## 2022-08-11 LAB — HCV AB W REFLEX TO QUANT PCR: HCV Ab: NONREACTIVE

## 2022-08-16 ENCOUNTER — Telehealth: Payer: Self-pay | Admitting: Internal Medicine

## 2022-08-16 NOTE — Telephone Encounter (Signed)
Attempted to call pt and offer an appt for Tues 08/16/2022 but no answer and unable to lvm. An SMS notification was sent. Appears to have appt to establish somewhere else scheduled on 08/18/2022.

## 2022-08-31 ENCOUNTER — Other Ambulatory Visit: Payer: Self-pay

## 2022-08-31 ENCOUNTER — Emergency Department (HOSPITAL_COMMUNITY): Payer: Medicaid Other

## 2022-08-31 ENCOUNTER — Observation Stay (HOSPITAL_COMMUNITY)
Admission: EM | Admit: 2022-08-31 | Discharge: 2022-09-19 | Disposition: E | Payer: Medicaid Other | Attending: Internal Medicine | Admitting: Internal Medicine

## 2022-08-31 DIAGNOSIS — Z85038 Personal history of other malignant neoplasm of large intestine: Secondary | ICD-10-CM | POA: Insufficient documentation

## 2022-08-31 DIAGNOSIS — I110 Hypertensive heart disease with heart failure: Secondary | ICD-10-CM | POA: Diagnosis not present

## 2022-08-31 DIAGNOSIS — R079 Chest pain, unspecified: Secondary | ICD-10-CM | POA: Diagnosis not present

## 2022-08-31 DIAGNOSIS — Z955 Presence of coronary angioplasty implant and graft: Secondary | ICD-10-CM | POA: Diagnosis not present

## 2022-08-31 DIAGNOSIS — I469 Cardiac arrest, cause unspecified: Secondary | ICD-10-CM | POA: Diagnosis not present

## 2022-08-31 DIAGNOSIS — Z79899 Other long term (current) drug therapy: Secondary | ICD-10-CM | POA: Insufficient documentation

## 2022-08-31 DIAGNOSIS — Z91010 Allergy to peanuts: Secondary | ICD-10-CM | POA: Diagnosis not present

## 2022-08-31 DIAGNOSIS — Z87891 Personal history of nicotine dependence: Secondary | ICD-10-CM | POA: Diagnosis not present

## 2022-08-31 DIAGNOSIS — J45909 Unspecified asthma, uncomplicated: Secondary | ICD-10-CM | POA: Diagnosis not present

## 2022-08-31 DIAGNOSIS — I2510 Atherosclerotic heart disease of native coronary artery without angina pectoris: Secondary | ICD-10-CM | POA: Insufficient documentation

## 2022-08-31 DIAGNOSIS — I5043 Acute on chronic combined systolic (congestive) and diastolic (congestive) heart failure: Secondary | ICD-10-CM | POA: Insufficient documentation

## 2022-08-31 DIAGNOSIS — G928 Other toxic encephalopathy: Secondary | ICD-10-CM | POA: Diagnosis present

## 2022-08-31 DIAGNOSIS — Z7982 Long term (current) use of aspirin: Secondary | ICD-10-CM | POA: Insufficient documentation

## 2022-08-31 DIAGNOSIS — Z96641 Presence of right artificial hip joint: Secondary | ICD-10-CM | POA: Diagnosis not present

## 2022-08-31 DIAGNOSIS — I509 Heart failure, unspecified: Secondary | ICD-10-CM

## 2022-08-31 LAB — COMPREHENSIVE METABOLIC PANEL
ALT: 66 U/L — ABNORMAL HIGH (ref 0–44)
AST: 101 U/L — ABNORMAL HIGH (ref 15–41)
Albumin: 3.5 g/dL (ref 3.5–5.0)
Alkaline Phosphatase: 160 U/L — ABNORMAL HIGH (ref 38–126)
Anion gap: 14 (ref 5–15)
BUN: 14 mg/dL (ref 6–20)
CO2: 18 mmol/L — ABNORMAL LOW (ref 22–32)
Calcium: 8.8 mg/dL — ABNORMAL LOW (ref 8.9–10.3)
Chloride: 106 mmol/L (ref 98–111)
Creatinine, Ser: 1.45 mg/dL — ABNORMAL HIGH (ref 0.61–1.24)
GFR, Estimated: 57 mL/min — ABNORMAL LOW (ref >60)
Glucose, Bld: 70 mg/dL (ref 70–99)
Potassium: 4.3 mmol/L (ref 3.5–5.1)
Sodium: 138 mmol/L (ref 135–145)
Total Bilirubin: 1.9 mg/dL — ABNORMAL HIGH (ref 0.3–1.2)
Total Protein: 6.8 g/dL (ref 6.5–8.1)

## 2022-08-31 LAB — CBC
HCT: 47.9 % (ref 39.0–52.0)
Hemoglobin: 14.6 g/dL (ref 13.0–17.0)
MCH: 29.1 pg (ref 26.0–34.0)
MCHC: 30.5 g/dL (ref 30.0–36.0)
MCV: 95.6 fL (ref 80.0–100.0)
Platelets: 266 10*3/uL (ref 150–400)
RBC: 5.01 MIL/uL (ref 4.22–5.81)
RDW: 14.9 % (ref 11.5–15.5)
WBC: 5.5 10*3/uL (ref 4.0–10.5)
nRBC: 0.5 % — ABNORMAL HIGH (ref 0.0–0.2)

## 2022-08-31 LAB — I-STAT VENOUS BLOOD GAS, ED
Acid-base deficit: 7 mmol/L — ABNORMAL HIGH (ref 0.0–2.0)
Bicarbonate: 19.5 mmol/L — ABNORMAL LOW (ref 20.0–28.0)
Calcium, Ion: 1 mmol/L — ABNORMAL LOW (ref 1.15–1.40)
HCT: 46 % (ref 39.0–52.0)
Hemoglobin: 15.6 g/dL (ref 13.0–17.0)
O2 Saturation: 98 %
Potassium: 6.1 mmol/L — ABNORMAL HIGH (ref 3.5–5.1)
Sodium: 134 mmol/L — ABNORMAL LOW (ref 135–145)
TCO2: 21 mmol/L — ABNORMAL LOW (ref 22–32)
pCO2, Ven: 41.3 mmHg — ABNORMAL LOW (ref 44–60)
pH, Ven: 7.283 (ref 7.25–7.43)
pO2, Ven: 108 mmHg — ABNORMAL HIGH (ref 32–45)

## 2022-08-31 LAB — CBG MONITORING, ED
Glucose-Capillary: 146 mg/dL — ABNORMAL HIGH (ref 70–99)
Glucose-Capillary: 163 mg/dL — ABNORMAL HIGH (ref 70–99)
Glucose-Capillary: 38 mg/dL — CL (ref 70–99)
Glucose-Capillary: 81 mg/dL (ref 70–99)

## 2022-08-31 LAB — TROPONIN I (HIGH SENSITIVITY)
Troponin I (High Sensitivity): 34 ng/L — ABNORMAL HIGH (ref <18)
Troponin I (High Sensitivity): 29 ng/L — ABNORMAL HIGH (ref <18)

## 2022-08-31 LAB — LIPASE, BLOOD: Lipase: 22 U/L (ref 11–51)

## 2022-08-31 LAB — BRAIN NATRIURETIC PEPTIDE: B Natriuretic Peptide: 506.3 pg/mL — ABNORMAL HIGH (ref 0.0–100.0)

## 2022-08-31 LAB — AMMONIA: Ammonia: 42 umol/L — ABNORMAL HIGH (ref 9–35)

## 2022-08-31 LAB — ETHANOL: Alcohol, Ethyl (B): <10 mg/dL (ref <10)

## 2022-08-31 MED ORDER — NALOXONE HCL 2 MG/2ML IJ SOSY: 1.0000 mg | PREFILLED_SYRINGE | Freq: Once | INTRAMUSCULAR | Status: AC

## 2022-08-31 MED ORDER — FUROSEMIDE 10 MG/ML IJ SOLN
40.0000 mg | Freq: Once | INTRAMUSCULAR | Status: AC
  Administered 2022-08-31: 40 mg via INTRAVENOUS
  Filled 2022-08-31: qty 4

## 2022-08-31 MED ORDER — DEXTROSE 50 % IV SOLN
1.0000 | Freq: Once | INTRAVENOUS | Status: AC
  Administered 2022-08-31: 50 mL via INTRAVENOUS
  Filled 2022-08-31: qty 50

## 2022-08-31 MED ORDER — AMMONIA AROMATIC IN INHA
1.0000 | Freq: Once | RESPIRATORY_TRACT | Status: AC
  Administered 2022-08-31: 1 via RESPIRATORY_TRACT
  Filled 2022-08-31: qty 10

## 2022-08-31 MED ORDER — NALOXONE HCL 2 MG/2ML IJ SOSY
2.0000 mg | PREFILLED_SYRINGE | INTRAMUSCULAR | Status: DC | PRN
  Administered 2022-08-31: 2 mg via INTRAVENOUS
  Filled 2022-08-31: qty 2

## 2022-08-31 MED ORDER — NALOXONE HCL 2 MG/2ML IJ SOSY
PREFILLED_SYRINGE | INTRAMUSCULAR | Status: AC
  Administered 2022-08-31: 1 mg via INTRAVENOUS
  Filled 2022-08-31: qty 2

## 2022-08-31 NOTE — ED Triage Notes (Signed)
Pt arrived to triage complaining of chest pain that has been ongoing for 3 days. States that is started when he woke up a couple of days ago.  Pt states that today pain got worse, points to left chest and abdomen. 10/10  States he has also been having N/V/D

## 2022-08-31 NOTE — ED Provider Triage Note (Signed)
Emergency Medicine Provider Triage Evaluation Note  Kenneth Long , a 56 y.o. male  was evaluated in triage.  Pt complains of generalized abdominal pain and left-sided chest pain x3 days, constant, nothing makes pain better or worse.  Associated with nausea, vomiting, diarrhea.  Reports cardiac history with stents x2.[name] Difficult historian, does not seem to want to answer many questions Review of Systems  Positive: Pain, abdominal pain, nausea, vomiting or diarrhea Negative: For  Physical Exam  BP 118/72 (BP Location: Right Arm)   Pulse 72   Resp 12   Ht '5\' 8"'$  (1.727 m)   Wt 72.6 kg   SpO2 98%   BMI 24.33 kg/m  Gen:   Awake, no distress, peers uncomfortable Resp:  Normal effort  MSK:   Moves extremities without difficulty  Other:  Mild generalized abdominal discomfort  Medical Decision Making  Medically screening exam initiated at 7:25 PM.  Appropriate orders placed.  [name] was informed that the remainder of the evaluation will be completed by another provider, this initial triage assessment does not replace that evaluation, and the importance of remaining in the ED until their evaluation is complete.     [name], PA-C 09/03/2022 1925

## 2022-08-31 NOTE — ED Provider Notes (Signed)
[facility] EMERGENCY DEPARTMENT Provider Note  CSN: [id number] Arrival date & time: 08/28/2022 1908  Chief Complaint(s) Chest Pain  HPI Kenneth Long is a 56 y.o. 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  Patient arrives ill-appearing and somnolent, with an initial CBG of 38 of which he immediately received an amp of D50 and on recheck on the time of my evaluation was 163.  Despite this, patient remains persistently somnolent and is unable to give further history.   Past Medical History Past Medical History:  Diagnosis Date   Allergy    SEASONAL   Arthritis    Asthma    Colon adenomas 08/07/2019   Dr. Ardis Hughs 5 Tubular adenomas without high grade dysplasia.   Coronary artery disease    Dyspnea    "sometimes" with exertion    Headache    Heart murmur    Hyperlipidemia    Hypertension    Sleep apnea    no CPAP   ST elevation (STEMI) myocardial infarction involving left circumflex coronary artery ([place]) 03/16/2016   DES CFX   Umbilical hernia    Patient Active Problem List   Diagnosis Date Noted   Cocaine abuse ([place])    Acute exacerbation of CHF (congestive heart failure) ([place]) 08/09/2022   Acute on chronic combined systolic and diastolic CHF (congestive heart failure) ([place]) 05/12/2022   Elevated troponin 05/12/2022   Asthma, chronic 05/12/2022   Polysubstance abuse ([place]) 11/09/2021   Chest pain with moderate risk for cardiac etiology 11/08/2021   Cardiac arrhythmia 09/24/2020   Colon adenomas 08/07/2019   Stress 07/16/2018   History of hip replacement 08/24/2017   Primary osteoarthritis of right hip 07/11/2017   Coronary artery disease 04/25/2017   Seasonal allergies 04/25/2017   Mixed hyperlipidemia 03/25/2016   Cardiomyopathy, ischemic 03/25/2016   ST  elevation (STEMI) myocardial infarction involving left circumflex coronary artery ([place]) 03/16/2016   Essential hypertension 03/09/2016   Decreased visual acuity 03/09/2016   Dental decay 03/09/2016   Home Medication(s) Prior to Admission medications   Medication Sig Start Date End Date Taking? Authorizing Provider  albuterol (VENTOLIN HFA) 108 (90 Base) MCG/ACT inhaler Inhale 2 puffs into the lungs every 6 (six) hours as needed for wheezing or shortness of breath. 08/10/22   Angelique Blonder, DO  amLODipine (NORVASC) 10 MG tablet Take 1 tablet (10 mg total) by mouth daily. 08/10/22   Angelique Blonder, DO  aspirin EC 81 MG tablet Take 1 tablet (81 mg total) by mouth daily. 08/10/22   Angelique Blonder, DO  atorvastatin (LIPITOR) 80 MG tablet Take 1 tablet (80 mg total) by mouth daily. 08/10/22   Angelique Blonder, DO  ibuprofen (ADVIL) 200 MG tablet Take 200 mg by mouth every 6 (six) hours as needed for mild pain or moderate pain.    [provider]  lisinopril-hydrochlorothiazide (ZESTORETIC) 20-25 MG tablet Take 1 tablet by mouth once daily. 08/10/22   Angelique Blonder, DO  nitroGLYCERIN (NITROSTAT) 0.4 MG SL tablet Place 1 tablet (0.4 mg total) under the tongue every 5 (five) minutes as needed for chest pain. 08/10/22   Angelique Blonder, DO  tamsulosin (FLOMAX) 0.4 MG CAPS capsule Take 1 capsule by mouth at bedtime. 08/10/22   Angelique Blonder, DO  VASCEPA 1 g capsule Take 2 capsules by mouth twice daily 08/10/22   Markus Jarvis,  Legrand Como, DO                                                                                                                                    Past Surgical History Past Surgical History:  Procedure Laterality Date   CARDIAC CATHETERIZATION N/A 03/16/2016   Procedure: Left Heart Cath and Coronary Angiography;  Surgeon: Peter M Martinique, MD;  LAD OK, CFX 100%, OM1 20%, RCA 20%, EF nl   CARDIAC CATHETERIZATION N/A 03/16/2016   Procedure: Coronary Stent Intervention;  Surgeon: Peter M Martinique,  MD; PROMUS PREM MR 3.0X16 mm DES    TOTAL HIP ARTHROPLASTY Right 08/24/2017   Procedure: RIGHT TOTAL HIP ARTHROPLASTY ANTERIOR APPROACH;  Surgeon: Leandrew Koyanagi, MD;  Location: [facility];  Service: Orthopedics;  Laterality: Right;  RIGHT TOTAL HIP ARTHROPLASTY ANTERIOR APPROACH   UMBILICAL HERNIA REPAIR  2951   umbilical hernia repair   Family History Family History  Problem Relation Age of Onset   Hypertension Mother    Sarcoidosis Mother    Hypertension Father    Hypertension Sister    Hypertension Sister    Colon cancer Neg Hx    Colon polyps Neg Hx    Esophageal cancer Neg Hx    Stomach cancer Neg Hx    Rectal cancer Neg Hx     Social History Social History   Tobacco Use   Smoking status: Former    Types: Cigarettes   Smokeless tobacco: Never   Tobacco comments:    socially  Media planner   Vaping Use: Never used  Substance Use Topics   Alcohol use: Yes    Comment: socially   Drug use: Yes    Types: Cocaine    Comment: reports he stopped as soon as he started   Allergies Peanut-containing drug products, Shellfish allergy, and Lexiscan [regadenoson]  Review of Systems Review of Systems  Unable to perform ROS: Mental status change    Physical Exam Vital Signs  I have reviewed the triage vital signs BP 99/86   Pulse 79   Temp (!) 95.1 F (35.1 C) (Temporal)   Resp (!) 26   Ht '5\' 8"'$  (1.727 m)   Wt 72.6 kg   SpO2 92%   BMI 24.33 kg/m   Physical Exam Constitutional:      General: He is not in acute distress.    Appearance: Normal appearance. He is ill-appearing.  HENT:     Head: Normocephalic and atraumatic.     Nose: No congestion or rhinorrhea.  Eyes:     General:        Right eye: No discharge.        Left eye: No discharge.     Extraocular Movements: Extraocular movements intact.     Pupils: Pupils are equal, round, and reactive to light.  Cardiovascular:     Rate and Rhythm: Normal rate and regular rhythm.     Heart  sounds: No murmur  heard. Pulmonary:     Effort: No respiratory distress.     Breath sounds: Rales present. No wheezing.  Abdominal:     General: There is no distension.     Tenderness: There is no abdominal tenderness.  Musculoskeletal:        General: Normal range of motion.     Cervical back: Normal range of motion.  Skin:    General: Skin is warm and dry.  Neurological:     Mental Status: He is alert. He is disoriented.     ED Results and Treatments Labs (all labs ordered are listed, but only abnormal results are displayed) Labs Reviewed  CBC - Abnormal; Notable for the following components:      Result Value   nRBC 0.5 (*)    All other components within normal limits  COMPREHENSIVE METABOLIC PANEL - Abnormal; Notable for the following components:   CO2 18 (*)    Creatinine, Ser 1.45 (*)    Calcium 8.8 (*)    AST 101 (*)    ALT 66 (*)    Alkaline Phosphatase 160 (*)    Total Bilirubin 1.9 (*)    GFR, Estimated 57 (*)    All other components within normal limits  CBG MONITORING, ED - Abnormal; Notable for the following components:   Glucose-Capillary 38 (*)    All other components within normal limits  TROPONIN I (HIGH SENSITIVITY) - Abnormal; Notable for the following components:   Troponin I (High Sensitivity) 34 (*)    All other components within normal limits  LIPASE, BLOOD  BRAIN NATRIURETIC PEPTIDE  ETHANOL  AMMONIA  I-STAT VENOUS BLOOD GAS, ED  TROPONIN I (HIGH SENSITIVITY)                                                                                                                          Radiology DG Chest 2 View  Result Date: 09/18/2022 CLINICAL DATA:  Chest pain EXAM: CHEST - 2 VIEW COMPARISON:  08/09/2022 FINDINGS: Cardiomegaly, vascular congestion. Mild perihilar opacities, favor early edema. No effusions or acute bony abnormality. IMPRESSION: Cardiomegaly with vascular congestion and early perihilar edema/CHF. Electronically Signed   By: Rolm Baptise M.D.   On:  08/29/2022 20:11    Pertinent labs & imaging results that were available during my care of the patient were reviewed by me and considered in my medical decision making (see MDM for details).  Medications Ordered in ED Medications  naloxone [facility]) injection 2 mg (2 mg Intravenous Given 08/24/2022 2118)  dextrose 50 % solution 50 mL (50 mLs Intravenous Given 08/22/2022 2055)  naloxone [facility]) injection 1 mg (1 mg Intravenous Given 08/27/2022 2107)  ammonia inhalant 1 each (1 each Inhalation Given 09/14/2022 2110)  Procedures .Critical Care  Performed by: Teressa Lower, MD Authorized by: Teressa Lower, MD   Critical care provider statement:    Critical care time (minutes):  30   Critical care was necessary to treat or prevent imminent or life-threatening deterioration of the following conditions:  Cardiac failure and respiratory failure   Critical care was time spent personally by me on the following activities:  Development of treatment plan with patient or surrogate, discussions with consultants, evaluation of patient's response to treatment, examination of patient, ordering and review of laboratory studies, ordering and review of radiographic studies, ordering and performing treatments and interventions, pulse oximetry, re-evaluation of patient's condition and review of old charts   (including critical care time)  Medical Decision Making / ED Course   This patient presents to the ED for concern of chest pain, shortness of breath, somnolence, this involves an extensive number of treatment options, and is a complaint that carries with it a high risk of complications and morbidity.  The differential diagnosis includes CHF exacerbation, ACS, polysubstance abuse, hypoglycemia  MDM: Patient seen in the emergency department for evaluation of multiple complaints as  described above.  On initial presentation, patient's somnolent and will only participate in his exam after sternal rub.  Initial blood sugar 38 and was given an amp of D50 with immediate correction but patient's mental status really did not improve.  He was given a total of 3 mg of IV Narcan with improvement of his mental status but patient did remain minimally somnolent.  An ammonia inhalant was used due to concern for additional substance induced somnolence and the patient did reawakened after using this.  On reevaluation, he was able to get out of bed and use the restroom via bedside commode.  Patient noted to be hypothermic and external rewarming begun with warm blankets.  He remained on 3 L supplemental oxygen throughout this time.  Initial laboratory evaluation with a normal CBC, minimally elevated troponin to 34 with delta troponin downtrending to 29, CO2 18, creatinine 1.45, AST 101, ALT 66 which is downtrending from previous, BNP 5 was 6.3 which is minimally downtrending from 3 weeks ago, ethanol negative, ammonia minimally elevated at 42.  ECG with no new evidence of active ischemia and looks fairly similar to his ECG on 08/10/2022.  Second recheck of his blood sugar was 146.  Due to patient's persistent somnolence, an i-STAT VBG was obtained that showed some mild acidosis at 7.28 but PCO2 was normal.  Chest x-ray was obtained that shows early CHF exacerbation and Lasix administered.  On second reevaluation, patient remains improved from initial presentation and was admitted to the hospital service as a CHF exacerbation.   Additional history obtained:  -External records from outside source obtained and reviewed including: Chart review including previous notes, labs, imaging, consultation notes   Lab Tests: -I ordered, reviewed, and interpreted labs.   The pertinent results include:   Labs Reviewed  CBC - Abnormal; Notable for the following components:      Result Value   nRBC 0.5 (*)    All  other components within normal limits  COMPREHENSIVE METABOLIC PANEL - Abnormal; Notable for the following components:   CO2 18 (*)    Creatinine, Ser 1.45 (*)    Calcium 8.8 (*)    AST 101 (*)    ALT 66 (*)    Alkaline Phosphatase 160 (*)    Total Bilirubin 1.9 (*)    GFR, Estimated 57 (*)    All  other components within normal limits  CBG MONITORING, ED - Abnormal; Notable for the following components:   Glucose-Capillary 38 (*)    All other components within normal limits  TROPONIN I (HIGH SENSITIVITY) - Abnormal; Notable for the following components:   Troponin I (High Sensitivity) 34 (*)    All other components within normal limits  LIPASE, BLOOD  BRAIN NATRIURETIC PEPTIDE  ETHANOL  AMMONIA  I-STAT VENOUS BLOOD GAS, ED  TROPONIN I (HIGH SENSITIVITY)      EKG   EKG Interpretation  Date/Time:  Tuesday August 31 2022 19:22:19 EDT Ventricular Rate:  89 PR Interval:  150 QRS Duration: 86 QT Interval:  432 QTC Calculation: 525 R Axis:   263 Text Interpretation: Normal sinus rhythm Possible Left atrial enlargement Right superior axis deviation Right ventricular hypertrophy T wave abnormality, consider lateral ischemia Prolonged QT Abnormal ECG When compared with ECG of 10-Aug-2022 10:37, PREVIOUS ECG IS PRESENT Confirmed by Zyah Gomm ([id number]) on 09/09/2022 9:18:11 PM         Imaging Studies ordered: I ordered imaging studies including chest x-ray I independently visualized and interpreted imaging. I agree with the radiologist interpretation   Medicines ordered and prescription drug management: Meds ordered this encounter  Medications   dextrose 50 % solution 50 mL   naloxone (NARCAN) injection 1 mg   naloxone (NARCAN) 2 MG/2ML injection    Verta Ellen N: cabinet override   ammonia inhalant 1 each   naloxone (NARCAN) injection 2 mg    -I have reviewed the patients home medicines and have made adjustments as needed  Critical interventions Hypoglycemia  correction, external oxygen supplementation   Cardiac Monitoring: The patient was maintained on a cardiac monitor.  I personally viewed and interpreted the cardiac monitored which showed an underlying rhythm of: Normal sinus rhythm  Social Determinants of Health:  Factors impacting patients care include: History of polysubstance use   Reevaluation: After the interventions noted above, I reevaluated the patient and found that they have :improved  Co morbidities that complicate the patient evaluation  Past Medical History:  Diagnosis Date   Allergy    SEASONAL   Arthritis    Asthma    Colon adenomas 08/07/2019   Dr. Ardis Hughs 5 Tubular adenomas without high grade dysplasia.   Coronary artery disease    Dyspnea    "sometimes" with exertion    Headache    Heart murmur    Hyperlipidemia    Hypertension    Sleep apnea    no CPAP   ST elevation (STEMI) myocardial infarction involving left circumflex coronary artery ([place]) 03/16/2016   DES CFX   Umbilical hernia       Dispostion: I considered admission for this patient, and due to concerning hypoglycemia medicines been corrected and current CHF exacerbation, patient require hospital admission     Final Clinical Impression(s) / ED Diagnoses Final diagnoses:  None     '@PCDICTATION'$ @    Teressa Lower, MD 09/12/22 1545

## 2022-08-31 NOTE — ED Notes (Signed)
Noted patient transferred himself from wheelchair into reclined position sideways in a lobby chair. Approached patient to offer recliner and noted diaphoretic and requiring painful stimulus to speak with RN. CBG obtained, '38mg'$ /dL, brought to triage and 20G R AC started, D50 admin (see MAR). Charge notified of condition change and pt escorted to room.

## 2022-08-31 NOTE — ED Notes (Signed)
ED TO INPATIENT HANDOFF REPORT  ED Nurse Name and Phone #: Elicia Lui [phone]  S Name/Age/Gender Kenneth Long 56 y.o. male Room/Bed: TRACC/TRACC  Code Status   Code Status: Prior  Home/SNF/Other Home Patient oriented to: self, place, time, and situation Is this baseline? Yes   Triage Complete: Triage complete  Chief Complaint Toxic metabolic encephalopathy [G99.2]  Triage Note Pt arrived to triage complaining of chest pain that has been ongoing for 3 days. States that is started when he woke up a couple of days ago.  Pt states that today pain got worse, points to left chest and abdomen. 10/10  States he has also been having N/V/D     Allergies Allergies  Allergen Reactions   Peanut-Containing Drug Products Anaphylaxis and Swelling   Shellfish Allergy Anaphylaxis and Swelling   Lexiscan [Regadenoson] Other (See Comments)    Via IV- "tht iritation with a myocardial perf study 09-13-2018"    Level of Care/Admitting Diagnosis ED Disposition     ED Disposition  Admit   Condition  --   [place] [place] [100100]  Level of Care: Progressive [102]  Admit to Progressive based on following criteria: ACUTE MENTAL DISORDER-RELATED Drug/Alcohol Ingestion/Overdose/Withdrawal, Suicidal Ideation/attempt requiring safety sitter and < Q2h monitoring/assessments, moderate to severe agitation that is managed with medication/sitter, CIWA-Ar score < 20.  May place patient in observation at [facility] or [place] if equivalent level of care is available:: No  Covid Evaluation: Asymptomatic - no recent exposure (last 10 days) testing not required  Diagnosis: Toxic metabolic encephalopathy [426834]  Admitting Physician: Vernelle Emerald [[id number]]  Attending Physician: Vernelle Emerald [[id number]]          B Medical/Surgery History Past Medical History:  Diagnosis Date   Allergy    SEASONAL   Arthritis    Asthma    Colon adenomas  08/07/2019   Dr. Ardis Hughs 5 Tubular adenomas without high grade dysplasia.   Coronary artery disease    Dyspnea    "sometimes" with exertion    Headache    Heart murmur    Hyperlipidemia    Hypertension    Sleep apnea    no CPAP   ST elevation (STEMI) myocardial infarction involving left circumflex coronary artery ([place]) 03/16/2016   DES CFX   Umbilical hernia    Past Surgical History:  Procedure Laterality Date   CARDIAC CATHETERIZATION N/A 03/16/2016   Procedure: Left Heart Cath and Coronary Angiography;  Surgeon: Peter M Martinique, MD;  LAD OK, CFX 100%, OM1 20%, RCA 20%, EF nl   CARDIAC CATHETERIZATION N/A 03/16/2016   Procedure: Coronary Stent Intervention;  Surgeon: Peter M Martinique, MD; PROMUS PREM MR 3.0X16 mm DES    TOTAL HIP ARTHROPLASTY Right 08/24/2017   Procedure: RIGHT TOTAL HIP ARTHROPLASTY ANTERIOR APPROACH;  Surgeon: Leandrew Koyanagi, MD;  Location: [place];  Service: Orthopedics;  Laterality: Right;  RIGHT TOTAL HIP ARTHROPLASTY ANTERIOR APPROACH   UMBILICAL HERNIA REPAIR  9417   umbilical hernia repair     A IV Location/Drains/Wounds Patient Lines/Drains/Airways Status     Active Line/Drains/Airways     Name Placement date Placement time Site Days   Peripheral IV 09/12/2022 20 G Anterior;Distal;Right;Upper Arm 08/30/2022  2055  Arm  less than 1   Incision (Closed) 08/24/17 Hip Right 08/24/17  1537  -- 1833            Intake/Output Last 24 hours No intake or output data in the 24  hours ending 08/30/2022 2334  Labs/Imaging Results for orders placed or performed during the hospital encounter of 08/29/2022 (from the past 48 hour(s))  CBC     Status: Abnormal   Collection Time: 08/25/2022  7:35 PM  Result Value Ref Range   WBC 5.5 4.0 - 10.5 K/uL   RBC 5.01 4.22 - 5.81 MIL/uL   Hemoglobin 14.6 13.0 - 17.0 g/dL   HCT 47.9 39.0 - 52.0 %   MCV 95.6 80.0 - 100.0 fL   MCH 29.1 26.0 - 34.0 pg   MCHC 30.5 30.0 - 36.0 g/dL   RDW 14.9 11.5 - 15.5 %   Platelets 266 150 - 400 K/uL    nRBC 0.5 (H) 0.0 - 0.2 %    Comment: Performed at [facility], [place] [address] [place], [place] [address]  Troponin I (High Sensitivity)     Status: Abnormal   Collection Time: 09/08/2022  7:35 PM  Result Value Ref Range   Troponin I (High Sensitivity) 34 (H) <18 ng/L    Comment: (NOTE) Elevated high sensitivity troponin I (hsTnI) values and significant  changes across serial measurements may suggest ACS but many other  chronic and acute conditions are known to elevate hsTnI results.  Refer to the "Links" section for chest pain algorithms and additional  guidance. Performed at [facility], [place] [address] [place], [place] [address]   Comprehensive metabolic panel     Status: Abnormal   Collection Time: 08/20/2022  7:35 PM  Result Value Ref Range   Sodium 138 135 - 145 mmol/L   Potassium 4.3 3.5 - 5.1 mmol/L   Chloride 106 98 - 111 mmol/L   CO2 18 (L) 22 - 32 mmol/L   Glucose, Bld 70 70 - 99 mg/dL    Comment: Glucose reference range applies only to samples taken after fasting for at least 8 hours.   BUN 14 6 - 20 mg/dL   Creatinine, Ser 1.45 (H) 0.61 - 1.24 mg/dL   Calcium 8.8 (L) 8.9 - 10.3 mg/dL   Total Protein 6.8 6.5 - 8.1 g/dL   Albumin 3.5 3.5 - 5.0 g/dL   AST 101 (H) 15 - 41 U/L   ALT 66 (H) 0 - 44 U/L   Alkaline Phosphatase 160 (H) 38 - 126 U/L   Total Bilirubin 1.9 (H) 0.3 - 1.2 mg/dL   GFR, Estimated 57 (L) >60 mL/min    Comment: (NOTE) Calculated using the CKD-EPI Creatinine Equation (2021)    Anion gap 14 5 - 15    Comment: Performed at [facility], [place] [address] [place], [place] [address]  Lipase, blood     Status: None   Collection Time: 08/30/2022  7:35 PM  Result Value Ref Range   Lipase 22 11 - 51 U/L    Comment: Performed at [facility] [address], [place], [place] [address]  Brain natriuretic peptide     Status: Abnormal   Collection Time: 09/11/2022  7:35 PM  Result Value Ref Range   B Natriuretic Peptide 506.3 (H) 0.0 -  100.0 pg/mL    Comment: Performed at [facility] [address], [place],  [address]  CBG monitoring, ED     Status: Abnormal   Collection Time: 09/17/2022  8:46 PM  Result Value Ref Range   Glucose-Capillary 38 (LL) 70 - 99 mg/dL    Comment: Glucose reference range applies only to samples taken after fasting for at  least 8 hours.  CBG monitoring, ED     Status: Abnormal   Collection Time: 09/03/2022  9:04 PM  Result Value Ref Range   Glucose-Capillary 163 (H) 70 - 99 mg/dL    Comment: Glucose reference range applies only to samples taken after fasting for at least 8 hours.  Troponin I (High Sensitivity)     Status: Abnormal   Collection Time: 09/13/2022  9:15 PM  Result Value Ref Range   Troponin I (High Sensitivity) 29 (H) <18 ng/L    Comment: (NOTE) Elevated high sensitivity troponin I (hsTnI) values and significant  changes across serial measurements may suggest ACS but many other  chronic and acute conditions are known to elevate hsTnI results.  Refer to the "Links" section for chest pain algorithms and additional  guidance. Performed at [facility], [place] [address], [place], [place] [address]   Ethanol     Status: None   Collection Time: 09/06/2022  9:15 PM  Result Value Ref Range   Alcohol, Ethyl (B) <10 <10 mg/dL    Comment: (NOTE) Lowest detectable limit for serum alcohol is 10 mg/dL.  For medical purposes only. Performed at [facility], [place] [address] [place], [place] [address]   Ammonia     Status: Abnormal   Collection Time: 09/16/2022  9:15 PM  Result Value Ref Range   Ammonia 42 (H) 9 - 35 umol/L    Comment: Performed at [facility], [place] [address], [place], [place] [address]  CBG monitoring, ED     Status: Abnormal   Collection Time: 09/09/2022  9:22 PM  Result Value Ref Range   Glucose-Capillary 146 (H) 70 - 99 mg/dL    Comment: Glucose reference range applies only to samples taken after fasting for at least 8 hours.  I-Stat venous  blood gas, [facility] ED only)     Status: Abnormal   Collection Time: 09/07/2022 10:23 PM  Result Value Ref Range   pH, Ven 7.283 7.25 - 7.43   pCO2, Ven 41.3 (L) 44 - 60 mmHg   pO2, Ven 108 (H) 32 - 45 mmHg   Bicarbonate 19.5 (L) 20.0 - 28.0 mmol/L   TCO2 21 (L) 22 - 32 mmol/L   O2 Saturation 98 %   Acid-base deficit 7.0 (H) 0.0 - 2.0 mmol/L   Sodium 134 (L) 135 - 145 mmol/L   Potassium 6.1 (H) 3.5 - 5.1 mmol/L   Calcium, Ion 1.00 (L) 1.15 - 1.40 mmol/L   HCT 46.0 39.0 - 52.0 %   Hemoglobin 15.6 13.0 - 17.0 g/dL   Sample type VENOUS   CBG monitoring, ED     Status: None   Collection Time: 09/03/2022 11:08 PM  Result Value Ref Range   Glucose-Capillary 81 70 - 99 mg/dL    Comment: Glucose reference range applies only to samples taken after fasting for at least 8 hours.   DG Chest 2 View  Result Date: 08/22/2022 CLINICAL DATA:  Chest pain EXAM: CHEST - 2 VIEW COMPARISON:  08/09/2022 FINDINGS: Cardiomegaly, vascular congestion. Mild perihilar opacities, favor early edema. No effusions or acute bony abnormality. IMPRESSION: Cardiomegaly with vascular congestion and early perihilar edema/CHF. Electronically Signed   By: Rolm Baptise M.D.   On: 09/05/2022 20:11    Pending Labs Unresulted Labs (From admission, onward)     Start     Ordered   08/21/2022 2120  Rapid urine drug screen (hospital performed)  ONCE - STAT,   STAT  08/26/2022 2119            Vitals/Pain Today's Vitals   09/07/2022 2200 08/20/2022 2210 09/14/2022 2214 08/23/2022 2245  BP: 94/74 (!) 104/94  (!) 110/96  Pulse: 81 81 82 85  Resp:  (!) '9 11 12  '$ Temp:      TempSrc:      SpO2: 100% 95% 100% 99%  Weight:      Height:      PainSc:        Isolation Precautions No active isolations  Medications Medications  naloxone (NARCAN) injection 2 mg (2 mg Intravenous Given 09/18/2022 2118)  dextrose 50 % solution 50 mL (50 mLs Intravenous Given 09/16/2022 2055)  naloxone [facility]) injection 1 mg (1 mg Intravenous Given 08/28/2022  2107)  ammonia inhalant 1 each (1 each Inhalation Given 08/23/2022 2110)  furosemide (LASIX) injection 40 mg (40 mg Intravenous Given 09/13/2022 2208)    Mobility walks with person assist Low fall risk     R Recommendations: See Admitting Provider Note  Report given to:   Additional Notes:

## 2022-08-31 NOTE — ED Notes (Signed)
Pt is alert, speaking on phone.

## 2022-09-01 LAB — CBG MONITORING, ED
Glucose-Capillary: 123 mg/dL — ABNORMAL HIGH (ref 70–99)
Glucose-Capillary: 42 mg/dL — CL (ref 70–99)

## 2022-09-01 MED ORDER — EPINEPHRINE 1 MG/10ML IJ SOSY
PREFILLED_SYRINGE | INTRAMUSCULAR | Status: AC | PRN
  Administered 2022-09-01 (×3): 1 mg via INTRAVENOUS

## 2022-09-01 MED ORDER — DEXTROSE 50 % IV SOLN
INTRAVENOUS | Status: AC
  Filled 2022-09-01: qty 50

## 2022-09-01 MED ORDER — DEXTROSE 50 % IV SOLN
1.0000 | Freq: Once | INTRAVENOUS | Status: AC
  Administered 2022-09-01: 50 mL via INTRAVENOUS

## 2022-09-01 MED ORDER — CALCIUM CHLORIDE 10 % IV SOLN
INTRAVENOUS | Status: AC | PRN
  Administered 2022-09-01 (×2): 1 g via INTRAVENOUS

## 2022-09-01 MED ORDER — EPINEPHRINE 1 MG/10ML IJ SOSY
PREFILLED_SYRINGE | INTRAMUSCULAR | Status: AC
  Filled 2022-09-01: qty 30

## 2022-09-01 MED ORDER — EPINEPHRINE 1 MG/10ML IJ SOSY
PREFILLED_SYRINGE | INTRAMUSCULAR | Status: AC
  Filled 2022-09-01: qty 10

## 2022-09-01 MED ORDER — SODIUM BICARBONATE 8.4 % IV SOLN
INTRAVENOUS | Status: AC | PRN
  Administered 2022-09-01: 50 meq via INTRAVENOUS

## 2022-09-01 MED ORDER — EPINEPHRINE 1 MG/10ML IJ SOSY
PREFILLED_SYRINGE | INTRAMUSCULAR | Status: AC | PRN
  Administered 2022-09-01 (×4): 1 mg via INTRAVENOUS

## 2022-09-19 NOTE — ED Notes (Signed)
CBG 42 - verbal order from Dr. Ralene Bathe for amp of d50.

## 2022-09-19 NOTE — ED Notes (Signed)
Pt noted to become more agitated and have tonic seizure like activity witnessed by this RN. MD at beside. Pt noted to have no pulses. CPR initiated at 0035.

## 2022-09-19 NOTE — Code Documentation (Signed)
Patient time of death occurred at 18

## 2022-09-19 NOTE — Progress Notes (Signed)
HOSPITAL MEDICINE OVERNIGHT EVENT NOTE    Called to patient's bedside after being notified that the patient had suddenly developed cardiac arrest.  ACLS initiated approximately 12:34 AM.  Patient underwent cardiopulmonary resuscitative efforts with high-quality chest compressions, multiple doses of epinephrine, multiple doses of sodium bicarb and administration of D50.  Patient remained in PEA arrest throughout the entire incident.  Bedside ultrasound revealed no cardiac activity.  It is also worth noting that the patient exhibited significant amount of bleeding from the oropharynx although patient was still able to be bagged without difficulty throughout the effort.  The code team was unable to achieve ROSC and therefore cardiopulmonary resuscitative efforts ceased with time of death being called at 12:57 AM.  Dr. Ralene Bathe was the primary physician running the code and therefore will complete the code sheet and code note.  Chart reviewed, patient suffered from significantly depressed ejection fraction secondary to cardiomyopathy with evidence of global hypokinesis on last echocardiogram in late August with ejection fraction of 30 to 35%.  Patient was hospitalized in late August in similar fashion  I called the primary point of contact Duard Larsen, the patient's sister and informed her of this unfortunate news.  Mortality services will be notified as well as the Medical Examiner's Office since patient is felt to have expired secondary to acute congestive heart failure likely due to ongoing cocaine abuse.  Since patient arrived in an altered state.  Vernelle Emerald  MD [facility]

## 2022-09-19 NOTE — ED Provider Notes (Signed)
Called to bedside for patient being unresponsive.  Patient with recent hypoglycemia that was treated with D50.  At time of assessment patient bradycardic, apneic.  No pulse on pulse check and CPR was initiated.  Please see nursing note for details of code.  Respiratory therapy was available to assist with BVM ventilation.  Partially through code patient did have blood return with BVM.  A broken pair of dentures were removed from the patient's mouth.  There was good air movement bilaterally with bagged ventilations.  Suspect intraoral trauma occurred after patient's cardiac event during a BVM process.  There were present bilateral femoral pulses with CPR.  Absent femoral pulses during pulse checks.  Initially patient with PEA on the monitor followed by asystole.  He was treated with multiple rounds of epinephrine, additional D50, bicarb, calcium.  Bedside ultrasound demonstrated cardiac standstill, no significant pericardial effusion.  Time of death 5  CRITICAL CARE Performed by: Quintella Reichert   Total critical care time: 35 minutes  Critical care time was exclusive of separately billable procedures and treating other patients.  Critical care was necessary to treat or prevent imminent or life-threatening deterioration.  Critical care was time spent personally by me on the following activities: development of treatment plan with patient and/or surrogate as well as nursing, discussions with consultants, evaluation of patient's response to treatment, examination of patient, obtaining history from patient or surrogate, ordering and performing treatments and interventions, ordering and review of laboratory studies, ordering and review of radiographic studies, pulse oximetry and re-evaluation of patient's condition.    Quintella Reichert, MD 09-27-2022 [phone]

## 2022-09-19 DEATH — deceased

## 2022-09-23 ENCOUNTER — Ambulatory Visit: Payer: Medicaid Other | Admitting: Internal Medicine
